# Patient Record
Sex: Female | Born: 1955 | Race: White | Hispanic: No | Smoking: Former smoker
Health system: Southern US, Community
[De-identification: ages and names within clinical notes are randomized; demographics above are authoritative.]

## PROBLEM LIST (undated history)

## (undated) DIAGNOSIS — F32A Depression, unspecified: Secondary | ICD-10-CM

## (undated) DIAGNOSIS — I10 Essential (primary) hypertension: Secondary | ICD-10-CM

## (undated) DIAGNOSIS — F419 Anxiety disorder, unspecified: Secondary | ICD-10-CM

## (undated) DIAGNOSIS — K219 Gastro-esophageal reflux disease without esophagitis: Secondary | ICD-10-CM

## (undated) DIAGNOSIS — E785 Hyperlipidemia, unspecified: Secondary | ICD-10-CM

## (undated) DIAGNOSIS — C801 Malignant (primary) neoplasm, unspecified: Secondary | ICD-10-CM

## (undated) DIAGNOSIS — F329 Major depressive disorder, single episode, unspecified: Secondary | ICD-10-CM

## (undated) HISTORY — PX: THUMB ARTHROSCOPY: SHX2509

## (undated) HISTORY — PX: OTHER SURGICAL HISTORY: SHX169

## (undated) HISTORY — DX: Gastro-esophageal reflux disease without esophagitis: K21.9

## (undated) HISTORY — DX: Depression, unspecified: F32.A

## (undated) HISTORY — PX: DILATION AND CURETTAGE OF UTERUS: SHX78

## (undated) HISTORY — DX: Hyperlipidemia, unspecified: E78.5

## (undated) HISTORY — PX: BREAST ENHANCEMENT SURGERY: SHX7

## (undated) HISTORY — DX: Anxiety disorder, unspecified: F41.9

## (undated) HISTORY — PX: TONSILLECTOMY: SUR1361

## (undated) HISTORY — PX: COLONOSCOPY: SHX174

## (undated) HISTORY — DX: Malignant (primary) neoplasm, unspecified: C80.1

## (undated) HISTORY — PX: APPENDECTOMY: SHX54

## (undated) HISTORY — DX: Essential (primary) hypertension: I10

## (undated) HISTORY — DX: Major depressive disorder, single episode, unspecified: F32.9

---

## 2000-12-04 HISTORY — PX: UPPER GASTROINTESTINAL ENDOSCOPY: SHX188

## 2003-10-02 ENCOUNTER — Other Ambulatory Visit: Admission: RE | Admit: 2003-10-02 | Discharge: 2003-10-02 | Payer: Self-pay | Admitting: Obstetrics and Gynecology

## 2004-10-13 ENCOUNTER — Other Ambulatory Visit: Admission: RE | Admit: 2004-10-13 | Discharge: 2004-10-13 | Payer: Self-pay | Admitting: Obstetrics and Gynecology

## 2004-12-16 ENCOUNTER — Encounter (INDEPENDENT_AMBULATORY_CARE_PROVIDER_SITE_OTHER): Payer: Self-pay | Admitting: *Deleted

## 2004-12-16 ENCOUNTER — Ambulatory Visit (HOSPITAL_COMMUNITY): Admission: RE | Admit: 2004-12-16 | Discharge: 2004-12-16 | Payer: Self-pay | Admitting: Obstetrics and Gynecology

## 2005-01-27 ENCOUNTER — Ambulatory Visit: Payer: Self-pay | Admitting: Cardiology

## 2005-02-05 ENCOUNTER — Encounter: Admission: RE | Admit: 2005-02-05 | Discharge: 2005-02-05 | Payer: Self-pay | Admitting: Orthopedic Surgery

## 2005-03-10 ENCOUNTER — Ambulatory Visit: Payer: Self-pay | Admitting: Cardiology

## 2005-08-24 ENCOUNTER — Encounter (INDEPENDENT_AMBULATORY_CARE_PROVIDER_SITE_OTHER): Payer: Self-pay | Admitting: Specialist

## 2005-08-24 ENCOUNTER — Ambulatory Visit (HOSPITAL_COMMUNITY): Admission: RE | Admit: 2005-08-24 | Discharge: 2005-08-24 | Payer: Self-pay | Admitting: Gastroenterology

## 2005-10-17 ENCOUNTER — Inpatient Hospital Stay (HOSPITAL_COMMUNITY): Admission: EM | Admit: 2005-10-17 | Discharge: 2005-10-18 | Payer: Self-pay | Admitting: Emergency Medicine

## 2005-10-25 ENCOUNTER — Other Ambulatory Visit: Admission: RE | Admit: 2005-10-25 | Discharge: 2005-10-25 | Payer: Self-pay | Admitting: Obstetrics and Gynecology

## 2006-03-06 ENCOUNTER — Ambulatory Visit: Payer: Self-pay | Admitting: Cardiology

## 2006-03-07 ENCOUNTER — Ambulatory Visit: Payer: Self-pay | Admitting: Cardiology

## 2007-03-19 ENCOUNTER — Ambulatory Visit: Payer: Self-pay | Admitting: Cardiology

## 2007-03-21 ENCOUNTER — Ambulatory Visit: Payer: Self-pay | Admitting: Cardiology

## 2007-03-21 LAB — CONVERTED CEMR LAB
ALT: 26 units/L (ref 0–40)
AST: 31 units/L (ref 0–37)
Albumin: 4.1 g/dL (ref 3.5–5.2)
Alkaline Phosphatase: 50 units/L (ref 39–117)
Bilirubin, Direct: 0.2 mg/dL (ref 0.0–0.3)
Cholesterol: 146 mg/dL (ref 0–200)
HDL: 52.4 mg/dL (ref >39.0)
LDL Cholesterol: 80 mg/dL (ref 0–99)
Total Bilirubin: 1.4 mg/dL — ABNORMAL HIGH (ref 0.3–1.2)
Total CHOL/HDL Ratio: 2.8
Total Protein: 7.1 g/dL (ref 6.0–8.3)
Triglycerides: 66 mg/dL (ref 0–149)
VLDL: 13 mg/dL (ref 0–40)

## 2008-07-02 ENCOUNTER — Ambulatory Visit: Payer: Self-pay | Admitting: Cardiology

## 2008-07-02 LAB — CONVERTED CEMR LAB
ALT: 16 units/L (ref 0–35)
AST: 25 units/L (ref 0–37)
Albumin: 4.3 g/dL (ref 3.5–5.2)
Alkaline Phosphatase: 47 units/L (ref 39–117)
Bilirubin, Direct: 0.2 mg/dL (ref 0.0–0.3)
Cholesterol: 128 mg/dL (ref 0–200)
HDL: 48.5 mg/dL (ref >39.0)
LDL Cholesterol: 68 mg/dL (ref 0–99)
Total Bilirubin: 1.7 mg/dL — ABNORMAL HIGH (ref 0.3–1.2)
Total CHOL/HDL Ratio: 2.6
Total Protein: 7 g/dL (ref 6.0–8.3)
Triglycerides: 59 mg/dL (ref 0–149)
VLDL: 12 mg/dL (ref 0–40)

## 2009-04-19 ENCOUNTER — Encounter (INDEPENDENT_AMBULATORY_CARE_PROVIDER_SITE_OTHER): Payer: Self-pay | Admitting: *Deleted

## 2009-05-04 ENCOUNTER — Telehealth: Payer: Self-pay | Admitting: Cardiology

## 2009-06-25 DIAGNOSIS — E785 Hyperlipidemia, unspecified: Secondary | ICD-10-CM | POA: Insufficient documentation

## 2009-07-02 ENCOUNTER — Ambulatory Visit: Payer: Self-pay | Admitting: Cardiology

## 2009-09-03 ENCOUNTER — Telehealth (INDEPENDENT_AMBULATORY_CARE_PROVIDER_SITE_OTHER): Payer: Self-pay | Admitting: *Deleted

## 2009-09-03 ENCOUNTER — Encounter (INDEPENDENT_AMBULATORY_CARE_PROVIDER_SITE_OTHER): Payer: Self-pay | Admitting: *Deleted

## 2010-06-01 ENCOUNTER — Telehealth: Payer: Self-pay | Admitting: Cardiology

## 2010-06-29 ENCOUNTER — Ambulatory Visit: Payer: Self-pay | Admitting: Cardiology

## 2010-07-04 ENCOUNTER — Ambulatory Visit: Payer: Self-pay | Admitting: Cardiology

## 2010-07-06 LAB — CONVERTED CEMR LAB
ALT: 18 units/L (ref 0–35)
AST: 23 units/L (ref 0–37)
Albumin: 4.4 g/dL (ref 3.5–5.2)
Alkaline Phosphatase: 56 units/L (ref 39–117)
Bilirubin, Direct: 0.3 mg/dL (ref 0.0–0.3)
Cholesterol: 121 mg/dL (ref 0–200)
HDL: 47 mg/dL (ref >39.00)
LDL Cholesterol: 62 mg/dL (ref 0–99)
Total Bilirubin: 1.9 mg/dL — ABNORMAL HIGH (ref 0.3–1.2)
Total CHOL/HDL Ratio: 3
Total Protein: 7 g/dL (ref 6.0–8.3)
Triglycerides: 60 mg/dL (ref 0.0–149.0)
VLDL: 12 mg/dL (ref 0.0–40.0)

## 2010-12-24 ENCOUNTER — Encounter: Payer: Self-pay | Admitting: Internal Medicine

## 2011-01-01 LAB — CONVERTED CEMR LAB
ALT: 15 units/L (ref 0–35)
AST: 20 units/L (ref 0–37)
Albumin: 4.7 g/dL (ref 3.5–5.2)
Alkaline Phosphatase: 54 units/L (ref 39–117)
Bilirubin, Direct: 0.5 mg/dL — ABNORMAL HIGH (ref 0.0–0.3)
Cholesterol: 119 mg/dL (ref 0–200)
HDL: 57 mg/dL (ref >39)
Indirect Bilirubin: 1.3 mg/dL — ABNORMAL HIGH (ref 0.0–0.9)
LDL Cholesterol: 51 mg/dL (ref 0–99)
Total Bilirubin: 1.8 mg/dL — ABNORMAL HIGH (ref 0.3–1.2)
Total CHOL/HDL Ratio: 2.1
Total Protein: 7.6 g/dL (ref 6.0–8.3)
Triglycerides: 55 mg/dL (ref <150)
VLDL: 11 mg/dL (ref 0–40)

## 2011-01-05 NOTE — Assessment & Plan Note (Signed)
Summary: F 12 MONTHS   Visit Type:  1 yr f/u Primary Provider:  Clydell Hakim  CC:  no cardiac complaints today.  History of Present Illness: Mrs Hummel returns today for evaluation and management of her hyperlipidemia and family history of coronary disease.  She continues on Vytorin. Her numbers of her HDL.  She's having no angina or ischemic symptoms. Her biggest concern is that she continues to slowly gain weight. She's gained 4 pounds since last year prior records.  She exercises regularly except in the heat. She is very careful with her diet was reviewed at length today. She is very compliant with her medications.  Current Medications (verified): 1)  Vytorin 10-20 Mg Tabs (Ezetimibe-Simvastatin) .Marland Kitchen.. 1 Tab At Bedtime 2)  Fish Oil 1000 Mg Caps (Omega-3 Fatty Acids) .... 2 Caps Once Daily 3)  Glucosamine Sulfate 500 Mg Tabs (Glucosamine Sulfate) .... 2 Tab Once Daily 4)  Vivelle-Dot 0.05 Mg/24hr Pttw (Estradiol) .... Change Patch 2 X Weekly 5)  Prometrium 100 Mg Caps (Progesterone Micronized) .Marland Kitchen.. 1 Cap Once Daily 6)  Citracal/vitamin D 250-200 Mg-Unit Tabs (Calcium Citrate-Vitamin D) .... 2 Tab Once Daily 7)  Iron 28 Mg Tabs (Ferrous Sulfate) .Marland Kitchen.. 1 Tab Once Daily 8)  Biotin 5000 5 Mg Caps (Biotin) .Marland Kitchen.. 1 Cap Once Daily 9)  Prilosec Otc 20 Mg Tbec (Omeprazole Magnesium) .Marland Kitchen.. 1 Tab Once Daily  Allergies: 1)  Codeine Phosphate (Codeine Phosphate) 2)  Darvocet A500 3)  Percocet (Oxycodone-Acetaminophen) 4)  Sulfamethoxazole (Sulfamethoxazole)  Review of Systems       negative history of present illness  Vital Signs:  Patient profile:   55 year old female Height:      65 inches Weight:      121 pounds BMI:     20.21 Pulse rate:   57 / minute Pulse rhythm:   irregular BP sitting:   116 / 62  (left arm) Cuff size:   large  Vitals Entered By: Danielle Rankin, CMA (June 29, 2010 3:24 PM)  Physical Exam  General:  Well developed, well nourished, in no acute  distress. Head:  normocephalic and atraumatic Eyes:  PERRLA/EOM intact; conjunctiva and lids normal. Neck:  Neck supple, no JVD. No masses, thyromegaly or abnormal cervical nodes. Chest Karie Skowron:  no deformities or breast masses noted Heart:  Non-displaced PMI, chest non-tender; regular rate and rhythm, S1, S2 without murmurs, rubs or gallops. Carotid upstroke normal, no bruit. Normal abdominal aortic size, no bruits. Femorals normal pulses, no bruits. Pedals normal pulses. No edema, no varicosities. Abdomen:  Bowel sounds positive; abdomen soft and non-tender without masses, organomegaly, or hernias noted. No hepatosplenomegaly. Msk:  Back normal, normal gait. Muscle strength and tone normal. Pulses:  pulses normal in all 4 extremities Extremities:  No clubbing or cyanosis. Neurologic:  Alert and oriented x 3. Skin:  Intact without lesions or rashes. Psych:  Normal affect.   EKG  Procedure date:  06/29/2010  Findings:      sinus bradycardia, poor R. progression and recording, no change since last year.  Impression & Recommendations:  Problem # 1:  HYPERLIPIDEMIA-MIXED (ICD-272.4) She is due annual blood work. We'll arrange for fasting lipids and LFTs. Her updated medication list for this problem includes:    Vytorin 10-20 Mg Tabs (Ezetimibe-simvastatin) .Marland Kitchen... 1 tab at bedtime  Problem # 2:  CORONARY ARTERY DISEASE, FAMILY HX (ICD-V17.3)  Patient Instructions: 1)  Your physician recommends that you schedule a follow-up appointment in: 12 months with Dr Daleen Squibb 2)  Your physician recommends that you return for lab work fasting lipid, and liver panel 272.4 3)  Your physician recommends that you continue on your current medications as directed. Please refer to the Current Medication list given to you today.

## 2011-01-05 NOTE — Progress Notes (Signed)
Summary: Questions about labs  Phone Note Call from Patient Call back at Work Phone [phone]   Caller: Patient Summary of Call: Pt have question about getting labs drawn before appt in July Initial call taken by: Judie Grieve,  June 01, 2010 9:58 AM  Follow-up for Phone Call        Spoke with pt. Patient  would like to know if she needs to come to the office for labs prior appointment with Dr. Daleen Squibb. I let pt. know Dr. Mozetta Murfin did not order any labs to be done prior appointment. He usually evaluate the pt. first, then he will decide if labs are needed. Pt. verbalized understanding. Follow-up by: Ollen Gross, RN, BSN,  June 01, 2010 10:59 AM

## 2011-04-18 NOTE — Assessment & Plan Note (Signed)
[facility]                            CARDIOLOGY OFFICE NOTE   [name], [name]                      MRN:          [id number]  DATE:07/02/2008                            DOB:          08-10-1956    HISTORY OF PRESENT ILLNESS:  [name] returns today for further  management of her mixed hyperlipidemia.  She also has a very strong  family history of coronary artery disease.   She is extremely compliant and is taking fish oil 2000 mg a day, Vytorin  10/20 at bedtime.   She cannot exercise as much as she is used, because of her knees.   She has no complaints today, specifically denied any chest pain,  shortness of breath, dyspnea on exertion, orthopnea, PND, tachy  palpitations.   PHYSICAL EXAMINATION:  VITAL SIGNS:  Blood pressure is 95/66, her pulse  is 58 and regular.  Her weight is 115 down 2.  HEENT:  Unremarkable, unchanged.  NECK:  Carotids upstrokes are equal bilaterally without bruits, no JVD.  Thyroid is not enlarged.  Trachea is midline.  LUNGS:  Clear.  HEART:  Reveals regular rate and rhythm.  ABDOMEN:  Soft, good bowel sounds.  No midline bruit.  No hepatomegaly.  EXTREMITIES:  No cyanosis, clubbing, or edema.  Pulses are intact.  NEURO:  Exam is intact.   EKG is remarkable for sinus bradycardia with no acute changes.   ASSESSMENT AND PLAN:  [name] is doing remarkably well.  She is due  for fasting lipids and LFTs.  Her last numbers reviewed, which were  excellent.  I have made no changes in medical program. At this time, she  is doing well.  I will see her back in a year.     Thomas C. Daleen Squibb, MD, [facility]  Electronically Signed    TCW/MedQ  DD: 07/02/2008  DT: 07/03/2008  Job #: [id number]   cc:   Guy Sandifer. Henderson Cloud, M.D.

## 2011-04-21 NOTE — H&P (Signed)
NAMEJINA, [name]               ACCOUNT NO.:  [id number]   MEDICAL RECORD NO.:  [id number]          PATIENT TYPE:  AMB   LOCATION:  SDC                           FACILITY:  [facility]   PHYSICIAN:  Guy Sandifer. Tomblin II, M.D.DATE OF BIRTH:  November 24, 1956   DATE OF ADMISSION:  12/16/2004  DATE OF DISCHARGE:                                HISTORY & PHYSICAL   CHIEF COMPLAINT:  Postmenopausal bleeding.   HISTORY OF PRESENT ILLNESS:  This patient is a 55 year old married white  female, G0, P0, who was amenorrheic from March until October with daily  vasomotor symptoms.  She did have extremely heavy bleeding in October.  Ultrasound on November 07, 2004, revealed the uterus measuring 5.9 x 2.5 x  3.4 cm.  There is a 9.5 mm submucosal fibroid and a 6 mm intramural fibroid  noted.  Both ovaries appear normal.  Sonohysterogram reveals thickening on  the posterior endometrial wall with no definite polyps.  Hysteroscopy D&C  has been discussed with the patient.  Potential risks and complications are  reviewed preoperatively.   PAST MEDICAL HISTORY:  Irritable bowel syndrome.   PAST SURGICAL HISTORY:  1.  Tonsillectomy.  2.  Sinus surgery.  3.  Cyst surgery.  4.  Mandibular osteotomy.  5.  Bilateral breast augmentation.  6.  Appendectomy.   OBSTETRIC HISTORY:  Negative.   MEDICATIONS:  1.  Glucosamine daily.  2.  Zelnorm p.r.n.  3.  Voltaren p.r.n.  4.  Citracal p.r.n.   ALLERGIES:  CODEINE, SULFA, DARVOCET, leading to nausea and vomiting.   FAMILY HISTORY:  Breast cancer maternal aunt.  Heart disease in father.  Emphysema in mother.  Cerebrovascular accident in father.   REVIEW OF SYMPTOMS:  NEUROLOGIC:  Denies headache.  CARDIAC:  Denies chest  pain.  PULMONARY:  Denies shortness of breath.  GI:  Irritable bowel  syndrome as above.   PHYSICAL EXAMINATION:  VITAL SIGNS:  Height 5 feet 5 inches, weight 117.5  pounds, blood pressure 110/72.  HEENT:  Without thyromegaly.  LUNGS:  Clear to  auscultation.  HEART:  Regular rate and rhythm.  BACK:  Without CVA tenderness.  BREASTS:  Status post augmentation without mass, retractions, or discharge  bilaterally.  ABDOMEN:  Soft, nontender, without masses.  PELVIC:  Vulva, vagina, cervix without lesion.  Cervix is slightly stenotic.  Uterus is normal size, mobile, and nontender.  Adnexa nontender without  masses.  EXTREMITIES/NEUROLOGIC:  Grossly within normal limits.   ASSESSMENT:  Postmenopausal bleeding.   PLAN:  Hysteroscopy, possible resectoscope, dilatation curettage.      JET/MEDQ  D:  12/14/2004  T:  12/14/2004  Job:  [id number]

## 2011-04-21 NOTE — H&P (Signed)
[name], [name]               ACCOUNT NO.:  [id number]   MEDICAL RECORD NO.:  [id number]          PATIENT TYPE:  EMS   LOCATION:  MAJO                         FACILITY:  [facility]   PHYSICIAN:  Jonna L. Robb Matar, M.D.DATE OF BIRTH:  [birth year]   DATE OF ADMISSION:  10/16/2005  DATE OF DISCHARGE:                                HISTORY & PHYSICAL   PRIMARY CARE PHYSICIAN:  Unassigned.   GASTROENTEROLOGIST:  Shirley Friar, M.D.   CHIEF COMPLAINT:  Dizziness.   HISTORY:  This 55 year old Caucasian female was working in her garden when  at 1:15 in the afternoon all of a sudden she got extremely dizzy, so much so  that she almost passed out and had to sit down; she could not stand up.  It  was accompanied by slight tinnitus, mild blurred vision, nausea, and a  bitemporal headache, numbness and tingling in her hands, mild confusion.  Most of the symptoms have cleared, but the nausea is still persisting, and  her vision is still slightly off.   ALLERGIES:  1.  CODEINE gives her vomiting and shortness of breath.  2.  SULFA makes her nauseous.   MEDICATIONS:  1.  Vivelle 0.05 mg daily.  2.  Prometrium 100 mg daily.  3.  Vytorin 10/20 daily.  4.  AcipHex 20 daily.  5.  NuLev 0.125 mg.   PAST MEDICAL HISTORY:  1.  Hypercholesterolemia.  2.  GERD.  This has been bad enough that she has had to take the NuLev for      spasms in her throat from the reflux.  She has tried other preparations,      but both H2 blockers and most of the other PPIs give her nausea and      vomiting.  AcipHex is the only one that she can tolerate.  3.  Endometriosis.   OPERATIONS:  1.  Appendectomy.  2.  Tonsillectomy.  3.  Breast augmentation.  4.  Maxillofacial surgery for TMJ.   FAMILY HISTORY:  CVA and hypercholesterolemia.   SOCIAL HISTORY:  The patient is married; no children.  Works in an [employer].   REVIEW OF SYSTEMS:  No fever, chills, or runny nose.  No previous  vision  problems.  No coughing, wheezing, or lung problems.  No chest pain.  No  vomiting, diarrhea, hematemesis, melena.  No urinary frequency.  No diabetes  or thyroid problems.  No unusual anemia, cancer, bleeding, or skin problems.  No arthritis.  No previous history of seizures, CVA, TIA, migraine.  No  psychiatric problems.   PHYSICAL EXAMINATION:  VITAL SIGNS:  Temperature 98.4, pulse 76,  respirations 17, blood pressure 123/79.  GENERAL:  This is a well-developed Caucasian white female lying in bed.  HEENT:  Conjunctivae and lids are normal.  Pupils reactive.  Extraocular  movements show some very mild nystagmus.  Normal hearing, mucosa, and  pharynx.  NECK:  No masses, thyromegaly, or carotid bruits.  RESPIRATORY:  The respiratory effort is normal.  The lungs are clear  auscultation and percussion without wheezing, rales, rhonchi, or  dullness.  HEART:  Regular rate and rhythm.  Normal S1 and S2 without murmurs, rubs, or  gallops.  Pulses are without bruits.  No clubbing, cyanosis, or edema.  BREASTS:  Bilateral augmentation.  No tenderness or discharge.  ABDOMEN:  Nontender.  Normal bowel sounds.  No hepatosplenomegaly or  hernias.  PELVIC:  External genitalia is normal.  NEUROLOGIC:  No adenopathy.  Full range of motion and full strength in all 4  extremities.  Normal finger-to-nose.  No skin rashes.  Normal sensation.  The patient is alert and oriented x3.  The memory and judgment seem to be  intact, a little anxious.   LABORATORY DATA:  Hyperventilation with a pH of 7.516 and a PCO2 of 29.  EKG  showed sinus bradycardia.  CBC was normal.  Cardiac enzymes are negative.  CT scan of the head is negative.   IMPRESSION:  1.  Vertigo with accompanying symptoms.  It is possibly an acute      labyrinthitis, but need to rule out a cerebrovascular accident or a      transient ischemic attack, so will get an MRI and MRA in the morning.  2.  Hypercholesterolemia.  3.   Endometriosis.  4.  Gastroesophageal reflux disease.      Jonna L. Robb Matar, M.D.  Electronically Signed     JLB/MEDQ  D:  10/17/2005  T:  10/17/2005  Job:  [id number]

## 2011-04-21 NOTE — Op Note (Signed)
[name], [name]               ACCOUNT NO.:  [id number]   MEDICAL RECORD NO.:  [id number]          PATIENT TYPE:  AMB   LOCATION:  SDC                           FACILITY:  [facility]   PHYSICIAN:  Guy Sandifer. Tomblin II, M.D.DATE OF BIRTH:  [birth year]   DATE OF PROCEDURE:  12/16/2004  DATE OF DISCHARGE:                                 OPERATIVE REPORT   PREOPERATIVE DIAGNOSIS:  Postmenopausal bleeding.   POSTOPERATIVE DIAGNOSIS:  Postmenopausal bleeding.   PROCEDURES:  1.  Hysteroscopy, dilatation and curettage.  2.  1% Xylocaine paracervical block.   SURGEON:  Guy Sandifer. Henderson Cloud, M.D.   ANESTHESIA:  MAC.   SPECIMENS:  Endometrial curettings.   I&O of sorbitol distending media 40 mL deficit.   INDICATIONS AND CONSENT:  This patient is a 55 year old married white  female, G0, P0, amenorrheic from March to October with daily vasomotor  symptoms.  She then had an episode of heavy bleeding in October.  After  discussing options with the patient, hysteroscopy, dilatation and curettage  is discussed. The potential risks and complications are reviewed  preoperatively, including but not limited to infection, uterine perforation,  bowel, bladder or ureteral damage, bleeding requiring transfusion of blood  products and possible transfusion reaction, HIV and hepatitis acquisition,  DVT, PE, pneumonia, laparoscopy, laparotomy, hysterectomy, recurrent  abnormal bleeding.  All questions are answered, and consent is signed on the  chart.   FINDINGS:  The cervix is extremely stenotic, and the uterine cavity is  sharply anteverted.  There was approximately an 8-9 mm submucosal fibroid on  the posterior endometrial canal to the left of midline.  No other structures  were noted.   PROCEDURE:  The patient was taken to the operating room, where she is  identified and placed in the dorsal supine position and intravenous  anesthesia is administered.  She is then placed in the dorsal lithotomy  position, where she is prepped, the bladder is straight-catheterized, and  she is draped in the sterile fashion.  Examination reveals the uterus to be  anteverted.  A bivalve speculum was placed in the vagina and the anterior  cervical lip was injected with 1% Xylocaine.  A paracervical block is then  placed in the 2, 4, 5, 7, 8, and 10 o'clock positions with approximately 20  mL total 1% plain Xylocaine.  The external os is quite stenotic and requires  lacrimal duct dilators to adequately dilate up to a 23 Pratt dilator.  The  diagnostic hysteroscope is then placed in the endocervical canal and  advanced under direct visualization using sorbitol distending medium.  The  internal cervical os cannot be traversed at this point because of the sharp  forward anteflexion of the uterine cavity.  The hysteroscope is then  withdrawn and the cervix is dilated to a 29 Pratt dilator.  The diagnostic  hysteroscope is then reintroduced, and this allows adequate passage under  direct visualization.  The above findings are noted.  Hysteroscope is  withdrawn and sharp curettage is carried out.  The internal cervical os is  still sharply angulated and narrow, and it  is difficult to remove the narrow  curette from the endometrial canal.  Reinspection with the hysteroscope  reveals that the small submucosal fibroid remains and no other abnormal  structure is noted.  Due to the small, narrow cervix, it is felt that the  passage of a resectoscope would be highly likely to cause injury, and  therefore this is not attempted.  Instruments are removed.  Good hemostasis  is noted.  All counts are correct, and the patient is awakened and taken to  the recovery room in stable condition.      JET/MEDQ  D:  12/16/2004  T:  12/16/2004  Job:  [id number]

## 2011-04-21 NOTE — Discharge Summary (Signed)
[name], [name] NO.:  [id number]   MEDICAL RECORD NO.:  [id number]          PATIENT TYPE:  INP   LOCATION:  2020                         FACILITY:  [facility]   PHYSICIAN:  Isidor Holts, M.D.  DATE OF BIRTH:  1956/09/11   DATE OF ADMISSION:  10/16/2005  DATE OF DISCHARGE:  10/18/2005                                 DISCHARGE SUMMARY   DISCHARGE DIAGNOSES:  1.  Acute labyrinthitis.  2.  Dyslipidemia.  3.  Gastroesophageal reflux disease.  4.  History of endometriosis.  5.  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   DISCHARGE MEDICATIONS:  1.  Vivelle 0.05 mg p.o. daily.  2.  Prometrium 100 mg p.o. daily.  3.  Vytorin (10/20) 1 p.o. daily.  4.  Aciphex 20 mg p.o. daily.  5.  NuLev 0.125 mg p.o. daily.  6.  Aspirin, enteric coated, 81 mg p.o. daily.  7.  Antivert 25 mg p.o. p.r.n. q.8 h. for vertigo for 5 days only.   PROCEDURES:  1.  Head CT scan dated October 16, 2005: This was a negative noncontrast      head CT.  2.  Brain MRI/MRA dated October 17, 2005:  This showed no acute ischemia.      There was a conglomerate of enhancing blood vessel in the right frontal      supraorbital region suspicious of avascular anomaly such as a venous      angioma, possibly an AVM or dural A-V fistula.  Angiogram recommended.      Signal drop off __________ of the right vertebrobasilar junction which      may represent a significant stenosis.  No evidence of intracranial      aneurysm is noted.  3.  Carotid and vertebral artery duplex dated October 18, 2005: This showed      mild heterogenous plaque.  Also velocities indicate 40 to 60% ICA      stenosis; however, plaque morphology does not support increasing      velocities.  4.  Cerebral arteriogram October 18, 2005:  This showed small right frontal      venous angioma, no arteriovenous malformation, A-V shunting, or aneurysm      seen.   CONSULTATIONS:  Sanjeev K.  Corliss Skains, M.D., interventional radiology.   ADMISSION HISTORY:  As in H&P note of October 16, 2005.  However, in brief,  this is a 55 year old female, with known history of dyslipidemia, GERD,  endometriosis, who presents with sudden onset vertigo accompanied by slight  tinnitus, mildly blurred vision, nausea, and bitemporal headache.  She was  admitted for evaluation, investigation, and management.   CLINICAL COURSE:  #1.  ACUTE VERTIGO ASSOCIATED WITH TINNITUS AND NAUSEA: Initial physical  examination was quite unremarkable apart from mild nystagmus, which resolved  shortly thereafter.  The patient had no focal neurologic deficits.  Brain CT  scan done on October 17, 2005, showed no acute intracranial problems.  Followup brain MRI, showed no acute intracranial abnormalities, although  there was suspicion of avascular abnormality in the right frontal  region.  Dr. Corliss Skains, interventional radiology, was consulted who performed  cerebral arteriogram on October 18, 2005.  This showed a small right  frontal venous angioma, with no AVM or aneurysm.  This is considered a  benign lesion and not concerning.  Bilateral carotid and vertebral artery  duplex scan showed mild heterogenous plaque throughout and no significant  stenosis.  The patient was managed with intravenous fluid hydration,  Antivert p.r.n., and also given simple analgesics for headache, with  complete amelioration of symptoms.  It is felt that patient's presentation  was consistent with an attack of acute labyrinthitis, which is expected to  be self limiting in nature, although there is no history of recent upper  respiratory tract infection.  The patient has been reassured accordingly.   #2.  GASTROESOPHAGEAL REFLUX DISEASE:  The patient does have a history of  GERD.  She was managed with proton pump inhibitor treatment during the  course of her hospital stay and experienced no symptoms referable to this.   #3.  HISTORY  OF DYSLIPIDEMIA:  The patient was on Vytorin at the time of  presentation. She was continued on this during the course of her hospital  stay.  Lipid profile dated October 17, 2005, showed total cholesterol 129,  triglycerides 70, HDL 39, LDL 76; i.e., excellent lipid profile.  The  patient has been, therefore, reassured accordingly.   DISPOSITION:  The patient was discharged in satisfactory condition on  October 18, 2005.   DIET:  Health heart.   ACTIVITY:  As tolerated.  She is recommended to take things easy for the  next 3 days.   WOUND CARE:  No driving for 2 days, may shower by 10/19/2005. Apply bandaid  to right groin puncture wound.   PAIN MANAGEMENT:  Not applicable.   FOLLOW UP:  The patient is to follow up routinely with her  gastroenterologist, Dr. Shirley Friar.  In addition, she has been  recommended to establish a primary care physician for routine preventive  care.  All this was explained to her.  She has verbalized understanding.  Patient may return to regular duties on 10/23/2005.      Isidor Holts, M.D.  Electronically Signed     CO/MEDQ  D:  10/18/2005  T:  10/18/2005  Job:  [id number]   cc:   Shirley Friar, MD  Fax: [phone]

## 2011-04-21 NOTE — Assessment & Plan Note (Signed)
[facility]                            CARDIOLOGY OFFICE NOTE   [name], [name]                      MRN:          [id number]  DATE:03/19/2007                            DOB:          May 28, 1956    SUBJECTIVE:  [name] comes in today for further management of mixed  hyperlipidemia.  She continues on the Vytorin 10/20 and an enteric-  coated aspirin 81 mg q.d.  She also takes fish oil 2 grams a day.  She  is due blood work.  She has no complaints of angina or ischemia.  She is  walking about seven to eight miles a week with her dog.   PHYSICAL EXAMINATION:  VITAL SIGNS:  Blood pressure today 100/78, pulse  60 and regular.  Weight is 117 pounds.  Her electrocardiogram is normal.  HEENT:  Normocephalic and atraumatic.  Pupils equal, round, reactive to  light and accommodation.  Extraocular movements intact.  Sclerae clear.  Facial symmetry is normal.  Dentition satisfactory.  NECK:  Carotid upstrokes are equal bilaterally without bruits.  There is  no jugular venous distention.  Thyroid is not enlarged.  Trachea is  midline.  LUNGS:  Clear.  HEART:  A regular rate and rhythm with nondisplaced PMI.  ABDOMEN:  Soft, good bowel sounds.  EXTREMITIES:  No edema.  Pulses intact.  NEUROLOGIC:  Intact.   Electrocardiogram is normal.   IMPRESSION/PLAN:  [name] is doing remarkably well.  Will have her  come back for a fasting lipids and a liver profile.  I will follow up  with her again in one year.  We renewed her Vytorin today for one year.     Thomas C. Daleen Squibb, MD, [facility]  Electronically Signed    TCW/MedQ  DD: 03/19/2007  DT: 03/19/2007  Job #: [id number]   cc:   Guy Sandifer. Henderson Cloud, M.D.

## 2011-04-21 NOTE — Op Note (Signed)
[name], [name]               ACCOUNT NO.:  [id number]   MEDICAL RECORD NO.:  [id number]          PATIENT TYPE:  AMB   LOCATION:  ENDO                         FACILITY:  [facility]   PHYSICIAN:  Shirley Friar, MDDATE OF BIRTH:  Apr 15, 1956   DATE OF PROCEDURE:  08/24/2005  DATE OF DISCHARGE:                                 OPERATIVE REPORT   INDICATIONS:  Abdominal pain and reflux in the setting of recent  nonsteroidal use.   PREOPERATIVE DIAGNOSIS:  Reflux abdominal pain, nonsteroidal use.   POSTOPERATIVE DIAGNOSIS:  Reflux esophagitis and gastric polyp.   PROCEDURE:  Informed consent was obtained from the patient prior to  initiation of the procedure.  Sedation was given with fentanyl 60 mcg IV and  6 mg of Versed IV.   FINDINGS:  The endoscope was inserted into the oropharynx, which was normal  in appearance.  The esophagus was intubated, and the proximal mid portion of  the esophagus was normal in appearance.  In the distal esophagus was one  linear ulcer that did not join other mucosal folds, consistent with grade A  esophagitis.  The endoscope was inserted further into the stomach and passed  down into the duodenum and the second portion of the duodenum.  Careful  withdrawal of the endoscope revealed second portion of the duodenum and  duodenal bulb.  The endoscope was withdrawn back into the stomach and  revealed normal body of the stomach and retroflexion was done, which  revealed normal incisura angularis and cardia.  In the fundus, a 3 mm  gastric polyp was noted.  This was sessile and was biopsied.  Otherwise, the  fundus was normal.  A small hiatal hernia was also noted over retroflexion.  Antral biopsy for CLO testing was done.  The endoscope was withdrawn and  confirmed the above findings.   DIAGNOSES:  1.  Grade A erosive esophagitis.  2.  Gastric polyp, status post biopsy.  3.  Status post biopsy of the antrum for CLO testing.   PLAN:  1.  Continue  proton pump inhibitor daily with Prevacid SoluTab.  2.  Follow up on CLO test for H. pylori and treat if positive.  3.  Follow up on polyp biopsy.  4.  Avoid NSAIDs.  5.  Follow up in GI clinic in 2-3 months.      Shirley Friar, MD  Electronically Signed     VCS/MEDQ  D:  08/24/2005  T:  08/24/2005  Job:  [id number]

## 2011-07-07 ENCOUNTER — Telehealth: Payer: Self-pay | Admitting: *Deleted

## 2011-07-07 DIAGNOSIS — E785 Hyperlipidemia, unspecified: Secondary | ICD-10-CM

## 2011-07-07 NOTE — Telephone Encounter (Signed)
Pt calls today b/c her insurance requires prior authorization for her vytorin.  She has been on this medication since 2008. She states she changed insurance in January to [employer]. I have called their prior authorization number   [phone].  They are closed. I will call back again next week.  Pt will be out of medication on Tuesday.  I have also made follow-up appt with Dr. Daleen Squibb and fasting lipid appt at the [facility] lab on 08/11/11. Mylo Red RN

## 2011-07-10 NOTE — Telephone Encounter (Signed)
I have filled out prior authorization on Vytorin and will fax. Pt says medication is 165.00/month and her insurance has denied this med each month.   She will pick up Vytorin samples today as she is out of her medication after tonight.   Mylo Red RN

## 2011-07-27 ENCOUNTER — Encounter: Payer: Self-pay | Admitting: Cardiology

## 2011-08-11 ENCOUNTER — Other Ambulatory Visit (INDEPENDENT_AMBULATORY_CARE_PROVIDER_SITE_OTHER): Payer: Self-pay

## 2011-08-11 DIAGNOSIS — E785 Hyperlipidemia, unspecified: Secondary | ICD-10-CM

## 2011-08-11 LAB — LIPID PANEL
Cholesterol: 116 mg/dL (ref 0–200)
LDL Cholesterol: 56 mg/dL (ref 0–99)
Total CHOL/HDL Ratio: 2
Triglycerides: 65 mg/dL (ref 0.0–149.0)
VLDL: 13 mg/dL (ref 0.0–40.0)

## 2011-08-11 LAB — HEPATIC FUNCTION PANEL
ALT: 24 U/L (ref 0–35)
AST: 25 U/L (ref 0–37)
Albumin: 4.1 g/dL (ref 3.5–5.2)
Alkaline Phosphatase: 59 U/L (ref 39–117)
Bilirubin, Direct: 0.2 mg/dL (ref 0.0–0.3)
Total Bilirubin: 0.9 mg/dL (ref 0.3–1.2)
Total Protein: 7.1 g/dL (ref 6.0–8.3)

## 2011-08-15 ENCOUNTER — Encounter: Payer: Self-pay | Admitting: Cardiology

## 2011-08-15 ENCOUNTER — Ambulatory Visit (INDEPENDENT_AMBULATORY_CARE_PROVIDER_SITE_OTHER): Payer: BC Managed Care – PPO | Admitting: Cardiology

## 2011-08-15 VITALS — BP 142/70 | HR 60 | Ht 65.0 in | Wt 120.0 lb

## 2011-08-15 DIAGNOSIS — E785 Hyperlipidemia, unspecified: Secondary | ICD-10-CM

## 2011-08-15 MED ORDER — EZETIMIBE-SIMVASTATIN 10-20 MG PO TABS: 1.0000 | ORAL_TABLET | Freq: Every day | ORAL | Status: DC

## 2011-08-15 NOTE — Progress Notes (Signed)
HPI [name] comes in today for evaluation and management of her hyperlipidemia. Her numbers were quite impressive on September 7. Total cholesterol was 116, triglycerides 65, HDL 47.5, VLDL 13 LDL 56 on Vytorin. Her total cholesterol to HDL ratio was only 2. LFTs were normal.  Continues to walk about 40-45 minutes today. She enjoys yard work. She is just retired.  She has no symptoms of ischemic heart disease or vascular disease. Noncompliant with her meds and diet. Her primary care physician is getting ready to retire. She would like to find one.  EKG shows normal sinus rhythm with no changes. Past Medical History  Diagnosis Date  . Hyperlipidemia   . CAD (coronary artery disease)     Past Surgical History  Procedure Date  . Dilation and curettage of uterus   . Appendectomy   . Tonsillectomy   . Breast enhancement surgery   . Maxillofacial surgery     for TMJ    Family History  Problem Relation Age of Onset  . Stroke Father   . Hyperlipidemia    . Breast cancer Maternal Aunt   . Heart disease Father   . Emphysema Mother     History   Social History  . Marital Status: Married    Spouse Name: N/A    Number of Children: 0  . Years of Education: N/A   Occupational History  . retired    Social History Main Topics  . Smoking status: Former Smoker -- 0.3 packs/day for 4 years    Quit date: 12/04/1992  . Smokeless tobacco: Not on file  . Alcohol Use: 0.0 oz/week     socially  . Drug Use: No  . Sexually Active: Not on file   Other Topics Concern  . Not on file   Social History Narrative  . No narrative on file    Allergies  Allergen Reactions  . Codeine Phosphate     REACTION: unspecified  . Oxycodone-Acetaminophen     REACTION: unspecified  . Propoxyphene N-Acetaminophen     REACTION: unspecified  . Sulfamethoxazole     REACTION: unspecified    Current Outpatient Prescriptions  Medication Sig Dispense Refill  . Biotin (BIOTIN 5000) 5 MG CAPS Take 1  capsule by mouth daily.        . Calcium Citrate-Vitamin D (CITRACAL/VITAMIN D) 250-200 MG-UNIT TABS Take 2 tablets by mouth daily.        Marland Kitchen estradiol (VIVELLE-DOT) 0.05 MG/24HR Change patch 2 times weekly       . ezetimibe-simvastatin (VYTORIN) 10-20 MG per tablet Take 1 tablet by mouth at bedtime.  30 tablet  11  . ferrous gluconate (FERGON) 246 (28 FE) MG tablet Take 246 mg by mouth daily with breakfast.        . fish oil-omega-3 fatty acids 1000 MG capsule Take 1 g by mouth daily.       . Glucosamine Sulfate 500 MG CAPS Take 1 capsule by mouth daily.       . multivitamin-lutein (OCUVITE-LUTEIN) CAPS Take 1 capsule by mouth 2 (two) times daily.        Marland Kitchen omeprazole (PRILOSEC OTC) 20 MG tablet Take 20 mg by mouth daily as needed.       . progesterone (PROMETRIUM) 100 MG capsule Take 100 mg by mouth daily.          ROS Negative other than HPI.   PE General Appearance: well developed, well nourished in no acute distress HEENT: symmetrical face, PERRLA, good dentition  Neck: no JVD, thyromegaly, or adenopathy, trachea midline Chest: symmetric without deformity Cardiac: PMI non-displaced, RRR, normal S1, S2, no gallop or murmur Lung: clear to ausculation and percussion Vascular: all pulses full without bruits  Abdominal: nondistended, nontender, good bowel sounds, no HSM, no bruits Extremities: no cyanosis, clubbing or edema, no sign of DVT, no varicosities  Skin: normal color, no rashes Neuro: alert and oriented x 3, non-focal Pysch: normal affect Filed Vitals:   08/15/11 1225  BP: 142/70  Pulse: 60  Height: 5\' 5"  (1.651 m)  Weight: 120 lb (54.432 kg)    EKG  Labs and Studies Reviewed.   No results found for this basename: WBC, HGB, HCT, MCV, PLT      Chemistry   No results found for this basename: NA, K, CL, CO2, BUN, CREATININE, GLU      Component Value Date/Time   ALKPHOS 59 08/11/2011 0804   AST 25 08/11/2011 0804   ALT 24 08/11/2011 0804   BILITOT 0.9 08/11/2011 0804         Lab Results  Component Value Date   CHOL 116 08/11/2011   CHOL 121 07/04/2010   CHOL 119 07/02/2009   Lab Results  Component Value Date   HDL 47.50 08/11/2011   HDL 47.00 07/04/2010   HDL 57 07/02/2009   Lab Results  Component Value Date   LDLCALC 56 08/11/2011   LDLCALC 62 07/04/2010   LDLCALC 51 07/02/2009   Lab Results  Component Value Date   TRIG 65.0 08/11/2011   TRIG 60.0 07/04/2010   TRIG 55 07/02/2009   Lab Results  Component Value Date   CHOLHDL 2 08/11/2011   CHOLHDL 3 07/04/2010   CHOLHDL 2.1 Ratio 07/02/2009   No results found for this basename: HGBA1C   Lab Results  Component Value Date   ALT 24 08/11/2011   AST 25 08/11/2011   ALKPHOS 59 08/11/2011   BILITOT 0.9 08/11/2011   No results found for this basename: TSH

## 2011-08-15 NOTE — Assessment & Plan Note (Signed)
Excellent numbers. Continue current therapy. I will obtain her a primary care physician.

## 2011-08-15 NOTE — Patient Instructions (Signed)
You have been referred to Dr. Rene Paci as a primary care doctor.  Your physician recommends that you schedule a follow-up appointment as needed with Dr. Daleen Squibb

## 2012-09-01 ENCOUNTER — Other Ambulatory Visit: Payer: Self-pay | Admitting: Cardiology

## 2012-10-01 ENCOUNTER — Encounter: Payer: Self-pay | Admitting: Cardiology

## 2012-10-04 ENCOUNTER — Other Ambulatory Visit: Payer: Self-pay | Admitting: Cardiology

## 2012-10-17 ENCOUNTER — Ambulatory Visit: Payer: BC Managed Care – PPO | Admitting: Cardiology

## 2012-10-18 ENCOUNTER — Ambulatory Visit (INDEPENDENT_AMBULATORY_CARE_PROVIDER_SITE_OTHER): Payer: BC Managed Care – PPO | Admitting: Cardiology

## 2012-10-18 ENCOUNTER — Encounter: Payer: Self-pay | Admitting: Cardiology

## 2012-10-18 VITALS — BP 124/86 | HR 57 | Ht 64.0 in | Wt 119.0 lb

## 2012-10-18 DIAGNOSIS — E782 Mixed hyperlipidemia: Secondary | ICD-10-CM

## 2012-10-18 NOTE — Patient Instructions (Addendum)
Your physician recommends that you continue on your current medications as directed. Please refer to the Current Medication list given to you today.  Your physician recommends that you return to the [facility] lab for fasting Cholesterol and CMP.  Dr. Daleen Squibb has recommended Dr. Rene Paci as primary care.  Your physician wants you to follow-up in: 1 year with Dr. Daleen Squibb. You will receive a reminder letter in the mail two months in advance. If you don't receive a letter, please call our office to schedule the follow-up appointment.

## 2012-10-18 NOTE — Progress Notes (Signed)
HPI Mrs Wyble comes in today for the evaluation and management of mixed hyperlipidemia. She does not have a history of coronary artery disease as outlined her past medical history.  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 She is starting to get out and walk again on a daily basis. She denies any angina or chest discomfort. She's very compliant with medications. Her weight is actually down 1 pound since last year. She has never been overweight.  Past Medical History  Diagnosis Date  . Hyperlipidemia   . CAD (coronary artery disease)     Current Outpatient Prescriptions  Medication Sig Dispense Refill  . Biotin (BIOTIN 5000) 5 MG CAPS Take 1 capsule by mouth daily.        . Calcium Citrate-Vitamin D (CITRACAL/VITAMIN D) 250-200 MG-UNIT TABS Take 2 tablets by mouth daily.        Marland Kitchen estradiol (VIVELLE-DOT) 0.0375 MG/24HR Place 1 patch onto the skin 2 (two) times a week.      . ferrous gluconate (FERGON) 246 (28 FE) MG tablet Take 246 mg by mouth daily with breakfast.        . fish oil-omega-3 fatty acids 1000 MG capsule Take 1 g by mouth daily.       . Glucosamine Sulfate 500 MG CAPS Take 1 capsule by mouth daily.       Marland Kitchen omeprazole (PRILOSEC OTC) 20 MG tablet Take 20 mg by mouth daily as needed.       . progesterone (PROMETRIUM) 100 MG capsule Take 100 mg by mouth daily.        Marland Kitchen VYTORIN 10-20 MG per tablet TAKE ONE TABLET AT BEDTIME.  30 tablet  12    Allergies  Allergen Reactions  . Codeine Phosphate     REACTION: unspecified  . Oxycodone-Acetaminophen     REACTION: unspecified  . Propoxyphene-Acetaminophen     REACTION: unspecified  . Sulfamethoxazole     REACTION: unspecified    Family History  Problem Relation Age of Onset  . Stroke Father   . Hyperlipidemia    . Breast cancer Maternal Aunt   . Heart disease Father   . Emphysema Mother     History   Social History  . Marital Status: Married      Spouse Name: N/A    Number of Children: 0  . Years of Education: N/A   Occupational History  . retired    Social History Main Topics  . Smoking status: Former Smoker -- 0.3 packs/day for 4 years    Quit date: 12/04/1992  . Smokeless tobacco: Not on file  . Alcohol Use: 0.0 oz/week     Comment: socially  . Drug Use: No  . Sexually Active: Not on file   Other Topics Concern  . Not on file   Social History Narrative  . No narrative on file    ROS ALL NEGATIVE EXCEPT THOSE NOTED IN HPI  PE  General Appearance: well developed, well nourished in no acute distress HEENT: symmetrical face, PERRLA, good dentition  Neck: no JVD, thyromegaly, or adenopathy, trachea midline Chest: symmetric without deformity Cardiac: PMI non-displaced, RRR, normal S1, S2, no gallop or murmur Lung: clear to ausculation and percussion Vascular: all pulses full without bruits  Abdominal: nondistended, nontender, good bowel sounds, no HSM, no bruits Extremities: no cyanosis, clubbing or edema, no sign of DVT, no varicosities  Skin: normal color, no rashes Neuro: alert  and oriented x 3, non-focal Pysch: normal affect  EKG Sinus bradycardia, otherwise normal EKG  BMET No results found for this basename: na, k, cl, co2, glucose, bun, creatinine, calcium, gfrnonaa, gfraa    Lipid Panel     Component Value Date/Time   CHOL 116 08/11/2011 0804   TRIG 65.0 08/11/2011 0804   HDL 47.50 08/11/2011 0804   CHOLHDL 2 08/11/2011 0804   VLDL 13.0 08/11/2011 0804   LDLCALC 56 08/11/2011 0804    CBC No results found for this basename: wbc, rbc, hgb, hct, plt, mcv, mch, mchc, rdw, neutrabs, lymphsabs, monoabs, eosabs, basosabs

## 2012-10-18 NOTE — Assessment & Plan Note (Signed)
We'll arrange for a fasting lipids and a comprehensive metabolic profile drawn. Return the office in a year or when necessary. I have asked her again to establish with Dr.Leschber for primary care.

## 2012-10-22 ENCOUNTER — Other Ambulatory Visit (INDEPENDENT_AMBULATORY_CARE_PROVIDER_SITE_OTHER): Payer: BC Managed Care – PPO

## 2012-10-22 ENCOUNTER — Other Ambulatory Visit: Payer: BC Managed Care – PPO

## 2012-10-22 DIAGNOSIS — E782 Mixed hyperlipidemia: Secondary | ICD-10-CM

## 2012-10-22 LAB — BASIC METABOLIC PANEL
GFR: 66.96 mL/min (ref >60.00)
Potassium: 4.2 mEq/L (ref 3.5–5.1)
Sodium: 139 mEq/L (ref 135–145)

## 2012-10-22 LAB — HEPATIC FUNCTION PANEL
ALT: 25 U/L (ref 0–35)
Albumin: 4.3 g/dL (ref 3.5–5.2)
Total Protein: 7.6 g/dL (ref 6.0–8.3)

## 2012-10-22 LAB — LIPID PANEL
Cholesterol: 130 mg/dL (ref 0–200)
HDL: 57.2 mg/dL (ref >39.00)
VLDL: 12.8 mg/dL (ref 0.0–40.0)

## 2013-08-15 ENCOUNTER — Telehealth: Payer: Self-pay | Admitting: Internal Medicine

## 2013-08-15 NOTE — Telephone Encounter (Signed)
Recd records from [facility], Forwarding 14pg to [employer]

## 2013-09-01 ENCOUNTER — Ambulatory Visit (INDEPENDENT_AMBULATORY_CARE_PROVIDER_SITE_OTHER): Payer: BC Managed Care – PPO | Admitting: Internal Medicine

## 2013-09-01 ENCOUNTER — Encounter: Payer: Self-pay | Admitting: Internal Medicine

## 2013-09-01 VITALS — BP 110/68 | HR 62 | Temp 98.4°F | Ht 63.5 in | Wt 102.1 lb

## 2013-09-01 DIAGNOSIS — F410 Panic disorder [episodic paroxysmal anxiety] without agoraphobia: Secondary | ICD-10-CM

## 2013-09-01 DIAGNOSIS — F411 Generalized anxiety disorder: Secondary | ICD-10-CM

## 2013-09-01 DIAGNOSIS — F419 Anxiety disorder, unspecified: Secondary | ICD-10-CM

## 2013-09-01 MED ORDER — DULOXETINE HCL 30 MG PO CPEP: 30.0000 mg | ORAL_CAPSULE | Freq: Every day | ORAL | Status: DC

## 2013-09-01 MED ORDER — ALPRAZOLAM 0.25 MG PO TABS: 0.2500 mg | ORAL_TABLET | Freq: Three times a day (TID) | ORAL | Status: DC | PRN

## 2013-09-01 NOTE — Assessment & Plan Note (Signed)
Severe situational symptoms, specifically by social stressors with spouse. Manifest with severe panic attacks, anorexia and subsequent weight loss. Patient reports she is currently safe, has contacted polic and has 16X restraining order in place Patient acknowledges her social support network and denies SI/HI at this time -contact lines and support provided today After review of potential medication options for treatment, patient agrees to additional medication therapy for support Begin low dose Cymbalta with low dose Xanax when necessary for panic attack symptoms Patient declines ability to enroll in formal counseling at this time but agrees to call here if symptoms worse in the next 2 weeks She also agrees to followup here in the next 2 weeks to continue review and medication support as needed Extensive emotional support provided today

## 2013-09-01 NOTE — Progress Notes (Signed)
Subjective:    Patient ID: [name], female    DOB: 05-03-1956, 57 y.o.   MRN: [id number]  HPI New patient to me, here to establish care  Reports extreme anxiety, associated with fearfulness and panic attacks severe symptoms ongoing for 3 weeks Reports prior milder symptoms of same 20 years ago, current symptoms exacerbated by social stressors with spouse -patient reports she has a restraining order in place and is currently safe Report symptoms manifest with insomnia, anorexia and poor intake, palpitations, sensation of dyspnea, tremor and poor focus Patient acknowledges she has a support system of her sister, niece and church family including daily calls from her minister Patient admits to prior suicidal ideation, but denies intent or plan at this time: "I've move past that" Patient denies prior medication treatment for such condition, denies prior psychiatric evaluation or care  Past Medical History  Diagnosis Date  . Hyperlipidemia   . Acid reflux    Family History  Problem Relation Age of Onset  . Stroke Father   . Hyperlipidemia    . Breast cancer Maternal Aunt   . Heart disease Father   . Emphysema Mother    History  Substance Use Topics  . Smoking status: Former Smoker -- 0.30 packs/day for 4 years    Quit date: 12/04/1992  . Smokeless tobacco: Not on file  . Alcohol Use: 0.0 oz/week     Comment: socially    Review of Systems Constitutional: Negative for fever.  Respiratory: Negative for cough and shortness of breath.   Cardiovascular: Negative for chest pain or palpitations.  Gastrointestinal: Negative for abdominal pain, no bowel changes.  Musculoskeletal: Negative for gait problem or joint swelling.  Skin: Negative for rash.  Neurological: Negative for headache.  No other specific complaints in a complete review of systems (except as listed in HPI above).     Objective:   Physical Exam BP 110/68  Pulse 62  Temp(Src) 98.4 F (36.9 C) (Oral)  Ht 5'  3.5" (1.613 m)  Wt 102 lb 1.9 oz (46.321 kg)  BMI 17.8 kg/m2  SpO2 97% Wt Readings from Last 3 Encounters:  09/01/13 102 lb 1.9 oz (46.321 kg)  10/18/12 119 lb (53.978 kg)  08/15/11 120 lb (54.432 kg)   Constitutional: She is thin and under considerable emotional distress. Tearful, tremulous and very anxious/depressed- HENT: Head: Normocephalic and atraumatic. Ears: B TMs ok, no erythema or effusion; Nose: Nose normal. Mouth/Throat: Oropharynx is clear and moist. No oropharyngeal exudate.  Eyes: Conjunctivae and EOM are normal. Pupils are equal, round, and reactive to light. No scleral icterus.  Neck: Normal range of motion. Neck supple. No JVD present. No thyromegaly present.  Cardiovascular: Normal rate, regular rhythm and normal heart sounds.  No murmur heard. No BLE edema. Pulmonary/Chest: Effort normal and breath sounds normal. No respiratory distress. She has no wheezes.  Abdominal: Soft. Bowel sounds are normal. She exhibits no distension. There is no tenderness. no masses Musculoskeletal: Normal range of motion, no joint effusions. No gross deformities Neurological: She is alert and oriented to person, place, and time. No cranial nerve deficit. Coordination, balance, strength, speech and gait are normal.  Skin: Skin is warm and dry. No rash noted. No erythema.  Psychiatric: She has a very anxious, jittery and nervous mood and affect. Jumpy. Her behavior is fearful. Judgment and thought content impaired.   Lab Results  Component Value Date   GLUCOSE 94 10/22/2012   CHOL 130 10/22/2012   TRIG 64.0 10/22/2012  HDL 57.20 10/22/2012   LDLCALC 60 10/22/2012   ALT 25 10/22/2012   AST 30 10/22/2012   NA 139 10/22/2012   K 4.2 10/22/2012   CL 103 10/22/2012   CREATININE 0.9 10/22/2012   BUN 19 10/22/2012   CO2 29 10/22/2012       Assessment & Plan:   See problem list. Medications and labs reviewed today.  Time spent with patient today 45 minutes, greater than 50% time spent  counseling patient on severe situational anxiety with subsequent panic attacks, subacute weight loss because of same and medication review. Also review of prior records as unavailable with plans for followup in the near future

## 2013-09-01 NOTE — Patient Instructions (Addendum)
It was good to see you today. We have reviewed your prior records including labs and tests today Medications reviewed and updated Start Cymbalta once daily now to help brain chemical balance for anxiety symptoms -  Also use low dose generic Xanax as needed for panic attacks and/or sleep Your prescription(s) have been submitted to your pharmacy. Please take as directed and contact our office if you believe you are having problem(s) with the medication(s). Please schedule followup in 2 weeks, call sooner if problems.   Anxiety and Panic Attacks Your caregiver has informed you that you are having an anxiety or panic attack. There may be many forms of this. Most of the time these attacks come suddenly and without warning. They come at any time of day, including periods of sleep, and at any time of life. They may be strong and unexplained. Although panic attacks are very scary, they are physically harmless. Sometimes the cause of your anxiety is not known. Anxiety is a protective mechanism of the body in its fight or flight mechanism. Most of these perceived danger situations are actually nonphysical situations (such as anxiety over losing a job). CAUSES  The causes of an anxiety or panic attack are many. Panic attacks may occur in otherwise healthy people given a certain set of circumstances. There may be a genetic cause for panic attacks. Some medications may also have anxiety as a side effect. SYMPTOMS  Some of the most common feelings are:  Intense terror.  Dizziness, feeling faint.  Hot and cold flashes.  Fear of going crazy.  Feelings that nothing is real.  Sweating.  Shaking.  Chest pain or a fast heartbeat (palpitations).  Smothering, choking sensations.  Feelings of impending doom and that death is near.  Tingling of extremities, this may be from over-breathing.  Altered reality (derealization).  Being detached from yourself (depersonalization). Several symptoms can be  present to make up anxiety or panic attacks. DIAGNOSIS  The evaluation by your caregiver will depend on the type of symptoms you are experiencing. The diagnosis of anxiety or panic attack is made when no physical illness can be determined to be a cause of the symptoms. TREATMENT  Treatment to prevent anxiety and panic attacks may include:  Avoidance of circumstances that cause anxiety.  Reassurance and relaxation.  Regular exercise.  Relaxation therapies, such as yoga.  Psychotherapy with a psychiatrist or therapist.  Avoidance of caffeine, alcohol and illegal drugs.  Prescribed medication. SEEK IMMEDIATE MEDICAL CARE IF:   You experience panic attack symptoms that are different than your usual symptoms.  You have any worsening or concerning symptoms. Document Released: 11/20/2005 Document Revised: 02/12/2012 Document Reviewed: 03/24/2010 [facility] Patient Information 2014 [place], [place]. Suicidal Feelings, How to Help Yourself Everyone feels sad or unhappy at times, but depressing thoughts and feelings of hopelessness can lead to thoughts of suicide. It can seem as if life is too tough to handle. If you feel as though you have reached the point where suicide is the only answer, it is time to let someone know immediately.  HOW TO COPE AND PREVENT SUICIDE  Let family, friends, teachers, or counselors know. Get help. Try not to isolate yourself from those who care about you. Even though you may not feel sociable, talk with someone every day. It is best if it is face-to-face. Remember, they will want to help you.  Eat a regularly spaced and well-balanced diet.  Get plenty of rest.  Avoid alcohol and drugs because they will only  make you feel worse and may also lower your inhibitions. Remove them from the home. If you are thinking of taking an overdose of your prescribed medicines, give your medicines to someone who can give them to you one day at a time. If you are on  antidepressants, let your caregiver know of your feelings so he or she can provide a safer medicine, if that is a concern.  Remove weapons or poisons from your home.  Try to stick to routines. Follow a schedule and remind yourself that you have to keep that schedule every day.  Set some realistic goals and achieve them. Make a list and cross things off as you go. Accomplishments give a sense of worth. Wait until you are feeling better before doing things you find difficult or unpleasant to do.  If you are able, try to start exercising. Even half-hour periods of exercise each day will make you feel better. Getting out in the sun or into nature helps you recover from depression faster. If you have a favorite place to walk, take advantage of that.  Increase safe activities that have always given you pleasure. This may include playing your favorite music, reading a good book, painting a picture, or playing your favorite instrument. Do whatever takes your mind off your depression.  Keep your living space well-lighted. GET HELP Contact a suicide hotline, crisis center, or local suicide prevention center for help right away. Local centers may include a hospital, clinic, community service organization, social service provider, or health department.  Call your local emergency services (911 in the [place]).  Call a suicide hotline:  1-800-273-TALK ((220) 778-5234) in the [place].  1-800-SUICIDE (630)793-9486) in the [place].  726-270-5412 in the [place] for Spanish-speaking counselors.  2-951-884-1YSA 308 578 4714) in the [place] for TTY users.  Visit the following websites for information and help:  National Suicide Prevention Lifeline: www.suicidepreventionlifeline.org  Hopeline: www.hopeline.com  [employer] for Suicide Prevention: https://www.ayers.com/  For lesbian, gay, bisexual, transgender, or questioning youth, contact The [employer]:  3-557-3-U-KGURKY 867 413 9196) in the [place].  www.thetrevorproject.org  In [place], treatment resources are listed in each province with listings available under [employer] for [employer] or similar titles. Another source for Crisis Centres by [place] is located at http://www.suicideprevention.ca/in-crisis-now/find-a-crisis-centre-now/crisis-centres Document Released: 05/27/2003 Document Revised: 02/12/2012 Document Reviewed: 10/15/2007 [facility] Patient Information 2014 [place], [place].

## 2013-09-02 LAB — HM MAMMOGRAPHY

## 2013-09-03 ENCOUNTER — Telehealth: Payer: Self-pay | Admitting: Cardiology

## 2013-09-03 NOTE — Telephone Encounter (Signed)
Walk in pt Form "Sealed Envelope" Dropped Off gave to [facility] 09/03/13/KM

## 2013-09-04 ENCOUNTER — Encounter: Payer: Self-pay | Admitting: Internal Medicine

## 2013-09-05 ENCOUNTER — Encounter: Payer: Self-pay | Admitting: Internal Medicine

## 2013-09-06 ENCOUNTER — Telehealth: Payer: Self-pay | Admitting: *Deleted

## 2013-09-06 NOTE — Telephone Encounter (Signed)
Pt sent email on Friday stating the cymbalta that md rx makes her very sick in the morning. She has tried to eat with it, but it still makes her very queasy. Also she gets drowsy. The xanax is working much better only had 2 panic attacks and took 1/2 and within 5 minutes she was feeling better. Wanting to ask md any suggestion concerning the cymbalta. Inform pt md was out of office. Will send her a msg and give her a call on Monday with her response. If she needed something then she would have to be seen by another md to be evaluated. Pt states she could wait until md return...lmb

## 2013-09-08 MED ORDER — DULOXETINE HCL 20 MG PO CPEP: 20.0000 mg | ORAL_CAPSULE | Freq: Every day | ORAL | Status: DC

## 2013-09-08 NOTE — Telephone Encounter (Signed)
Notified pt with md response.../lmb 

## 2013-09-08 NOTE — Telephone Encounter (Signed)
Called pt no answer LMOM RTC.../lmb 

## 2013-09-08 NOTE — Telephone Encounter (Signed)
Will change to lower dose of Cymbalta. Take 20 mg daily to minimize potential side effect symptoms -erx done Continue Xanax as needed as ongoing and followup as planned, call sooner if symptoms worse or unimproved

## 2013-09-15 ENCOUNTER — Ambulatory Visit: Payer: BC Managed Care – PPO | Admitting: Internal Medicine

## 2013-09-16 ENCOUNTER — Encounter: Payer: Self-pay | Admitting: Internal Medicine

## 2013-09-18 ENCOUNTER — Encounter: Payer: Self-pay | Admitting: Internal Medicine

## 2013-10-16 ENCOUNTER — Other Ambulatory Visit: Payer: Self-pay | Admitting: Cardiology

## 2013-10-20 ENCOUNTER — Encounter: Payer: Self-pay | Admitting: Cardiology

## 2013-10-20 ENCOUNTER — Ambulatory Visit (INDEPENDENT_AMBULATORY_CARE_PROVIDER_SITE_OTHER): Payer: BC Managed Care – PPO | Admitting: Cardiology

## 2013-10-20 VITALS — BP 122/72 | HR 60 | Ht 63.5 in | Wt 106.0 lb

## 2013-10-20 DIAGNOSIS — R002 Palpitations: Secondary | ICD-10-CM

## 2013-10-20 DIAGNOSIS — E785 Hyperlipidemia, unspecified: Secondary | ICD-10-CM

## 2013-10-20 DIAGNOSIS — Z9189 Other specified personal risk factors, not elsewhere classified: Secondary | ICD-10-CM

## 2013-10-20 DIAGNOSIS — E782 Mixed hyperlipidemia: Secondary | ICD-10-CM

## 2013-10-20 DIAGNOSIS — Z789 Other specified health status: Secondary | ICD-10-CM

## 2013-10-20 MED ORDER — ASPIRIN EC 81 MG PO TBEC: 81.0000 mg | DELAYED_RELEASE_TABLET | Freq: Every day | ORAL | Status: DC

## 2013-10-20 NOTE — Patient Instructions (Signed)
Take aspirin 81mg  daily.   Your physician recommends that you return for a FASTING lipid profile.  Your physician wants you to follow-up in: 1 year with Dr Shirlee Latch. (November 2015).  You will receive a reminder letter in the mail two months in advance. If you don't receive a letter, please call our office to schedule the follow-up appointment.

## 2013-10-20 NOTE — Progress Notes (Signed)
Patient ID: [name], female   DOB: June 22, 1956, 57 y.o.   MRN: [id number] PCP: Dr. Felicity Coyer  57 yo with history of hyperlipidemia, palpitations, and strong family history of CAD presents for cardiology followup.  She has been seen by Dr. Daleen Squibb in the past and is seen by me for the first time today.  She has had periodic palpitations since her teenage years.  These have been thought to be due to premature beats. They are worse when she is under stress.  She does not drink much caffeine.  The palpitations were very bad in 8/14-9/14.  She underwent very severe stress in her personal life.  She is now on Xanax and Cymbalta and is dealing with the stress better.  The palpitations seem to have subsided.  She never had long runs of rapid heart rates or lightheadedness/presyncope.  No chest pain, no exertional dyspnea.    ECG: NSR, normal  Labs (11/13): LDL 60, HDL 57  PMH: 1. Hyperlipidemia 2. Palpitations: suspect premature beats  FH: Father with MI in his late 34s and CVA.   SH: Divorced, prior smoker, lives in [place], currently unemployed.   ROS: All systems reviewed and negative except as per HPI.   Current Outpatient Prescriptions  Medication Sig Dispense Refill  . ALPRAZolam (XANAX) 0.25 MG tablet Take 1 tablet (0.25 mg total) by mouth 3 (three) times daily as needed for sleep or anxiety.  60 tablet  0  . Biotin (BIOTIN 5000) 5 MG CAPS Take 1 capsule by mouth daily.        . Calcium Citrate-Vitamin D (CITRACAL/VITAMIN D) 250-200 MG-UNIT TABS Take 2 tablets by mouth daily.        . Cyanocobalamin (B-12) 2500 MCG TABS Take by mouth daily.      . DULoxetine (CYMBALTA) 20 MG capsule Take 1 capsule (20 mg total) by mouth daily.  30 capsule  3  . estradiol (VIVELLE-DOT) 0.05 MG/24HR patch Place 1 patch onto the skin once a week.      . ferrous gluconate (FERGON) 246 (28 FE) MG tablet Take 246 mg by mouth daily with breakfast.        . fish oil-omega-3 fatty acids 1000 MG capsule Take 2 g  by mouth daily.      . Omega-3 Fatty Acids (FISH OIL PO) Take 2,000 mg by mouth 2 (two) times daily.      . progesterone (PROMETRIUM) 100 MG capsule Take 100 mg by mouth daily.        Marland Kitchen VYTORIN 10-20 MG per tablet TAKE ONE TABLET AT BEDTIME.  30 tablet  0  . aspirin EC 81 MG tablet Take 1 tablet (81 mg total) by mouth daily.       No current facility-administered medications for this visit.    BP 122/72  Pulse 60  Ht 5' 3.5" (1.613 m)  Wt 106 lb (48.081 kg)  BMI 18.48 kg/m2 General: NAD Neck: No JVD, no thyromegaly or thyroid nodule.  Lungs: Clear to auscultation bilaterally with normal respiratory effort. CV: Nondisplaced PMI.  Heart regular S1/S2, no S3/S4, no murmur.  No peripheral edema.  No carotid bruit.  Normal pedal pulses.  Abdomen: Soft, nontender, no hepatosplenomegaly, no distention.  Skin: Intact without lesions or rashes.  Neurologic: Alert and oriented x 3.  Psych: Normal affect. Extremities: No clubbing or cyanosis.   Assessment/Plan: 1. Palpitations: Suspected premature beats.  These are worse with stress and were quite bad a couple of months ago.  They  have now mostly subsided.  I do not think that there is utility at this point to having her wear a holter monitor as her symptoms are better.  I will check a TSH.  2. Hyperlipidemia: Continue Vytorin, check lipids today.  3. CAD risk: Given her father's MI in his late 25s, I think it would be reasonable for her to use ASA 81 mg daily.   Marca Ancona 10/21/2013

## 2013-10-21 DIAGNOSIS — Z9189 Other specified personal risk factors, not elsewhere classified: Secondary | ICD-10-CM | POA: Insufficient documentation

## 2013-10-21 DIAGNOSIS — R002 Palpitations: Secondary | ICD-10-CM | POA: Insufficient documentation

## 2013-10-27 ENCOUNTER — Other Ambulatory Visit (INDEPENDENT_AMBULATORY_CARE_PROVIDER_SITE_OTHER): Payer: BC Managed Care – PPO

## 2013-10-27 ENCOUNTER — Telehealth: Payer: Self-pay | Admitting: Cardiology

## 2013-10-27 ENCOUNTER — Other Ambulatory Visit: Payer: BC Managed Care – PPO

## 2013-10-27 DIAGNOSIS — E782 Mixed hyperlipidemia: Secondary | ICD-10-CM

## 2013-10-27 LAB — LIPID PANEL
Cholesterol: 126 mg/dL (ref 0–200)
HDL: 60.7 mg/dL (ref >39.00)
LDL Cholesterol: 55 mg/dL (ref 0–99)
Triglycerides: 53 mg/dL (ref 0.0–149.0)
VLDL: 10.6 mg/dL (ref 0.0–40.0)

## 2013-10-27 NOTE — Telephone Encounter (Signed)
New Problem  Pt states she just completed a drug test and the office has questions about the drugs she is taking// please call   [facility] [phone]  Ask for Lelon Mast or Sherri

## 2013-10-28 ENCOUNTER — Other Ambulatory Visit: Payer: BC Managed Care – PPO

## 2013-10-28 NOTE — Telephone Encounter (Signed)
I spoke with Pam H in [facility]. She is going to follow up on this call.

## 2013-11-13 ENCOUNTER — Other Ambulatory Visit: Payer: Self-pay | Admitting: Cardiology

## 2014-06-02 ENCOUNTER — Other Ambulatory Visit: Payer: Self-pay | Admitting: Internal Medicine

## 2014-10-06 ENCOUNTER — Other Ambulatory Visit: Payer: Self-pay | Admitting: Internal Medicine

## 2014-10-07 ENCOUNTER — Encounter: Payer: Self-pay | Admitting: Cardiology

## 2014-10-07 ENCOUNTER — Ambulatory Visit (INDEPENDENT_AMBULATORY_CARE_PROVIDER_SITE_OTHER): Payer: BC Managed Care – PPO | Admitting: Cardiology

## 2014-10-07 VITALS — BP 137/79 | HR 57 | Ht 65.0 in | Wt 121.0 lb

## 2014-10-07 DIAGNOSIS — E785 Hyperlipidemia, unspecified: Secondary | ICD-10-CM

## 2014-10-07 DIAGNOSIS — R002 Palpitations: Secondary | ICD-10-CM

## 2014-10-07 MED ORDER — ASPIRIN EC 81 MG PO TBEC: DELAYED_RELEASE_TABLET | ORAL | Status: DC

## 2014-10-07 NOTE — Patient Instructions (Signed)
Take aspirin 81mg  every other day.  Your physician recommends that you return for a FASTING lipid profile. This is scheduled for Tuesday November 10,2015 at the [facility].   Your physician wants you to follow-up in: 1 year with Dr Aundra Dubin. (November 2016).  You will receive a reminder letter in the mail two months in advance. If you don't receive a letter, please call our office to schedule the follow-up appointment.

## 2014-10-08 NOTE — Progress Notes (Signed)
Patient ID: [name], female   DOB: [birth year], 58 y.o.   MRN: [id number] PCP: Dr. Asa Lente  58 yo with history of hyperlipidemia, palpitations, and strong family history of CAD presents for cardiology followup. She has had periodic palpitations since her teenage years.  These have been thought to be due to premature beats. They are worse when she is under stress.  At last visit, she was undergoing a separation from her husband and was having a lot of palpitations.  More recently, she has noted only rare palpitations.  No chest pain, no exertional dyspnea.    ECG: NSR, normal  Labs (11/13): LDL 60, HDL 57 Labs (11/14): LDL 55, HDL 61  PMH: 1. Hyperlipidemia 2. Palpitations: suspect PVCs  FH: Father with MI in his late 48s and CVA.   SH: Divorced, prior smoker, lives in [place], currently unemployed.   ROS: All systems reviewed and negative except as per HPI.   Current Outpatient Prescriptions  Medication Sig Dispense Refill  . ALPRAZolam (XANAX) 0.25 MG tablet Take 1 tablet (0.25 mg total) by mouth 3 (three) times daily as needed for sleep or anxiety. 60 tablet 0  . Biotin (BIOTIN 5000) 5 MG CAPS Take 1 capsule by mouth daily.      . Calcium Citrate-Vitamin D (CITRACAL/VITAMIN D) 250-200 MG-UNIT TABS Take 2 tablets by mouth daily.      . Cyanocobalamin (B-12) 2500 MCG TABS Take by mouth daily.    Marland Kitchen estradiol (VIVELLE-DOT) 0.05 MG/24HR patch Place 1 patch onto the skin once a week.    . ferrous gluconate (FERGON) 246 (28 FE) MG tablet Take 246 mg by mouth daily with breakfast.      . fish oil-omega-3 fatty acids 1000 MG capsule Take 2 g by mouth daily.    . fluticasone (FLONASE) 50 MCG/ACT nasal spray As needed for allergies  0  . medroxyPROGESTERone (PROVERA) 5 MG tablet Take 1 tablet daily (PROGESTONE)  98  . Omega-3 Fatty Acids (FISH OIL PO) Take 2,000 mg by mouth 2 (two) times daily.    . progesterone (PROMETRIUM) 100 MG capsule Take 100 mg by mouth daily.      Marland Kitchen VYTORIN  10-20 MG per tablet TAKE ONE TABLET AT BEDTIME. 30 tablet 11  . aspirin EC 81 MG tablet 1 tablet every other day    . DULoxetine (CYMBALTA) 20 MG capsule 1 tablet daily (CYMBALTA)  0   No current facility-administered medications for this visit.    BP 137/79 mmHg  Pulse 57  Ht 5\' 5"  (1.651 m)  Wt 121 lb (54.885 kg)  BMI 20.14 kg/m2 General: NAD Neck: No JVD, no thyromegaly or thyroid nodule.  Lungs: Clear to auscultation bilaterally with normal respiratory effort. CV: Nondisplaced PMI.  Heart regular S1/S2, no S3/S4, no murmur.  No peripheral edema.  No carotid bruit.  Normal pedal pulses.  Abdomen: Soft, nontender, no hepatosplenomegaly, no distention.  Skin: Intact without lesions or rashes.  Neurologic: Alert and oriented x 3.  Psych: Normal affect. Extremities: No clubbing or cyanosis.   Assessment/Plan: 1. Palpitations: Suspected premature beats.  These have subsided now that she is under less stress.   2. Hyperlipidemia: Continue Vytorin, check lipids.  3. CAD risk: Given her father's MI in his late 61s, I think it would be reasonable for her to take ASA 81.  When she was taking this daily she thinks she bled too easily so she stopped it.  I asked her to restart it every other day.  Loralie Champagne 10/08/2014

## 2014-10-12 ENCOUNTER — Ambulatory Visit (INDEPENDENT_AMBULATORY_CARE_PROVIDER_SITE_OTHER): Payer: BC Managed Care – PPO | Admitting: Internal Medicine

## 2014-10-12 ENCOUNTER — Encounter: Payer: Self-pay | Admitting: Internal Medicine

## 2014-10-12 VITALS — BP 116/72 | HR 72 | Temp 99.0°F | Resp 16 | Ht 64.0 in | Wt 122.2 lb

## 2014-10-12 DIAGNOSIS — M6530 Trigger finger, unspecified finger: Secondary | ICD-10-CM

## 2014-10-12 NOTE — Progress Notes (Signed)
Pre visit review using our clinic review tool, if applicable. No additional management support is needed unless otherwise documented below in the visit note. 

## 2014-10-12 NOTE — Patient Instructions (Signed)
We will get you into the hand center for your hand. In the meanwhile you can continue taking ibuprofen, please try to take it with food as this will help to decrease the impact on your stomach. If you decide that this is not working for pain please call our office back and we can try something else.  Trigger Finger Trigger finger (digital tendinitis and stenosing tenosynovitis) is a common disorder that causes an often painful catching of the fingers or thumb. It occurs as a clicking, snapping, or locking of a finger in the palm of the hand. This is caused by a problem with the tendons that flex or bend the fingers sliding smoothly through their sheaths. The condition may occur in any finger or a couple fingers at the same time.  The finger may lock with the finger curled or suddenly straighten out with a snap. This is more common in patients with rheumatoid arthritis and diabetes. Left untreated, the condition may get worse to the point where the finger becomes locked in flexion, like making a fist, or less commonly locked with the finger straightened out. CAUSES   Inflammation and scarring that lead to swelling around the tendon sheath.  Repeated or forceful movements.  Rheumatoid arthritis, an autoimmune disease that affects joints.  Gout.  Diabetes mellitus. SIGNS AND SYMPTOMS  Soreness and swelling of your finger.  A painful clicking or snapping as you bend and straighten your finger. DIAGNOSIS  Your health care provider will do a physical exam of your finger to diagnose trigger finger. TREATMENT   Splinting for 6-8 weeks may be helpful.  Nonsteroidal anti-inflammatory medicines (NSAIDs) can help to relieve the pain and inflammation.  Cortisone injections, along with splinting, may speed up recovery. Several injections may be required. Cortisone may give relief after one injection.  Surgery is another treatment that may be used if conservative treatments do not work. Surgery can  be minor, without incisions (a cut does not have to be made), and can be done with a needle through the skin.  Other surgical choices involve an open procedure in which the surgeon opens the hand through a small incision and cuts the pulley so the tendon can again slide smoothly. Your hand will still work fine. HOME CARE INSTRUCTIONS  Apply ice to the injured area, twice per day:  Put ice in a plastic bag.  Place a towel between your skin and the bag.  Leave the ice on for 20 minutes, 3-4 times a day.  Rest your hand often. MAKE SURE YOU:   Understand these instructions.  Will watch your condition.  Will get help right away if you are not doing well or get worse. Document Released: 09/09/2004 Document Revised: 07/23/2013 Document Reviewed: 04/22/2013 [facility] Patient Information 2015 [place], [place]. This information is not intended to replace advice given to you by your health care provider. Make sure you discuss any questions you have with your health care provider.

## 2014-10-12 NOTE — Progress Notes (Signed)
   Subjective:    Patient ID: [name], female    DOB: [birth year], 58 y.o.   MRN: [id number]  HPI The patient is a 58 year old female comes in today for an acute visit for hand problem. She has been having some locking of her right thumb as well she noticed 2 cysts on the palm of her right hand. They're close to the second and third fingers however she's not had any trouble with those fingers. She is having some pain in the right thumb. No recent injury to the thumb.  Review of Systems  Constitutional: Negative.   Respiratory: Negative for cough, chest tightness and shortness of breath.   Cardiovascular: Negative for chest pain, palpitations and leg swelling.  Musculoskeletal: Positive for arthralgias.  Skin:       2 cysts on right palm  Neurological: Negative.       Objective:   Physical Exam  Constitutional: She appears well-developed and well-nourished.  HENT:  Head: Normocephalic and atraumatic.  Eyes: EOM are normal.  Neck: Normal range of motion.  Cardiovascular: Normal rate and regular rhythm.   Pulmonary/Chest: Effort normal and breath sounds normal.  Abdominal: Soft. Bowel sounds are normal.  Musculoskeletal:  Right thumb with some catching no pain at MCP joint some pain in the thenar region.  Skin: Skin is warm and dry.  2 nodules on the palmar surface of the right hand by the second and third digits. No catching w/flexor motion   Filed Vitals:   10/12/14 1349  BP: 116/72  Pulse: 72  Temp: 99 F (37.2 C)  TempSrc: Oral  Resp: 16  Height: 5\' 4"  (1.626 m)  Weight: 122 lb 3.2 oz (55.43 kg)  SpO2: 97%      Assessment & Plan:

## 2014-10-13 ENCOUNTER — Other Ambulatory Visit: Payer: BC Managed Care – PPO

## 2014-10-13 DIAGNOSIS — M6530 Trigger finger, unspecified finger: Secondary | ICD-10-CM | POA: Insufficient documentation

## 2014-10-13 NOTE — Assessment & Plan Note (Signed)
Right thumb. She started taking ibuprofen successfully for pain. Advised her to ice several times a day until she sees a hand doctor. She wishes to go to the hand Center so made referral.

## 2014-10-16 ENCOUNTER — Encounter: Payer: Self-pay | Admitting: Internal Medicine

## 2014-10-16 ENCOUNTER — Encounter: Payer: Self-pay | Admitting: Cardiology

## 2014-10-22 ENCOUNTER — Other Ambulatory Visit (INDEPENDENT_AMBULATORY_CARE_PROVIDER_SITE_OTHER): Payer: BC Managed Care – PPO

## 2014-10-22 ENCOUNTER — Other Ambulatory Visit: Payer: BC Managed Care – PPO

## 2014-10-22 DIAGNOSIS — E785 Hyperlipidemia, unspecified: Secondary | ICD-10-CM

## 2014-10-23 ENCOUNTER — Telehealth: Payer: Self-pay | Admitting: Cardiology

## 2014-10-23 DIAGNOSIS — E785 Hyperlipidemia, unspecified: Secondary | ICD-10-CM

## 2014-10-23 LAB — LIPID PANEL
CHOL/HDL RATIO: 3
Cholesterol: 118 mg/dL (ref 0–200)
HDL: 47.1 mg/dL (ref >39.00)
LDL Cholesterol: 62 mg/dL (ref 0–99)
NonHDL: 70.9
TRIGLYCERIDES: 44 mg/dL (ref 0.0–149.0)
VLDL: 8.8 mg/dL (ref 0.0–40.0)

## 2014-10-23 NOTE — Telephone Encounter (Signed)
Future order in Epic for lipid profile, Blanch Media aware.

## 2014-10-23 NOTE — Telephone Encounter (Signed)
New Msg   Blanch Media from [facility] is calling about patients blood work, states there is no future on it and will not be able to hold blood work long. Please contact at [phone]

## 2014-11-08 ENCOUNTER — Other Ambulatory Visit: Payer: Self-pay | Admitting: Internal Medicine

## 2014-11-09 ENCOUNTER — Telehealth: Payer: Self-pay | Admitting: Internal Medicine

## 2014-11-09 NOTE — Telephone Encounter (Signed)
Pt send this massage through my chart, please advise.   Dr Asa Lente has prescribed Duloxetine HCL DR 20 MG C for me...going thru horrible divorce...since I can't get an appt. with her because she is booked thru Dec...will she refill until I can get there in January?? Thanks.Marland KitchenMarland KitchenMarland KitchenParker....       I get it filled at [facility]....[phone]

## 2014-11-09 NOTE — Telephone Encounter (Signed)
erx done

## 2014-11-23 ENCOUNTER — Encounter: Payer: Self-pay | Admitting: Cardiology

## 2014-11-24 MED ORDER — EZETIMIBE-SIMVASTATIN 10-20 MG PO TABS: ORAL_TABLET | ORAL | Status: DC

## 2014-11-30 ENCOUNTER — Other Ambulatory Visit: Payer: Self-pay | Admitting: Obstetrics and Gynecology

## 2014-11-30 ENCOUNTER — Telehealth: Payer: Self-pay | Admitting: *Deleted

## 2014-11-30 NOTE — Telephone Encounter (Signed)
PA for Vytorin sent via CoverMyMeds

## 2014-12-01 LAB — CYTOLOGY - PAP

## 2014-12-08 ENCOUNTER — Encounter: Payer: Self-pay | Admitting: Internal Medicine

## 2014-12-08 DIAGNOSIS — M65319 Trigger thumb, unspecified thumb: Secondary | ICD-10-CM

## 2014-12-08 NOTE — Telephone Encounter (Signed)
PA for Vytorin approved through 12/01/2015.

## 2015-03-30 ENCOUNTER — Telehealth: Payer: Self-pay

## 2015-03-30 NOTE — Telephone Encounter (Signed)
Prior auth approved for Vytorin 10/20 1 po daily from [place]. Pharmacy advised of this.

## 2015-06-08 ENCOUNTER — Encounter: Payer: Self-pay | Admitting: Cardiology

## 2015-06-08 NOTE — Telephone Encounter (Signed)
-----   Message from Larey Dresser, MD sent at 06/08/2015  1:07 PM EDT ----- Please have this patient stop Vytorin and start atorvastatin 40 mg daily with lipids/LFTs in 2 months.

## 2015-06-08 NOTE — Telephone Encounter (Signed)
Patient called no answer.Left message on personal voice mail to call and speak to a triage nurse.Dr.McLean wants you to stop vytorin and start atorvastatin 40 mg daily.Repeat fasting lipid and liver panels in 2 months.

## 2015-06-09 ENCOUNTER — Telehealth: Payer: Self-pay

## 2015-06-09 MED ORDER — ATORVASTATIN CALCIUM 40 MG PO TABS: 40.0000 mg | ORAL_TABLET | Freq: Every day | ORAL | Status: DC

## 2015-06-09 NOTE — Telephone Encounter (Signed)
Called patient.  Told her to stop vytorin and start atorvastain 40 mg daily. Changing orders and adding atorvastatin 40 mg daily Sent into [facility] Patient called at [phone] and let her know that atorvastatin has been e scribed to [facility]

## 2015-06-30 ENCOUNTER — Encounter: Payer: Self-pay | Admitting: Internal Medicine

## 2015-06-30 MED ORDER — DULOXETINE HCL 20 MG PO CPEP: 20.0000 mg | ORAL_CAPSULE | Freq: Every day | ORAL | Status: DC

## 2015-06-30 NOTE — Telephone Encounter (Signed)
rx sent but waiting on advisement

## 2015-07-12 ENCOUNTER — Encounter: Payer: Self-pay | Admitting: Cardiology

## 2015-07-20 ENCOUNTER — Telehealth: Payer: Self-pay | Admitting: *Deleted

## 2015-07-20 NOTE — Telephone Encounter (Signed)
-----   Message from Emmaline Life, RN sent at 07/12/2015  1:54 PM EDT ----- Evern Core,  I emailed with this patient about her Atorvastatin but couldn't forward the email to you.  She was c/o possible reaction and wanted to hold it for awhile.  I told her you guys would follow-up when you return to the office.  Thanks, Sharyn Lull

## 2015-07-20 NOTE — Telephone Encounter (Signed)
LMTCB for pt 

## 2015-07-22 ENCOUNTER — Encounter: Payer: Self-pay | Admitting: Cardiology

## 2015-07-22 MED ORDER — ROSUVASTATIN CALCIUM 10 MG PO TABS: 10.0000 mg | ORAL_TABLET | Freq: Every day | ORAL | Status: DC

## 2015-07-22 NOTE — Telephone Encounter (Addendum)
Called patient about her new prescription for rosuvastatin generic 10 mg by mouth daily. Patient verbalized understanding and sent to patient's pharmacy.   ----- Message from Larey Dresser, MD sent at 07/22/2015  1:42 PM EDT ----- Please have her start rosuvastatin generic 10 mg daily.  Cannot tolerate atorvastatin.

## 2015-07-23 NOTE — Telephone Encounter (Signed)
See MyChart message 07/22/15, pt stopping atorvastatin, starting rosuvastatin 10mg  daily.

## 2015-07-26 ENCOUNTER — Ambulatory Visit (INDEPENDENT_AMBULATORY_CARE_PROVIDER_SITE_OTHER): Payer: BLUE CROSS/BLUE SHIELD | Admitting: Internal Medicine

## 2015-07-26 ENCOUNTER — Other Ambulatory Visit (INDEPENDENT_AMBULATORY_CARE_PROVIDER_SITE_OTHER): Payer: BLUE CROSS/BLUE SHIELD

## 2015-07-26 ENCOUNTER — Encounter: Payer: Self-pay | Admitting: Internal Medicine

## 2015-07-26 VITALS — BP 112/78 | HR 72 | Temp 98.7°F | Ht 64.0 in | Wt 129.0 lb

## 2015-07-26 DIAGNOSIS — Z0000 Encounter for general adult medical examination without abnormal findings: Secondary | ICD-10-CM | POA: Diagnosis not present

## 2015-07-26 DIAGNOSIS — E785 Hyperlipidemia, unspecified: Secondary | ICD-10-CM

## 2015-07-26 DIAGNOSIS — Z1159 Encounter for screening for other viral diseases: Secondary | ICD-10-CM

## 2015-07-26 DIAGNOSIS — F419 Anxiety disorder, unspecified: Secondary | ICD-10-CM

## 2015-07-26 DIAGNOSIS — Z114 Encounter for screening for human immunodeficiency virus [HIV]: Secondary | ICD-10-CM

## 2015-07-26 DIAGNOSIS — Z1211 Encounter for screening for malignant neoplasm of colon: Secondary | ICD-10-CM

## 2015-07-26 LAB — URINALYSIS, ROUTINE W REFLEX MICROSCOPIC
BILIRUBIN URINE: NEGATIVE
HGB URINE DIPSTICK: NEGATIVE
Ketones, ur: NEGATIVE
Leukocytes, UA: NEGATIVE
Nitrite: NEGATIVE
RBC / HPF: NONE SEEN (ref 0–?)
Specific Gravity, Urine: >=1.03 — AB (ref 1.000–1.030)
Urine Glucose: NEGATIVE
Urobilinogen, UA: 0.2 (ref 0.0–1.0)
pH: 5.5 (ref 5.0–8.0)

## 2015-07-26 LAB — LIPID PANEL
CHOLESTEROL: 188 mg/dL (ref 0–200)
HDL: 47.2 mg/dL (ref >39.00)
LDL Cholesterol: 124 mg/dL — ABNORMAL HIGH (ref 0–99)
NONHDL: 140.72
Total CHOL/HDL Ratio: 4
Triglycerides: 86 mg/dL (ref 0.0–149.0)
VLDL: 17.2 mg/dL (ref 0.0–40.0)

## 2015-07-26 LAB — CBC WITH DIFFERENTIAL/PLATELET
BASOS ABS: 0.1 10*3/uL (ref 0.0–0.1)
Basophils Relative: 0.8 % (ref 0.0–3.0)
EOS PCT: 1.3 % (ref 0.0–5.0)
Eosinophils Absolute: 0.1 10*3/uL (ref 0.0–0.7)
HEMATOCRIT: 42.6 % (ref 36.0–46.0)
HEMOGLOBIN: 14.4 g/dL (ref 12.0–15.0)
LYMPHS ABS: 1.6 10*3/uL (ref 0.7–4.0)
LYMPHS PCT: 24.6 % (ref 12.0–46.0)
MCHC: 33.7 g/dL (ref 30.0–36.0)
MCV: 90.5 fl (ref 78.0–100.0)
MONOS PCT: 6.8 % (ref 3.0–12.0)
Monocytes Absolute: 0.4 10*3/uL (ref 0.1–1.0)
NEUTROS PCT: 66.5 % (ref 43.0–77.0)
Neutro Abs: 4.4 10*3/uL (ref 1.4–7.7)
Platelets: 279 10*3/uL (ref 150.0–400.0)
RBC: 4.71 Mil/uL (ref 3.87–5.11)
RDW: 12.9 % (ref 11.5–15.5)
WBC: 6.6 10*3/uL (ref 4.0–10.5)

## 2015-07-26 LAB — TSH: TSH: 2.46 u[IU]/mL (ref 0.35–4.50)

## 2015-07-26 LAB — BASIC METABOLIC PANEL
BUN: 15 mg/dL (ref 6–23)
CALCIUM: 9.7 mg/dL (ref 8.4–10.5)
CO2: 27 mEq/L (ref 19–32)
Chloride: 104 mEq/L (ref 96–112)
Creatinine, Ser: 0.88 mg/dL (ref 0.40–1.20)
GFR: 69.81 mL/min (ref >60.00)
GLUCOSE: 97 mg/dL (ref 70–99)
POTASSIUM: 3.9 meq/L (ref 3.5–5.1)
SODIUM: 139 meq/L (ref 135–145)

## 2015-07-26 LAB — HEPATIC FUNCTION PANEL
ALBUMIN: 4.7 g/dL (ref 3.5–5.2)
ALK PHOS: 85 U/L (ref 39–117)
ALT: 30 U/L (ref 0–35)
AST: 26 U/L (ref 0–37)
Bilirubin, Direct: 0.3 mg/dL (ref 0.0–0.3)
TOTAL PROTEIN: 7.5 g/dL (ref 6.0–8.3)
Total Bilirubin: 2.1 mg/dL — ABNORMAL HIGH (ref 0.2–1.2)

## 2015-07-26 LAB — HEPATITIS C ANTIBODY: HCV Ab: NEGATIVE

## 2015-07-26 MED ORDER — DULOXETINE HCL 20 MG PO CPEP: 20.0000 mg | ORAL_CAPSULE | ORAL | Status: DC

## 2015-07-26 NOTE — Assessment & Plan Note (Signed)
Severe situational symptoms, specifically by social stressors with spouse. Manifest with severe panic attacks, anorexia and subsequent weight loss. Patient reports she is currently improve and ready to wean off same - Advise 20 mg tablet every other day for one month, then every third day if still needed. Patient will monitor for emotional flares of symptoms and call if increasing irritability or anxiety to resume same prn Patient understands and agrees

## 2015-07-26 NOTE — Progress Notes (Signed)
Subjective:    Patient ID: [name], female    DOB: Nov 26, 1956, 59 y.o.   MRN: [id number]  HPI  Patient here for follow up and annual physical - last seen by me 10/2013 for anxiety - Now ready to wean off med begun for same at that time as situational stressors have improved  Other chronic medical conditions, interval events and current concerns are reviewed  Past Medical History  Diagnosis Date  . Hyperlipidemia   . Acid reflux    Family History  Problem Relation Age of Onset  . Stroke Father   . Hyperlipidemia    . Breast cancer Maternal Aunt   . Heart disease Father   . Emphysema Mother    Social History  Substance Use Topics  . Smoking status: Former Smoker -- 0.30 packs/day for 4 years    Quit date: 12/04/1992  . Smokeless tobacco: None  . Alcohol Use: 0.0 oz/week     Comment: socially    Review of Systems  Constitutional: Negative for fatigue and unexpected weight change.  Respiratory: Negative for cough, shortness of breath and wheezing.   Cardiovascular: Positive for leg swelling (dep edema x 2 mo B ankles). Negative for chest pain and palpitations.  Gastrointestinal: Negative for nausea, abdominal pain and diarrhea.  Neurological: Negative for dizziness, weakness, light-headedness and headaches.  Psychiatric/Behavioral: Negative for dysphoric mood. The patient is not nervous/anxious.   All other systems reviewed and are negative.      Objective:    Physical Exam  Constitutional: She is oriented to person, place, and time. She appears well-developed and well-nourished. No distress.  HENT:  Head: Normocephalic and atraumatic.  Right Ear: External ear normal.  Left Ear: External ear normal.  Nose: Nose normal.  Mouth/Throat: Oropharynx is clear and moist. No oropharyngeal exudate.  Eyes: EOM are normal. Pupils are equal, round, and reactive to light. Right eye exhibits no discharge. Left eye exhibits no discharge. No scleral icterus.  Neck: Normal  range of motion. Neck supple. No JVD present. No tracheal deviation present. No thyromegaly present.  Cardiovascular: Normal rate, regular rhythm, normal heart sounds and intact distal pulses.  Exam reveals no friction rub.   No murmur heard. Pulmonary/Chest: Effort normal and breath sounds normal. No respiratory distress. She has no wheezes. She has no rales. She exhibits no tenderness.  Abdominal: Soft. Bowel sounds are normal. She exhibits no distension and no mass. There is no tenderness. There is no rebound and no guarding.  Genitourinary:  Defer to gyn  Musculoskeletal: Normal range of motion. She exhibits no edema.  No gross deformities  Lymphadenopathy:    She has no cervical adenopathy.  Neurological: She is alert and oriented to person, place, and time. She has normal reflexes. No cranial nerve deficit.  Skin: Skin is warm and dry. No rash noted. She is not diaphoretic. No erythema.  Psychiatric: She has a normal mood and affect. Her behavior is normal. Judgment and thought content normal.  Nursing note and vitals reviewed.   BP 112/78 mmHg  Pulse 72  Temp(Src) 98.7 F (37.1 C) (Oral)  Ht 5\' 4"  (1.626 m)  Wt 129 lb (58.514 kg)  BMI 22.13 kg/m2  SpO2 97% Wt Readings from Last 3 Encounters:  07/26/15 129 lb (58.514 kg)  10/12/14 122 lb 3.2 oz (55.43 kg)  10/07/14 121 lb (54.885 kg)    Lab Results  Component Value Date   GLUCOSE 94 10/22/2012   CHOL 118 10/23/2014   TRIG  44.0 10/23/2014   HDL 47.10 10/23/2014   LDLCALC 62 10/23/2014   ALT 25 10/22/2012   AST 30 10/22/2012   NA 139 10/22/2012   K 4.2 10/22/2012   CL 103 10/22/2012   CREATININE 0.9 10/22/2012   BUN 19 10/22/2012   CO2 29 10/22/2012    No results found.     Assessment & Plan:   CPX/z00.00 - Patient has been counseled on age-appropriate routine health concerns for screening and prevention. These are reviewed and up-to-date. Immunizations are up-to-date or declined. Labs ordered and  reviewed.  Problem List Items Addressed This Visit    Anxiety    Severe situational symptoms, specifically by social stressors with spouse. Manifest with severe panic attacks, anorexia and subsequent weight loss. Patient reports she is currently improve and ready to wean off same - Advise 20 mg tablet every other day for one month, then every third day if still needed. Patient will monitor for emotional flares of symptoms and call if increasing irritability or anxiety to resume same prn Patient understands and agrees      Relevant Medications   DULoxetine (CYMBALTA) 20 MG capsule   Hyperlipidemia    Reports recent change from atorva to [facility] August 2016 Recheck labs now       Other Visit Diagnoses    Routine general medical examination at a health care facility    -  Primary    Relevant Orders    Basic metabolic panel    CBC with Differential/Platelet    Hepatic function panel    Lipid panel    TSH    Urinalysis, Routine w reflex microscopic (not at [facility])    Need for hepatitis C screening test        Relevant Orders    Hepatitis C antibody    Screening for HIV without presence of risk factors        Relevant Orders    HIV antibody    Screen for colon cancer        Relevant Orders    Ambulatory referral to Gastroenterology        Gwendolyn Grant, MD

## 2015-07-26 NOTE — Assessment & Plan Note (Signed)
Reports recent change from atorva to [facility] August 2016 [place] labs now

## 2015-07-26 NOTE — Progress Notes (Signed)
Pre visit review using our clinic review tool, if applicable. No additional management support is needed unless otherwise documented below in the visit note. 

## 2015-07-26 NOTE — Patient Instructions (Signed)
It was good to see you today.  We have reviewed your prior records including labs and tests today  Health Maintenance reviewed - after labs today, colon screening complete and flu shot in the fall, all your recommended immunizations and age-appropriate screenings are up-to-date.  Test(s) ordered today. Your results will be released to [facility] (or called to you) after review, usually within 72hours after test completion. If any changes need to be made, you will be notified at that same time.  Medications reviewed and updated Take Cymbalta every other day x 1 month to discontinue. If needed for symptoms, may take one every third day to continue taper, but I do not expect this will be needed No other changes recommended at this time.  we'll make referral to gastroenterology for colonoscopy screening. Our office will contact you regarding appointment(s) once made.  Please schedule followup in 12 months for annual exam and labs, call sooner if problems.

## 2015-07-27 LAB — HIV ANTIBODY (ROUTINE TESTING W REFLEX): HIV: NONREACTIVE

## 2015-08-30 ENCOUNTER — Encounter: Payer: Self-pay | Admitting: Internal Medicine

## 2015-08-31 MED ORDER — ALPRAZOLAM 0.25 MG PO TABS: 0.2500 mg | ORAL_TABLET | Freq: Three times a day (TID) | ORAL | Status: DC | PRN

## 2015-08-31 MED ORDER — DULOXETINE HCL 20 MG PO CPEP: 20.0000 mg | ORAL_CAPSULE | Freq: Every day | ORAL | Status: DC

## 2015-08-31 NOTE — Telephone Encounter (Signed)
Please be sure patient is aware of refills at [facility] for each of these medications. Thanks

## 2015-10-13 ENCOUNTER — Encounter: Payer: Self-pay | Admitting: Gastroenterology

## 2015-10-13 ENCOUNTER — Ambulatory Visit (AMBULATORY_SURGERY_CENTER): Payer: Self-pay

## 2015-10-13 VITALS — Ht 65.0 in | Wt 131.0 lb

## 2015-10-13 DIAGNOSIS — Z1211 Encounter for screening for malignant neoplasm of colon: Secondary | ICD-10-CM

## 2015-10-13 MED ORDER — NA SULFATE-K SULFATE-MG SULF 17.5-3.13-1.6 GM/177ML PO SOLN: 1.0000 | Freq: Once | ORAL | Status: DC

## 2015-10-13 NOTE — Progress Notes (Signed)
No egg or soy allergies Not on home 02 No previous anesthesia complications Emmi video emailed to [email or web address]. No diet or weight loss meds

## 2015-10-18 ENCOUNTER — Ambulatory Visit (AMBULATORY_SURGERY_CENTER): Payer: BLUE CROSS/BLUE SHIELD | Admitting: Gastroenterology

## 2015-10-18 ENCOUNTER — Encounter: Payer: Self-pay | Admitting: Gastroenterology

## 2015-10-18 VITALS — BP 104/66 | HR 64 | Temp 97.2°F | Resp 22 | Ht 65.0 in | Wt 131.0 lb

## 2015-10-18 DIAGNOSIS — Z1211 Encounter for screening for malignant neoplasm of colon: Secondary | ICD-10-CM

## 2015-10-18 MED ORDER — SODIUM CHLORIDE 0.9 % IV SOLN: 500.0000 mL | INTRAVENOUS | Status: DC

## 2015-10-18 NOTE — Patient Instructions (Signed)
YOU HAD AN ENDOSCOPIC PROCEDURE TODAY AT [facility]:   Refer to the procedure report that was given to you for any specific questions about what was found during the examination.  If the procedure report does not answer your questions, please call your gastroenterologist to clarify.  If you requested that your care partner not be given the details of your procedure findings, then the procedure report has been included in a sealed envelope for you to review at your convenience later.  YOU SHOULD EXPECT: Some feelings of bloating in the abdomen. Passage of more gas than usual.  Walking can help get rid of the air that was put into your GI tract during the procedure and reduce the bloating. If you had a lower endoscopy (such as a colonoscopy or flexible sigmoidoscopy) you may notice spotting of blood in your stool or on the toilet paper. If you underwent a bowel prep for your procedure, you may not have a normal bowel movement for a few days.  Please Note:  You might notice some irritation and congestion in your nose or some drainage.  This is from the oxygen used during your procedure.  There is no need for concern and it should clear up in a day or so.  SYMPTOMS TO REPORT IMMEDIATELY:   Following lower endoscopy (colonoscopy or flexible sigmoidoscopy):  Excessive amounts of blood in the stool  Significant tenderness or worsening of abdominal pains  Swelling of the abdomen that is new, acute  Fever of 100F or higher   For urgent or emergent issues, a gastroenterologist can be reached at any hour by calling [phone].   DIET: Your first meal following the procedure should be a small meal and then it is ok to progress to your normal diet. Heavy or fried foods are harder to digest and may make you feel nauseous or bloated.  Likewise, meals heavy in dairy and vegetables can increase bloating.  Drink plenty of fluids but you should avoid alcoholic beverages for 24  hours.  ACTIVITY:  You should plan to take it easy for the rest of today and you should NOT DRIVE or use heavy machinery until tomorrow (because of the sedation medicines used during the test).    FOLLOW UP: Our staff will call the number listed on your records the next business day following your procedure to check on you and address any questions or concerns that you may have regarding the information given to you following your procedure. If we do not reach you, we will leave a message.  However, if you are feeling well and you are not experiencing any problems, there is no need to return our call.  We will assume that you have returned to your regular daily activities without incident.  If any biopsies were taken you will be contacted by phone or by letter within the next 1-3 weeks.  Please call us at [phone] if you have not heard about the biopsies in 3 weeks.    SIGNATURES/CONFIDENTIALITY: You and/or your care partner have signed paperwork which will be entered into your electronic medical record.  These signatures attest to the fact that that the information above on your After Visit Summary has been reviewed and is understood.  Full responsibility of the confidentiality of this discharge information lies with you and/or your care-partner.  We will see you back in 10 years!

## 2015-10-18 NOTE — Progress Notes (Signed)
Report to PACU, RN, vss, BBS= Clear.  

## 2015-10-18 NOTE — Op Note (Signed)
[facility]  [address] [place], [id number]   COLONOSCOPY PROCEDURE REPORT PATIENT: [name], [name]  MR#: [id number] BIRTHDATE: 1956/06/11 , 42  yrs. old GENDER: female ENDOSCOPIST: Ladene Artist, MD, [facility] REFERRED [id number] Asa Lente, M.D. PROCEDURE DATE:  10/18/2015 PROCEDURE:   Colonoscopy, screening First Screening Colonoscopy - Avg.  risk and is 50 yrs.  old or older Yes.  Prior Negative Screening - Now for repeat screening. N/A  History of Adenoma - Now for follow-up colonoscopy & has been > or = to 3 yrs.  N/A  Polyps removed today? No Recommend repeat exam, <10 yrs? No ASA CLASS:   Class II INDICATIONS:Screening for colonic neoplasia and Colorectal Neoplasm Risk Assessment for this procedure is average risk. MEDICATIONS: Monitored anesthesia care and Propofol 200 mg IV DESCRIPTION OF PROCEDURE:   After the risks benefits and alternatives of the procedure were thoroughly explained, informed consent was obtained.  The digital rectal exam revealed no abnormalities of the rectum.   The LB [id number]-[id number]  endoscope was introduced through the anus and advanced to the cecum, which was identified by both the appendix and ileocecal valve. No adverse events experienced.   The quality of the prep was excellent. (Suprep was used)  The instrument was then slowly withdrawn as the colon was fully examined. Estimated blood loss is zero unless otherwise noted in this procedure report.    COLON FINDINGS: A normal appearing cecum, ileocecal valve, and appendiceal orifice were identified.  The ascending, transverse, descending, sigmoid colon, and rectum appeared unremarkable. Retroflexed views revealed no abnormalities. The time to cecum = 2.75 min Withdrawal time = 9.5 min   The scope was withdrawn and the procedure completed. COMPLICATIONS: There were no immediate complications.  ENDOSCOPIC IMPRESSION: Normal colonoscopy  RECOMMENDATIONS: Continue current  colorectal screening recommendations for "routine risk" patients with a repeat colonoscopy in 10 years.  eSigned:  Ladene Artist, MD, [facility] 10/18/2015 11:16 AM     PATIENT NAME:  [name], [name] MR#: [id number]

## 2015-10-19 ENCOUNTER — Telehealth: Payer: Self-pay | Admitting: Emergency Medicine

## 2015-10-19 NOTE — Telephone Encounter (Signed)
Left message per f/u, no identifier present

## 2015-10-25 DIAGNOSIS — M25442 Effusion, left hand: Secondary | ICD-10-CM | POA: Insufficient documentation

## 2015-12-09 ENCOUNTER — Ambulatory Visit (INDEPENDENT_AMBULATORY_CARE_PROVIDER_SITE_OTHER): Payer: BLUE CROSS/BLUE SHIELD | Admitting: Physician Assistant

## 2015-12-09 VITALS — BP 142/80 | HR 73 | Temp 98.4°F | Resp 20 | Ht 65.0 in | Wt 133.8 lb

## 2015-12-09 DIAGNOSIS — R05 Cough: Secondary | ICD-10-CM

## 2015-12-09 DIAGNOSIS — R062 Wheezing: Secondary | ICD-10-CM

## 2015-12-09 DIAGNOSIS — R0981 Nasal congestion: Secondary | ICD-10-CM

## 2015-12-09 DIAGNOSIS — R059 Cough, unspecified: Secondary | ICD-10-CM

## 2015-12-09 LAB — POCT CBC
GRANULOCYTE PERCENT: 67.9 % (ref 37–80)
HEMATOCRIT: 41.9 % (ref 37.7–47.9)
HEMOGLOBIN: 14.5 g/dL (ref 12.2–16.2)
Lymph, poc: 1.5 (ref 0.6–3.4)
MCH, POC: 30.6 pg (ref 27–31.2)
MCHC: 34.7 g/dL (ref 31.8–35.4)
MCV: 88.2 fL (ref 80–97)
MID (cbc): 0.2 (ref 0–0.9)
MPV: 8.6 fL (ref 0–99.8)
POC GRANULOCYTE: 3.6 (ref 2–6.9)
POC LYMPH PERCENT: 27.8 %L (ref 10–50)
POC MID %: 4.3 % (ref 0–12)
Platelet Count, POC: 268 10*3/uL (ref 142–424)
RBC: 4.75 M/uL (ref 4.04–5.48)
RDW, POC: 12.8 %
WBC: 5.3 10*3/uL (ref 4.6–10.2)

## 2015-12-09 MED ORDER — PREDNISONE 20 MG PO TABS: ORAL_TABLET | ORAL | Status: AC

## 2015-12-09 MED ORDER — ALBUTEROL SULFATE (2.5 MG/3ML) 0.083% IN NEBU
2.5000 mg | INHALATION_SOLUTION | Freq: Once | RESPIRATORY_TRACT | Status: AC
  Administered 2015-12-09: 2.5 mg via RESPIRATORY_TRACT

## 2015-12-09 MED ORDER — CETIRIZINE-PSEUDOEPHEDRINE ER 5-120 MG PO TB12: 1.0000 | ORAL_TABLET | Freq: Two times a day (BID) | ORAL | Status: DC

## 2015-12-09 MED ORDER — IPRATROPIUM BROMIDE 0.02 % IN SOLN
0.5000 mg | Freq: Once | RESPIRATORY_TRACT | Status: AC
  Administered 2015-12-09: 0.5 mg via RESPIRATORY_TRACT

## 2015-12-09 MED ORDER — HYDROCODONE-HOMATROPINE 5-1.5 MG/5ML PO SYRP: 2.5000 mL | ORAL_SOLUTION | Freq: Every evening | ORAL | Status: DC | PRN

## 2015-12-09 NOTE — Progress Notes (Signed)
12/11/2015 12:56 PM   DOB: 1956/09/01 / MRN: [id number]  SUBJECTIVE:  [name] is a 60 y.o. female presenting for sore throat and cough that statred on 5 days. Associates a low grade fever near the beginning of the illness.  She has tried Mucinex sinus, flonase and pushing PO fluids without relief. She denies a history of asthma, reports a distant 5 pack year history.  She has not taken any antipyretics today.    She is allergic to codeine phosphate; oxycodone-acetaminophen; propoxyphene n-acetaminophen; and sulfamethoxazole.   She  has a past medical history of Hyperlipidemia; Acid reflux; Cancer [facility]); and Depression.    She  reports that she quit smoking about 23 years ago. She has never used smokeless tobacco. She reports that she does not drink alcohol or use illicit drugs. She  has no sexual activity history on file. The patient  has past surgical history that includes Dilation and curettage of uterus; Appendectomy; Tonsillectomy; Breast enhancement surgery; maxillofacial surgery; and Upper gastrointestinal endoscopy (2002).  Her family history includes Breast cancer in her maternal aunt; Emphysema in her mother; Heart disease in her father; Stroke in her father. There is no history of Colon cancer.  Review of Systems  Constitutional: Positive for fever.  Eyes: Negative for blurred vision.  Respiratory: Positive for cough. Negative for hemoptysis and sputum production.   Cardiovascular: Negative for chest pain and leg swelling.  Gastrointestinal: Negative for nausea.  Musculoskeletal: Positive for myalgias.  Skin: Negative for rash.  Neurological: Positive for headaches (sinus, frontal). Negative for dizziness.    Problem list and medications reviewed and updated by myself where necessary, and exist elsewhere in the encounter.   OBJECTIVE:  BP 142/80 mmHg  Pulse 73  Temp(Src) 98.4 F (36.9 C) (Oral)  Resp 20  Ht 5\' 5"  (1.651 m)  Wt 133 lb 12.8 oz (60.691 kg)  BMI 22.27  kg/m2  SpO2 98%  Physical Exam  Constitutional: She is oriented to person, place, and time. She appears well-developed.  Eyes: EOM are normal. Pupils are equal, round, and reactive to light.  Cardiovascular: Normal rate.   Pulmonary/Chest: Effort normal.  Abdominal: She exhibits no distension.  Musculoskeletal: Normal range of motion.  Neurological: She is alert and oriented to person, place, and time. No cranial nerve deficit.  Skin: Skin is warm and dry. She is not diaphoretic.  Psychiatric: She has a normal mood and affect.  Vitals reviewed.   Results for orders placed or performed in visit on 12/09/15 (from the past 48 hour(s))  POCT CBC     Status: Normal   Collection Time: 12/09/15  7:31 PM  Result Value Ref Range   WBC 5.3 4.6 - 10.2 K/uL   Lymph, poc 1.5 0.6 - 3.4   POC LYMPH PERCENT 27.8 10 - 50 %L   MID (cbc) 0.2 0 - 0.9   POC MID % 4.3 0 - 12 %M   POC Granulocyte 3.6 2 - 6.9   Granulocyte percent 67.9 37 - 80 %G   RBC 4.75 4.04 - 5.48 M/uL   Hemoglobin 14.5 12.2 - 16.2 g/dL   HCT, POC 41.9 37.7 - 47.9 %   MCV 88.2 80 - 97 fL   MCH, POC 30.6 27 - 31.2 pg   MCHC 34.7 31.8 - 35.4 g/dL   RDW, POC 12.8 %   Platelet Count, POC 268 142 - 424 K/uL   MPV 8.6 0 - 99.8 fL    ASSESSMENT AND PLAN  [name] was seen today for influenza, sore throat and cough.  Diagnoses and all orders for this visit:  Cough: Vitals and CBC reassuring. Most likely viral bronchitis with some mild wheezing on exam.  She has no chronic disease. Will start her on prednisone to reduce airway obstruction/inflammation.  -     POCT CBC -     HYDROcodone-homatropine (HYCODAN) 5-1.5 MG/5ML syrup; Take 2.5-5 mLs by mouth at bedtime as needed.  Nasal congestion -     cetirizine-pseudoephedrine (ZYRTEC-D) 5-120 MG tablet; Take 1 tablet by mouth 2 (two) times daily.  Wheezing -     albuterol (PROVENTIL) (2.5 MG/3ML) 0.083% nebulizer solution 2.5 mg; Take 3 mLs (2.5 mg total) by nebulization once. -      ipratropium (ATROVENT) nebulizer solution 0.5 mg; Take 2.5 mLs (0.5 mg total) by nebulization once. -     predniSONE (DELTASONE) 20 MG tablet; Take 3 in the morning for 3 days, then 2 in the morning for 3 days, and then 1 in the morning for 3 days.    The patient was advised to call or return to clinic if she does not see an improvement in symptoms or to seek the care of the closest emergency department if she worsens with the above plan.   Philis Fendt, MHS, PA-C [facility] 12/11/2015 12:56 PM

## 2015-12-16 ENCOUNTER — Telehealth: Payer: Self-pay | Admitting: Family Medicine

## 2015-12-16 ENCOUNTER — Telehealth: Payer: Self-pay

## 2015-12-16 ENCOUNTER — Ambulatory Visit (INDEPENDENT_AMBULATORY_CARE_PROVIDER_SITE_OTHER): Payer: BLUE CROSS/BLUE SHIELD | Admitting: Family Medicine

## 2015-12-16 VITALS — BP 142/86 | HR 68 | Temp 98.1°F | Resp 17 | Wt 133.0 lb

## 2015-12-16 DIAGNOSIS — R42 Dizziness and giddiness: Secondary | ICD-10-CM | POA: Diagnosis not present

## 2015-12-16 DIAGNOSIS — R339 Retention of urine, unspecified: Secondary | ICD-10-CM

## 2015-12-16 DIAGNOSIS — R110 Nausea: Secondary | ICD-10-CM | POA: Diagnosis not present

## 2015-12-16 DIAGNOSIS — H539 Unspecified visual disturbance: Secondary | ICD-10-CM | POA: Diagnosis not present

## 2015-12-16 DIAGNOSIS — R9431 Abnormal electrocardiogram [ECG] [EKG]: Secondary | ICD-10-CM

## 2015-12-16 DIAGNOSIS — R002 Palpitations: Secondary | ICD-10-CM | POA: Diagnosis not present

## 2015-12-16 LAB — POCT CBC
Granulocyte percent: 64 %G (ref 37–80)
HEMATOCRIT: 41.2 % (ref 37.7–47.9)
HEMOGLOBIN: 14.4 g/dL (ref 12.2–16.2)
Lymph, poc: 3.7 — AB (ref 0.6–3.4)
MCH: 30.6 pg (ref 27–31.2)
MCHC: 34.9 g/dL (ref 31.8–35.4)
MCV: 87.6 fL (ref 80–97)
MID (cbc): 0.1 (ref 0–0.9)
MPV: 8.5 fL (ref 0–99.8)
POC GRANULOCYTE: 6.8 (ref 2–6.9)
POC LYMPH PERCENT: 34.9 %L (ref 10–50)
POC MID %: 1.1 % (ref 0–12)
Platelet Count, POC: 314 10*3/uL (ref 142–424)
RBC: 4.7 M/uL (ref 4.04–5.48)
RDW, POC: 12.6 %
WBC: 10.7 10*3/uL — AB (ref 4.6–10.2)

## 2015-12-16 LAB — COMPREHENSIVE METABOLIC PANEL
ALBUMIN: 4.3 g/dL (ref 3.6–5.1)
ALT: 20 U/L (ref 6–29)
AST: 15 U/L (ref 10–35)
Alkaline Phosphatase: 65 U/L (ref 33–130)
BILIRUBIN TOTAL: 0.9 mg/dL (ref 0.2–1.2)
BUN: 18 mg/dL (ref 7–25)
CHLORIDE: 105 mmol/L (ref 98–110)
CO2: 26 mmol/L (ref 20–31)
CREATININE: 0.74 mg/dL (ref 0.50–1.05)
Calcium: 9.3 mg/dL (ref 8.6–10.4)
GLUCOSE: 108 mg/dL — AB (ref 65–99)
Potassium: 3.4 mmol/L — ABNORMAL LOW (ref 3.5–5.3)
SODIUM: 140 mmol/L (ref 135–146)
Total Protein: 6.7 g/dL (ref 6.1–8.1)

## 2015-12-16 LAB — GLUCOSE, POCT (MANUAL RESULT ENTRY): POC GLUCOSE: 105 mg/dL — AB (ref 70–99)

## 2015-12-16 MED ORDER — MECLIZINE HCL 12.5 MG PO TABS: 12.5000 mg | ORAL_TABLET | Freq: Three times a day (TID) | ORAL | Status: DC | PRN

## 2015-12-16 MED ORDER — ONDANSETRON 4 MG PO TBDP
8.0000 mg | ORAL_TABLET | Freq: Once | ORAL | Status: AC
  Administered 2015-12-16: 8 mg via ORAL

## 2015-12-16 NOTE — Patient Instructions (Signed)

## 2015-12-16 NOTE — Telephone Encounter (Signed)
I spoke to pt. Advised her to make sure she doesn't take anymore medication. She said her vision is getting better, but now her heart is racing and she feels dizzy. She is unsure if she can drive anywhere. Pt is going to call around and see if she can find anyone to take her to be seen.

## 2015-12-16 NOTE — Telephone Encounter (Signed)
She is here in office at this time--seen by Dr. Reginia Forts.

## 2015-12-16 NOTE — Progress Notes (Signed)
Subjective:    Patient ID: [name], female    DOB: 09/25/1956, 60 y.o.   MRN: [id number]  12/16/2015  Palpitations; Nausea; Dizziness; Tingling; Blurred Vision; and Headache   HPI This 60 y.o. female presents for evaluation of severe headache, vision changes.  Onset yesterday.  Continued to worsen.  Around 9:00pm, started drinking coke because of nausea; vision continued to worsen. Head is still pounding. Cannot urinate or poop. Extremely dizzy; heart racing; no sleep.    No fever/chills/sweats.  Headache frontal region dead center.  Severity 4/10.  Today, vision is clearing around 7:00am this morning.  No ear pain; no muffled hearing; no ear congestion; normal hearing; no tinnitus.  No sore throat.  No throat swelling.  Rhinorrhea mild clear; not thick.  Using Flonase.  Nasal congestion is better. No PND.  Dry cough; mild sputum clear; cough is better.  No SOB. Taking shallow breaths due to nausea.  Nausea is moderate to severe; no vomit; does not looking down or bend over to avoid dizziness.  Dizziness started last night.  Looking side to side does trigger dizziness. History of vertigo requiring hospitalization.  Unable to urinate; started last night.  Before last night, suffering with stress incontinence.  Last urination 7:00pm; has had 3 cokes regular and small amount of water.  Last bowel movement two days ago; regular daily b.m.s at baseline; usually has two b.m.s per day.  Has been eating and drinking well; decreased appetite with acute illness.   Eating soup and crackers.  Denies chest pain, SOB, jaw pain, neck pain, arm pain.    Prednisone rx provided one week ago; today was to decrease to one pill daily Zyrtec D did not take at all. Hycodan took for three days only.   Vivelle patch chronic rx; prometrium chronic. Vitamins also.  Did not take Cymbalta.    Drank lots of water yesterday.  Able to urinate yesterday.  Lives alone.    Blurred vision is connected to  dizziness.  Works at [employer].  Lives 1/2 mile from [facility].   Review of Systems  Constitutional: Positive for diaphoresis. Negative for fever, chills and fatigue.  HENT: Positive for congestion, postnasal drip and rhinorrhea. Negative for ear discharge, ear pain, sore throat, trouble swallowing and voice change.   Eyes: Positive for visual disturbance.  Respiratory: Positive for cough. Negative for shortness of breath, wheezing and stridor.   Cardiovascular: Positive for palpitations. Negative for chest pain and leg swelling.  Gastrointestinal: Positive for nausea. Negative for vomiting, abdominal pain and diarrhea.  Skin: Negative for rash.  Neurological: Positive for dizziness and headaches. Negative for tremors, seizures, syncope, facial asymmetry, speech difficulty, weakness, light-headedness and numbness.    Past Medical History  Diagnosis Date  . Hyperlipidemia   . Acid reflux   . Cancer ([place])     precancerous cervix area  . Depression    Past Surgical History  Procedure Laterality Date  . Dilation and curettage of uterus    . Appendectomy    . Tonsillectomy    . Breast enhancement surgery    . Maxillofacial surgery      for TMJ  . Upper gastrointestinal endoscopy  2002    Dr Michail Sermon   Allergies  Allergen Reactions  . Codeine Phosphate     REACTION: unspecified  . Darvon [Propoxyphene]   . Oxycodone-Acetaminophen     REACTION: unspecified  . Propoxyphene N-Acetaminophen     REACTION: unspecified  . Sulfamethoxazole  REACTION: unspecified    Social History   Social History  . Marital Status: Married    Spouse Name: N/A  . Number of Children: 0  . Years of Education: N/A   Occupational History  . retired    Social History Main Topics  . Smoking status: Former Smoker -- 0.30 packs/day for 4 years    Quit date: 12/04/1992  . Smokeless tobacco: Never Used  . Alcohol Use: No     Comment: quit 2.5 yrs ago  . Drug Use: No  . Sexual  Activity: Not on file   Other Topics Concern  . Not on file   Social History Narrative   Family History  Problem Relation Age of Onset  . Stroke Father 37    CVA multiple  . Heart disease Father   . Hyperlipidemia    . Breast cancer Maternal Aunt   . Emphysema Mother   . Colon cancer Neg Hx   . Cancer Sister     BREAST CANCER  . Alzheimer's disease Brother   . Diabetes Brother   . Cancer Sister     BREAST CANCER       Objective:    BP 142/86 mmHg  Pulse 68  Temp(Src) 98.1 F (36.7 C) (Oral)  Resp 17  Wt 133 lb (60.328 kg)  SpO2 99% Physical Exam  Constitutional: [name] is oriented to person, place, and time. [name] appears well-developed and well-nourished. [name] appears distressed.  HENT:  Head: Normocephalic and atraumatic.  Right Ear: Tympanic membrane, external ear and ear canal normal.  Left Ear: Tympanic membrane, external ear and ear canal normal.  Nose: Mucosal edema and rhinorrhea present. No sinus tenderness. No epistaxis. Right sinus exhibits no maxillary sinus tenderness and no frontal sinus tenderness. Left sinus exhibits no maxillary sinus tenderness and no frontal sinus tenderness.  Mouth/Throat: Uvula is midline, oropharynx is clear and moist and mucous membranes are normal. No oropharyngeal exudate, posterior oropharyngeal edema, posterior oropharyngeal erythema or tonsillar abscesses.  Eyes: Conjunctivae and EOM are normal. Pupils are equal, round, and reactive to light.  Neck: Normal range of motion. Neck supple. Carotid bruit is not present. No thyromegaly present.  Cardiovascular: Normal rate, regular rhythm, normal heart sounds and intact distal pulses.  Exam reveals no gallop and no friction rub.   No murmur heard. Pulmonary/Chest: Effort normal and breath sounds normal. [name] has no wheezes. [name] has no rales.  Abdominal: Soft. Bowel sounds are normal. [name] exhibits no distension and no mass. There is no tenderness. There is no rebound and no guarding.   Lymphadenopathy:    [name] has no cervical adenopathy.  Neurological: [name] is alert and oriented to person, place, and time. [name] has normal strength. No cranial nerve deficit or sensory deficit. [name] exhibits normal muscle tone. [name] displays a negative Romberg sign. Coordination and gait normal.  Dix Hallpike negative. Mild dizziness reproduced to the L.  Skin: Skin is warm and dry. No rash noted. [name] is not diaphoretic. No erythema. No pallor.  Psychiatric: [name] has a normal mood and affect. Her behavior is normal. Judgment and thought content normal.   Results for orders placed or performed in visit on 12/16/15  POCT CBC  Result Value Ref Range   WBC 10.7 (A) 4.6 - 10.2 K/uL   Lymph, poc 3.7 (A) 0.6 - 3.4   POC LYMPH PERCENT 34.9 10 - 50 %L   MID (cbc) 0.1 0 - 0.9   POC MID % 1.1 0 - 12 %  M   POC Granulocyte 6.8 2 - 6.9   Granulocyte percent 64.0 37 - 80 %G   RBC 4.70 4.04 - 5.48 M/uL   Hemoglobin 14.4 12.2 - 16.2 g/dL   HCT, POC 41.2 37.7 - 47.9 %   MCV 87.6 80 - 97 fL   MCH, POC 30.6 27 - 31.2 pg   MCHC 34.9 31.8 - 35.4 g/dL   RDW, POC 12.6 %   Platelet Count, POC 314 142 - 424 K/uL   MPV 8.5 0 - 99.8 fL  POCT glucose (manual entry)  Result Value Ref Range   POC Glucose 105 (A) 70 - 99 mg/dl   EKG: NSR; ST changes non-specific ST changes in II, V4-V6.     Assessment & Plan:   1. Dizziness   2. Nausea without vomiting   3. Vision changes   4. Palpitations   5. Urinary retention   6. Nonspecific abnormal electrocardiogram (ECG) (EKG)    -New. -Ddx includes new onset vertigo versus side effect to Prednisone. -Stop Prednisone. -nausea and dizziness and blurred vision greatly improved during visit.  S/p Zofran ODT 8mg  in office. -Normal neurological exam. -Supportive care with rest, fluids, Meclizine for nausea, dizziness. -RTC inability to urinate in upcoming eight hours. -No arrhythmia with EKG yet more pronounced ST changes from previous EKG tracings; pt due for annual  follow-up with cardiology; will route note to cardiology for review.  Advised patient to schedule appointment with cardiology; no cardiac symptoms today other than palpitations.  Denies chest pain, SOB, diaphoresis, jaw pain, back pain, arm pain.    Orders Placed This Encounter  Procedures  . Comprehensive metabolic panel  . POCT CBC  . POCT glucose (manual entry)  . EKG 12-Lead   Meds ordered this encounter  Medications  . ondansetron (ZOFRAN-ODT) disintegrating tablet 8 mg    Sig:   . meclizine (ANTIVERT) 12.5 MG tablet    Sig: Take 1 tablet (12.5 mg total) by mouth 3 (three) times daily as needed for dizziness.    Dispense:  30 tablet    Refill:  0    No Follow-up on file.    Rea Reser Elayne Guerin, M.D. [facility] [address] [place], [place]  [phone] phone [phone] fax

## 2015-12-16 NOTE — Telephone Encounter (Signed)
Patient was prescribed prednisone and is experience blurry vision and dizziness and wants to know what to do. She states that her vision is so bad that she can't see to drive. Please advise!

## 2015-12-16 NOTE — Telephone Encounter (Signed)
Patient wants Dr. Tamala Julian to know that she urinated and had a bowel movement when she got home from her appointment. She was grateful.   [phone]

## 2015-12-19 NOTE — Telephone Encounter (Signed)
Noted. No further action warranted. 

## 2016-01-29 ENCOUNTER — Telehealth: Payer: Self-pay | Admitting: Internal Medicine

## 2016-01-29 MED ORDER — OSELTAMIVIR PHOSPHATE 75 MG PO CAPS: 75.0000 mg | ORAL_CAPSULE | Freq: Every day | ORAL | Status: DC

## 2016-01-29 NOTE — Telephone Encounter (Signed)
Spoke to [name], exposed to Flu at work 3 people have it and [name] requesting Tamiflu. Discussed with Dr. Charlett Blake and she will send Tamiflu to pharmacy. [name] verbalized understanding. Verified pharmacy and Rx sent to pharmacy. Verbal order given for Tamilfu 75 mg one capsule daily x 7 days. Rx sent.

## 2016-01-29 NOTE — Telephone Encounter (Signed)
Patient Name: [name] DOB: 07/10/1956 Initial Comment Caller states she has sore throat, cough, congestion, getting over bronchitis, exposed to flu at work Nurse Assessment Nurse: Vallery Sa, RN, [place] Date/Time ([place] Time): 01/29/2016 10:43:07 AM Confirm and document reason for call. If symptomatic, describe symptoms. You must click the next button to save text entered. ---Caller states she was exposed to Flu at work and she developed a low grade fever, muscle aches/pain, and sore throat with a cough last night. No severe breathing difficulty. Alert and responsive. Has the patient traveled out of the country within the last 30 days? ---No Does the patient have any new or worsening symptoms? ---Yes Will a triage be completed? ---Yes Related visit to physician within the last 2 weeks? ---No Does the PT have any chronic conditions? (i.e. diabetes, asthma, etc.) ---Yes List chronic conditions. ---Bronchitis Jan 2017, Reflux Is this a behavioral health or substance abuse call? ---No Guidelines Guideline Title Affirmed Question Affirmed Notes Influenza - Seasonal [1] Patient is NOT HIGH RISK AND [2] strongly requests antiviral medicine AND [3] flu symptoms present < 48 hours Final Disposition User Call PCP within 24 Hours [place], RN, Uriah states she doesn't want to be seen today [facility] and asks if MD will call in an anti-viral for her. Called [facility] and connected her with Butch Penny. Referrals REFERRED TO PCP OFFICE Disagree/Comply: Comply

## 2016-04-28 DIAGNOSIS — H2513 Age-related nuclear cataract, bilateral: Secondary | ICD-10-CM | POA: Diagnosis not present

## 2016-06-21 DIAGNOSIS — D485 Neoplasm of uncertain behavior of skin: Secondary | ICD-10-CM | POA: Diagnosis not present

## 2016-06-21 DIAGNOSIS — L570 Actinic keratosis: Secondary | ICD-10-CM | POA: Diagnosis not present

## 2016-06-21 DIAGNOSIS — L738 Other specified follicular disorders: Secondary | ICD-10-CM | POA: Diagnosis not present

## 2016-06-21 DIAGNOSIS — L821 Other seborrheic keratosis: Secondary | ICD-10-CM | POA: Diagnosis not present

## 2016-07-13 ENCOUNTER — Encounter: Payer: Self-pay | Admitting: Internal Medicine

## 2016-07-25 ENCOUNTER — Encounter: Payer: BLUE CROSS/BLUE SHIELD | Admitting: Internal Medicine

## 2016-07-27 ENCOUNTER — Ambulatory Visit (INDEPENDENT_AMBULATORY_CARE_PROVIDER_SITE_OTHER): Payer: BLUE CROSS/BLUE SHIELD | Admitting: Internal Medicine

## 2016-07-27 DIAGNOSIS — F419 Anxiety disorder, unspecified: Secondary | ICD-10-CM

## 2016-07-27 MED ORDER — DULOXETINE HCL 20 MG PO CPEP: 20.0000 mg | ORAL_CAPSULE | Freq: Every day | ORAL | 3 refills | Status: DC

## 2016-07-27 MED ORDER — ALPRAZOLAM 0.25 MG PO TABS: 0.2500 mg | ORAL_TABLET | Freq: Three times a day (TID) | ORAL | 0 refills | Status: DC | PRN

## 2016-07-27 NOTE — Patient Instructions (Signed)
We have sent in the cymbalta and the xanax to start taking again.   If you can work on adding some stress relieving activities such as meditation into your routine.   Stress and Stress Management Stress is a normal reaction to life events. It is what you feel when life demands more than you are used to or more than you can handle. Some stress can be useful. For example, the stress reaction can help you catch the last bus of the day, study for a test, or meet a deadline at work. But stress that occurs too often or for too long can cause problems. It can affect your emotional health and interfere with relationships and normal daily activities. Too much stress can weaken your immune system and increase your risk for physical illness. If you already have a medical problem, stress can make it worse. CAUSES  All sorts of life events may cause stress. An event that causes stress for one person may not be stressful for another person. Major life events commonly cause stress. These may be positive or negative. Examples include losing your job, moving into a new home, getting married, having a baby, or losing a loved one. Less obvious life events may also cause stress, especially if they occur day after day or in combination. Examples include working long hours, driving in traffic, caring for children, being in debt, or being in a difficult relationship. SIGNS AND SYMPTOMS Stress may cause emotional symptoms including, the following:  Anxiety. This is feeling worried, afraid, on edge, overwhelmed, or out of control.  Anger. This is feeling irritated or impatient.  Depression. This is feeling sad, down, helpless, or guilty.  Difficulty focusing, remembering, or making decisions. Stress may cause physical symptoms, including the following:   Aches and pains. These may affect your head, neck, back, stomach, or other areas of your body.  Tight muscles or clenched jaw.  Low energy or trouble  sleeping. Stress may cause unhealthy behaviors, including the following:   Eating to feel better (overeating) or skipping meals.  Sleeping too little, too much, or both.  Working too much or putting off tasks (procrastination).  Smoking, drinking alcohol, or using drugs to feel better. DIAGNOSIS  Stress is diagnosed through an assessment by your health care provider. Your health care provider will ask questions about your symptoms and any stressful life events.Your health care provider will also ask about your medical history and may order blood tests or other tests. Certain medical conditions and medicine can cause physical symptoms similar to stress. Mental illness can cause emotional symptoms and unhealthy behaviors similar to stress. Your health care provider may refer you to a mental health professional for further evaluation.  TREATMENT  Stress management is the recommended treatment for stress.The goals of stress management are reducing stressful life events and coping with stress in healthy ways.  Techniques for reducing stressful life events include the following:  Stress identification. Self-monitor for stress and identify what causes stress for you. These skills may help you to avoid some stressful events.  Time management. Set your priorities, keep a calendar of events, and learn to say "no." These tools can help you avoid making too many commitments. Techniques for coping with stress include the following:  Rethinking the problem. Try to think realistically about stressful events rather than ignoring them or overreacting. Try to find the positives in a stressful situation rather than focusing on the negatives.  Exercise. Physical exercise can release both physical and emotional  tension. The key is to find a form of exercise you enjoy and do it regularly.  Relaxation techniques. These relax the body and mind. Examples include yoga, meditation, tai chi, biofeedback, deep  breathing, progressive muscle relaxation, listening to music, being out in nature, journaling, and other hobbies. Again, the key is to find one or more that you enjoy and can do regularly.  Healthy lifestyle. Eat a balanced diet, get plenty of sleep, and do not smoke. Avoid using alcohol or drugs to relax.  Strong support network. Spend time with family, friends, or other people you enjoy being around.Express your feelings and talk things over with someone you trust. Counseling or talktherapy with a mental health professional may be helpful if you are having difficulty managing stress on your own. Medicine is typically not recommended for the treatment of stress.Talk to your health care provider if you think you need medicine for symptoms of stress. HOME CARE INSTRUCTIONS  Keep all follow-up visits as directed by your health care provider.  Take all medicines as directed by your health care provider. SEEK MEDICAL CARE IF:  Your symptoms get worse or you start having new symptoms.  You feel overwhelmed by your problems and can no longer manage them on your own. SEEK IMMEDIATE MEDICAL CARE IF:  You feel like hurting yourself or someone else.   This information is not intended to replace advice given to you by your health care provider. Make sure you discuss any questions you have with your health care provider.   Document Released: 05/16/2001 Document Revised: 12/11/2014 Document Reviewed: 07/15/2013 Elsevier Interactive Patient Education [employer].

## 2016-07-27 NOTE — Progress Notes (Signed)
Pre visit review using our clinic review tool, if applicable. No additional management support is needed unless otherwise documented below in the visit note. 
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
## 2016-07-28 ENCOUNTER — Encounter: Payer: Self-pay | Admitting: Internal Medicine

## 2016-07-28 NOTE — Assessment & Plan Note (Signed)
Rx for cymbalta and xanax for her increased stress right now. She will follow up in about 4-6 weeks for efficacy.

## 2016-08-18 DIAGNOSIS — Z23 Encounter for immunization: Secondary | ICD-10-CM | POA: Diagnosis not present

## 2016-08-31 ENCOUNTER — Other Ambulatory Visit (INDEPENDENT_AMBULATORY_CARE_PROVIDER_SITE_OTHER): Payer: BLUE CROSS/BLUE SHIELD

## 2016-08-31 ENCOUNTER — Ambulatory Visit (INDEPENDENT_AMBULATORY_CARE_PROVIDER_SITE_OTHER): Payer: BLUE CROSS/BLUE SHIELD | Admitting: Nurse Practitioner

## 2016-08-31 ENCOUNTER — Encounter: Payer: Self-pay | Admitting: Nurse Practitioner

## 2016-08-31 VITALS — BP 127/78 | HR 82 | Temp 97.4°F | Ht 65.0 in | Wt 127.0 lb

## 2016-08-31 DIAGNOSIS — F419 Anxiety disorder, unspecified: Secondary | ICD-10-CM | POA: Diagnosis not present

## 2016-08-31 DIAGNOSIS — E785 Hyperlipidemia, unspecified: Secondary | ICD-10-CM

## 2016-08-31 DIAGNOSIS — R6889 Other general symptoms and signs: Secondary | ICD-10-CM

## 2016-08-31 DIAGNOSIS — Z0001 Encounter for general adult medical examination with abnormal findings: Secondary | ICD-10-CM

## 2016-08-31 LAB — LIPID PANEL
CHOLESTEROL: 229 mg/dL — AB (ref 0–200)
HDL: 44.3 mg/dL (ref >39.00)
LDL CALC: 162 mg/dL — AB (ref 0–99)
NonHDL: 184.63
Total CHOL/HDL Ratio: 5
Triglycerides: 111 mg/dL (ref 0.0–149.0)
VLDL: 22.2 mg/dL (ref 0.0–40.0)

## 2016-08-31 MED ORDER — ASPIRIN EC 81 MG PO TBEC: 81.0000 mg | DELAYED_RELEASE_TABLET | Freq: Every day | ORAL | 1 refills | Status: DC

## 2016-08-31 MED ORDER — EZETIMIBE 10 MG PO TABS: 10.0000 mg | ORAL_TABLET | Freq: Every day | ORAL | 2 refills | Status: DC

## 2016-08-31 MED ORDER — SIMVASTATIN 20 MG PO TABS
20.0000 mg | ORAL_TABLET | Freq: Every day | ORAL | 2 refills | Status: DC
Start: 2016-08-31 — End: 2017-01-02

## 2016-08-31 NOTE — Patient Instructions (Addendum)
Will call with lab results   Fat and Cholesterol Restricted Diet Getting too much fat and cholesterol in your diet may cause health problems. Following this diet helps keep your fat and cholesterol at normal levels. This can keep you from getting sick. WHAT TYPES OF FAT SHOULD I CHOOSE?  Choose monosaturated and polyunsaturated fats. These are found in foods such as olive oil, canola oil, flaxseeds, walnuts, almonds, and seeds.  Eat more omega-3 fats. Good choices include salmon, mackerel, sardines, tuna, flaxseed oil, and ground flaxseeds.  Limit saturated fats. These are in animal products such as meats, butter, and cream. They can also be in plant products such as palm oil, palm kernel oil, and coconut oil.   Avoid foods with partially hydrogenated oils in them. These contain trans fats. Examples of foods that have trans fats are stick margarine, some tub margarines, cookies, crackers, and other baked goods. WHAT GENERAL GUIDELINES DO I NEED TO FOLLOW?   Check food labels. Look for the words "trans fat" and "saturated fat."  When preparing a meal:  Fill half of your plate with vegetables and green salads.  Fill one fourth of your plate with whole grains. Look for the word "whole" as the first word in the ingredient list.  Fill one fourth of your plate with lean protein foods.  Limit fruit to two servings a day. Choose fruit instead of juice.  Eat more foods with soluble fiber. Examples of foods with this type of fiber are apples, broccoli, carrots, beans, peas, and barley. Try to get 20-30 g (grams) of fiber per day.  Eat more home-cooked foods. Eat less at restaurants and buffets.  Limit or avoid alcohol.  Limit foods high in starch and sugar.  Limit fried foods.  Cook foods without frying them. Baking, boiling, grilling, and broiling are all great options.  Lose weight if you are overweight. Losing even a small amount of weight can help your overall health. It can also  help prevent diseases such as diabetes and heart disease. WHAT FOODS CAN I EAT? Grains Whole grains, such as whole wheat or whole grain breads, crackers, cereals, and pasta. Unsweetened oatmeal, bulgur, barley, quinoa, or brown rice. Corn or whole wheat flour tortillas. Vegetables Fresh or frozen vegetables (raw, steamed, roasted, or grilled). Green salads. Fruits All fresh, canned (in natural juice), or frozen fruits. Meat and Other Protein Products Ground beef (85% or leaner), grass-fed beef, or beef trimmed of fat. Skinless chicken or [place]. Ground chicken or [place]. Pork trimmed of fat. All fish and seafood. Eggs. Dried beans, peas, or lentils. Unsalted nuts or seeds. Unsalted canned or dry beans. Dairy Low-fat dairy products, such as skim or 1% milk, 2% or reduced-fat cheeses, low-fat ricotta or cottage cheese, or plain low-fat yogurt. Fats and Oils Tub margarines without trans fats. Light or reduced-fat mayonnaise and salad dressings. Avocado. Olive, canola, sesame, or safflower oils. Natural peanut or almond butter (choose ones without added sugar and oil). The items listed above may not be a complete list of recommended foods or beverages. Contact your dietitian for more options. WHAT FOODS ARE NOT RECOMMENDED? Grains White bread. White pasta. White rice. Cornbread. Bagels, pastries, and croissants. Crackers that contain trans fat. Vegetables White potatoes. Corn. Creamed or fried vegetables. Vegetables in a cheese sauce. Fruits Dried fruits. Canned fruit in light or heavy syrup. Fruit juice. Meat and Other Protein Products Fatty cuts of meat. Ribs, chicken wings, bacon, sausage, bologna, salami, chitterlings, fatback, hot dogs, bratwurst, and packaged  luncheon meats. Liver and organ meats. Dairy Whole or 2% milk, cream, half-and-half, and cream cheese. Whole milk cheeses. Whole-fat or sweetened yogurt. Full-fat cheeses. Nondairy creamers and whipped toppings. Processed cheese,  cheese spreads, or cheese curds. Sweets and Desserts Corn syrup, sugars, honey, and molasses. Candy. Jam and jelly. Syrup. Sweetened cereals. Cookies, pies, cakes, donuts, muffins, and ice cream. Fats and Oils Butter, stick margarine, lard, shortening, ghee, or bacon fat. Coconut, palm kernel, or palm oils. Beverages Alcohol. Sweetened drinks (such as sodas, lemonade, and fruit drinks or punches). The items listed above may not be a complete list of foods and beverages to avoid. Contact your dietitian for more information.   This information is not intended to replace advice given to you by your health care provider. Make sure you discuss any questions you have with your health care provider.   Document Released: 05/21/2012 Document Revised: 12/11/2014 Document Reviewed: 02/19/2014 Elsevier Interactive Patient Education [employer].

## 2016-08-31 NOTE — Progress Notes (Signed)
Subjective:  Patient ID: [name], female    DOB: 1956/06/02  Age: 60 y.o. MRN: [id number]  CC: Panic Attack (Pt stated she doing much better) and Hyperlipidemia (stopped taking vytorin due to cost)   Hyperlipidemia  This is a chronic problem. The current episode started more than 1 year ago. The problem is uncontrolled. Recent lipid tests were reviewed and are high. She has no history of chronic renal disease, diabetes, hypothyroidism, liver disease, obesity or nephrotic syndrome. There are no known factors aggravating her hyperlipidemia. Pertinent negatives include no chest pain, leg pain, myalgias or shortness of breath. She is currently on no antihyperlipidemic treatment (stopped vytorin last year due to cost. developed myalgia with pravastatin). Compliance problems include adherence to diet, medication side effects and medication cost.  Risk factors for coronary artery disease include dyslipidemia, family history and stress.  Anxiety  Presents for follow-up visit. Patient reports no chest pain, compulsions, depressed mood, excessive worry, muscle tension, nervous/anxious behavior, obsessions, palpitations, panic, shortness of breath or suicidal ideas. Symptoms occur rarely. The severity of symptoms is mild. The quality of sleep is fair.   Compliance with medications is 76-100%. Treatment side effects: none.  states mood has improved with cymbalta.  Outpatient Medications Prior to Visit  Medication Sig Dispense Refill  . ALPRAZolam (XANAX) 0.25 MG tablet Take 1 tablet (0.25 mg total) by mouth 3 (three) times daily as needed for sleep or anxiety. 60 tablet 0  . Biotin (BIOTIN 5000) 5 MG CAPS Take 1 capsule by mouth daily.      . Calcium Citrate-Vitamin D (CITRACAL/VITAMIN D) 250-200 MG-UNIT TABS Take 2 tablets by mouth daily.      . cetirizine-pseudoephedrine (ZYRTEC-D) 5-120 MG tablet Take 1 tablet by mouth 2 (two) times daily. 14 tablet 0  . DULoxetine (CYMBALTA) 20 MG capsule Take 1  capsule (20 mg total) by mouth daily. 90 capsule 3  . estradiol (VIVELLE-DOT) 0.05 MG/24HR patch Place 1 patch onto the skin once a week.    . fluticasone (FLONASE) 50 MCG/ACT nasal spray As needed for allergies  0  . Glucosamine Sulfate 1000 MG CAPS Take by mouth.    . meclizine (ANTIVERT) 12.5 MG tablet Take 1 tablet (12.5 mg total) by mouth 3 (three) times daily as needed for dizziness. 30 tablet 0  . Omega-3 Fatty Acids (FISH OIL PO) Take 2,000 mg by mouth 2 (two) times daily.    . progesterone (PROMETRIUM) 100 MG capsule Take 100 mg by mouth daily.      . rosuvastatin (CRESTOR) 10 MG tablet Take 1 tablet (10 mg total) by mouth daily. 90 tablet 3   No facility-administered medications prior to visit.     ROS See HPI  Objective:  BP 127/78 (BP Location: Left Arm, Patient Position: Sitting, Cuff Size: Normal)   Pulse 82   Temp 97.4 F (36.3 C)   Ht 5\' 5"  (1.651 m)   Wt 127 lb (57.6 kg)   SpO2 98%   BMI 21.13 kg/m   BP Readings from Last 3 Encounters:  08/31/16 127/78  07/27/16 134/86  12/16/15 (!) 142/86    Wt Readings from Last 3 Encounters:  08/31/16 127 lb (57.6 kg)  07/27/16 133 lb (60.3 kg)  12/16/15 133 lb (60.3 kg)    Physical Exam  Constitutional: She is oriented to person, place, and time. No distress.  HENT:  Right Ear: External ear normal.  Left Ear: External ear normal.  Nose: Nose normal.  Mouth/Throat: Oropharynx is  clear and moist. No oropharyngeal exudate.  Eyes: Conjunctivae and EOM are normal. Pupils are equal, round, and reactive to light. No scleral icterus.  Neck: Normal range of motion. Neck supple.  Cardiovascular: Normal rate, regular rhythm and normal heart sounds.   Pulmonary/Chest: Effort normal and breath sounds normal. No respiratory distress.  Abdominal: Soft. Bowel sounds are normal. She exhibits no distension.  Musculoskeletal: Normal range of motion. She exhibits no edema.  Lymphadenopathy:    She has no cervical adenopathy.    Neurological: She is alert and oriented to person, place, and time. She has normal reflexes.  Skin: Skin is warm and dry.  Psychiatric: She has a normal mood and affect. Her behavior is normal.  Vitals reviewed.   Lab Results  Component Value Date   WBC 10.7 (A) 12/16/2015   HGB 14.4 12/16/2015   HCT 41.2 12/16/2015   PLT 279.0 07/26/2015   GLUCOSE 108 (H) 12/16/2015   CHOL 229 (H) 08/31/2016   TRIG 111.0 08/31/2016   HDL 44.30 08/31/2016   LDLCALC 162 (H) 08/31/2016   ALT 20 12/16/2015   AST 15 12/16/2015   NA 140 12/16/2015   K 3.4 (L) 12/16/2015   CL 105 12/16/2015   CREATININE 0.74 12/16/2015   BUN 18 12/16/2015   CO2 26 12/16/2015   TSH 2.46 07/26/2015    No results found.  Assessment & Plan:   [name] was seen today for panic attack and hyperlipidemia.  Diagnoses and all orders for this visit:  Encounter for preventative adult health care exam with abnormal findings  Anxiety  Hyperlipidemia -     Cancel: Lipid Profile; Future -     Lipid panel -     ezetimibe (ZETIA) 10 MG tablet; Take 1 tablet (10 mg total) by mouth daily. -     simvastatin (ZOCOR) 20 MG tablet; Take 1 tablet (20 mg total) by mouth daily at 6 PM.  Other orders -     aspirin EC 81 MG tablet; Take 1 tablet (81 mg total) by mouth daily.   I have discontinued Ms. Mccain's rosuvastatin. I am also having her start on aspirin EC, ezetimibe, and simvastatin. Additionally, I am having her maintain her Biotin, Calcium Citrate-Vitamin D, progesterone, estradiol, Omega-3 Fatty Acids (FISH OIL PO), fluticasone, Glucosamine Sulfate, cetirizine-pseudoephedrine, meclizine, ALPRAZolam, and DULoxetine.  Meds ordered this encounter  Medications  . aspirin EC 81 MG tablet    Sig: Take 1 tablet (81 mg total) by mouth daily.    Dispense:  30 tablet    Refill:  1    Order Specific Question:   Supervising Provider    Answer:   Cassandria Anger [[id number]]  . ezetimibe (ZETIA) 10 MG tablet    Sig: Take 1  tablet (10 mg total) by mouth daily.    Dispense:  30 tablet    Refill:  2    Order Specific Question:   Supervising Provider    Answer:   Cassandria Anger [[id number]]  . simvastatin (ZOCOR) 20 MG tablet    Sig: Take 1 tablet (20 mg total) by mouth daily at 6 PM.    Dispense:  30 tablet    Refill:  2    Order Specific Question:   Supervising Provider    Answer:   Cassandria Anger [[id number]]    Follow-up: Return in about 3 months (around 11/30/2016) for hyperlipidermia.  Wilfred Lacy, NP

## 2016-08-31 NOTE — Assessment & Plan Note (Signed)
Continue current dose of cymbalta.

## 2016-10-12 DIAGNOSIS — L821 Other seborrheic keratosis: Secondary | ICD-10-CM | POA: Diagnosis not present

## 2016-10-16 ENCOUNTER — Ambulatory Visit (INDEPENDENT_AMBULATORY_CARE_PROVIDER_SITE_OTHER): Payer: BLUE CROSS/BLUE SHIELD | Admitting: Urgent Care

## 2016-10-16 ENCOUNTER — Emergency Department (HOSPITAL_COMMUNITY)
Admission: EM | Admit: 2016-10-16 | Discharge: 2016-10-17 | Disposition: A | Discharge status: Home / Self Care | Payer: BLUE CROSS/BLUE SHIELD | Source: Home / Self Care | Attending: Emergency Medicine | Admitting: Emergency Medicine

## 2016-10-16 ENCOUNTER — Encounter (HOSPITAL_COMMUNITY): Payer: Self-pay | Admitting: Emergency Medicine

## 2016-10-16 VITALS — BP 128/76 | HR 85 | Temp 98.0°F | Resp 16 | Ht 65.0 in | Wt 125.0 lb

## 2016-10-16 DIAGNOSIS — K219 Gastro-esophageal reflux disease without esophagitis: Secondary | ICD-10-CM | POA: Diagnosis not present

## 2016-10-16 DIAGNOSIS — Z7982 Long term (current) use of aspirin: Secondary | ICD-10-CM | POA: Insufficient documentation

## 2016-10-16 DIAGNOSIS — N132 Hydronephrosis with renal and ureteral calculous obstruction: Secondary | ICD-10-CM

## 2016-10-16 DIAGNOSIS — R1031 Right lower quadrant pain: Secondary | ICD-10-CM | POA: Diagnosis not present

## 2016-10-16 DIAGNOSIS — R109 Unspecified abdominal pain: Secondary | ICD-10-CM | POA: Diagnosis not present

## 2016-10-16 DIAGNOSIS — N200 Calculus of kidney: Secondary | ICD-10-CM | POA: Diagnosis not present

## 2016-10-16 DIAGNOSIS — F329 Major depressive disorder, single episode, unspecified: Secondary | ICD-10-CM | POA: Diagnosis not present

## 2016-10-16 DIAGNOSIS — R112 Nausea with vomiting, unspecified: Secondary | ICD-10-CM

## 2016-10-16 DIAGNOSIS — R8290 Unspecified abnormal findings in urine: Secondary | ICD-10-CM | POA: Diagnosis not present

## 2016-10-16 DIAGNOSIS — Z8541 Personal history of malignant neoplasm of cervix uteri: Secondary | ICD-10-CM

## 2016-10-16 DIAGNOSIS — N10 Acute tubulo-interstitial nephritis: Secondary | ICD-10-CM

## 2016-10-16 DIAGNOSIS — F419 Anxiety disorder, unspecified: Secondary | ICD-10-CM | POA: Diagnosis not present

## 2016-10-16 DIAGNOSIS — Z87891 Personal history of nicotine dependence: Secondary | ICD-10-CM

## 2016-10-16 DIAGNOSIS — Z79899 Other long term (current) drug therapy: Secondary | ICD-10-CM | POA: Diagnosis not present

## 2016-10-16 DIAGNOSIS — E785 Hyperlipidemia, unspecified: Secondary | ICD-10-CM | POA: Diagnosis not present

## 2016-10-16 LAB — POCT CBC
Granulocyte percent: 82.8 %G — AB (ref 37–80)
HCT, POC: 40.9 % (ref 37.7–47.9)
HEMOGLOBIN: 14.7 g/dL (ref 12.2–16.2)
LYMPH, POC: 2.1 (ref 0.6–3.4)
MCH, POC: 31.4 pg — AB (ref 27–31.2)
MCHC: 35.8 g/dL — AB (ref 31.8–35.4)
MCV: 87.5 fL (ref 80–97)
MID (cbc): 0.6 (ref 0–0.9)
MPV: 9 fL (ref 0–99.8)
POC Granulocyte: 13 — AB (ref 2–6.9)
POC LYMPH PERCENT: 13.1 %L (ref 10–50)
POC MID %: 4.1 %M (ref 0–12)
Platelet Count, POC: 255 10*3/uL (ref 142–424)
RBC: 4.68 M/uL (ref 4.04–5.48)
RDW, POC: 12.9 %
WBC: 15.7 10*3/uL — AB (ref 4.6–10.2)

## 2016-10-16 LAB — CBC WITH DIFFERENTIAL/PLATELET
BASOS PCT: 0 %
Basophils Absolute: 0 10*3/uL (ref 0.0–0.1)
EOS ABS: 0 10*3/uL (ref 0.0–0.7)
EOS PCT: 0 %
HCT: 42 % (ref 36.0–46.0)
Hemoglobin: 14.5 g/dL (ref 12.0–15.0)
LYMPHS ABS: 1.2 10*3/uL (ref 0.7–4.0)
Lymphocytes Relative: 6 %
MCH: 30 pg (ref 26.0–34.0)
MCHC: 34.5 g/dL (ref 30.0–36.0)
MCV: 86.8 fL (ref 78.0–100.0)
MONOS PCT: 4 %
Monocytes Absolute: 0.7 10*3/uL (ref 0.1–1.0)
Neutro Abs: 16.9 10*3/uL — ABNORMAL HIGH (ref 1.7–7.7)
Neutrophils Relative %: 90 %
PLATELETS: 275 10*3/uL (ref 150–400)
RBC: 4.84 MIL/uL (ref 3.87–5.11)
RDW: 13 % (ref 11.5–15.5)
WBC: 18.8 10*3/uL — AB (ref 4.0–10.5)

## 2016-10-16 LAB — COMPREHENSIVE METABOLIC PANEL
ALBUMIN: 4.6 g/dL (ref 3.5–5.0)
ALT: 14 U/L (ref 14–54)
ANION GAP: 10 (ref 5–15)
AST: 27 U/L (ref 15–41)
Alkaline Phosphatase: 60 U/L (ref 38–126)
BUN: 15 mg/dL (ref 6–20)
CHLORIDE: 106 mmol/L (ref 101–111)
CO2: 24 mmol/L (ref 22–32)
Calcium: 9.8 mg/dL (ref 8.9–10.3)
Creatinine, Ser: 1.17 mg/dL — ABNORMAL HIGH (ref 0.44–1.00)
GFR calc non Af Amer: 50 mL/min — ABNORMAL LOW (ref >60)
GFR, EST AFRICAN AMERICAN: 57 mL/min — AB (ref >60)
Glucose, Bld: 133 mg/dL — ABNORMAL HIGH (ref 65–99)
POTASSIUM: 3.6 mmol/L (ref 3.5–5.1)
SODIUM: 140 mmol/L (ref 135–145)
Total Bilirubin: 2.1 mg/dL — ABNORMAL HIGH (ref 0.3–1.2)
Total Protein: 7.4 g/dL (ref 6.5–8.1)

## 2016-10-16 LAB — POCT URINALYSIS DIP (MANUAL ENTRY)
BILIRUBIN UA: NEGATIVE
Glucose, UA: NEGATIVE
LEUKOCYTES UA: NEGATIVE
NITRITE UA: NEGATIVE
PH UA: 7
Spec Grav, UA: 1.02
Urobilinogen, UA: 0.2

## 2016-10-16 LAB — POC MICROSCOPIC URINALYSIS (UMFC): Mucus: ABSENT

## 2016-10-16 LAB — I-STAT CG4 LACTIC ACID, ED: LACTIC ACID, VENOUS: 1.02 mmol/L (ref 0.5–1.9)

## 2016-10-16 MED ORDER — CIPROFLOXACIN HCL 500 MG PO TABS: 500.0000 mg | ORAL_TABLET | Freq: Two times a day (BID) | ORAL | 0 refills | Status: DC

## 2016-10-16 MED ORDER — CIPROFLOXACIN HCL 250 MG PO TABS: 500.0000 mg | ORAL_TABLET | Freq: Once | ORAL | Status: DC

## 2016-10-16 MED ORDER — MORPHINE SULFATE (PF) 4 MG/ML IV SOLN
4.0000 mg | Freq: Once | INTRAVENOUS | Status: AC
  Administered 2016-10-16: 4 mg via INTRAVENOUS
  Filled 2016-10-16: qty 1

## 2016-10-16 MED ORDER — SODIUM CHLORIDE 0.9 % IV BOLUS (SEPSIS)
1000.0000 mL | Freq: Once | INTRAVENOUS | Status: AC
  Administered 2016-10-16: 2000 mL via INTRAVENOUS

## 2016-10-16 MED ORDER — ONDANSETRON 8 MG PO TBDP: 8.0000 mg | ORAL_TABLET | Freq: Three times a day (TID) | ORAL | 0 refills | Status: DC | PRN

## 2016-10-16 MED ORDER — KETOROLAC TROMETHAMINE 30 MG/ML IJ SOLN
30.0000 mg | Freq: Once | INTRAMUSCULAR | Status: AC
  Administered 2016-10-16: 30 mg via INTRAVENOUS
  Filled 2016-10-16: qty 1

## 2016-10-16 MED ORDER — CEFTRIAXONE SODIUM 1 G IJ SOLR
1.0000 g | Freq: Once | INTRAMUSCULAR | Status: AC
  Administered 2016-10-16: 1 g via INTRAMUSCULAR

## 2016-10-16 MED ORDER — ONDANSETRON 4 MG PO TBDP
8.0000 mg | ORAL_TABLET | Freq: Once | ORAL | Status: AC
  Administered 2016-10-16: 8 mg via ORAL

## 2016-10-16 MED ORDER — ONDANSETRON HCL 4 MG/2ML IJ SOLN
4.0000 mg | Freq: Once | INTRAMUSCULAR | Status: AC
  Administered 2016-10-16: 4 mg via INTRAVENOUS
  Filled 2016-10-16: qty 2

## 2016-10-16 MED ORDER — SODIUM CHLORIDE 0.9 % IV BOLUS (SEPSIS): 1000.0000 mL | Freq: Once | INTRAVENOUS | Status: DC

## 2016-10-16 MED ORDER — FLUCONAZOLE 150 MG PO TABS: 150.0000 mg | ORAL_TABLET | ORAL | 0 refills | Status: DC

## 2016-10-16 MED ORDER — SODIUM CHLORIDE 0.9 % IV BOLUS (SEPSIS)
1000.0000 mL | Freq: Once | INTRAVENOUS | Status: AC
  Administered 2016-10-17: 1000 mL via INTRAVENOUS

## 2016-10-16 NOTE — ED Triage Notes (Signed)
Pt. reports right lower back pain with emesis and diarrhea onset today , seen at [facility] today diagnosed with UTI /prescribed with Cipro .

## 2016-10-16 NOTE — Patient Instructions (Signed)

## 2016-10-16 NOTE — ED Provider Notes (Signed)
By signing my name below, I, Dora Sims, attest that this documentation has been prepared under the direction and in the presence of physician practitioner, Delice Bison Aliciana Ricciardi, DO. Electronically Signed: Dora Sims, Scribe. 10/16/2016. 11:25 PM.  TIME SEEN: 11:25 PM  CHIEF COMPLAINT: Nausea and Vomiting  HPI: HPI Comments: [name] is a 60 y.o. female with PMHx significant for acid reflux, HLD who presents to the Emergency Department complaining of constant nausea and vomiting beginning this afternoon. Pt states she started experiencing intermittent right lower back pain at 3 AM this morning that has worsened throughout the day. Pt reports she gradually developed dark urine and N/V throughout the day as well. She also notes one episode of diarrhea today. Pt went to Urgent Care this afternoon and was diagnosed with a kidney infection. She was administered IM ceftriaxone and was prescribed Cipro and Zofran; she notes the Zofran relieved her nausea but only for a brief time. Unable to keep down her oral Cipro. She denies h/o kidney stones or kidney infection. She denies back pain exacerbation with palpation to her lower back. No known injury. She further denies fever, chills, dysuria, hematuria, numbness/tingling, weakness, incontinence or urinary retention or any other associated symptoms. Denies abdominal pain. Symptoms of nausea, vomiting and pain progressed, she decided to come to the emergency department for further evaluation.  PCP - Velora Heckler   ROS: See HPI Constitutional: no fever  Eyes: no drainage  ENT: no runny nose   Cardiovascular:  no chest pain  Resp: no SOB  GI: nausea, vomiting, no abdominal pain GU: no dysuria Integumentary: no rash  Allergy: no hives  Musculoskeletal: no leg swelling  Neurological: no slurred speech ROS otherwise negative  PAST MEDICAL HISTORY/PAST SURGICAL HISTORY:  Past Medical History:  Diagnosis Date  . Acid reflux   . 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 Depression   . Hyperlipidemia     MEDICATIONS:  Prior to Admission medications   Medication Sig Start Date End Date Taking? Authorizing Provider  ALPRAZolam (XANAX) 0.25 MG tablet Take 1 tablet (0.25 mg total) by mouth 3 (three) times daily as needed for sleep or anxiety. 07/27/16   Hoyt Koch, MD  aspirin EC 81 MG tablet Take 1 tablet (81 mg total) by mouth daily. 08/31/16   Flossie Buffy, NP  Biotin (BIOTIN 5000) 5 MG CAPS Take 1 capsule by mouth daily.      Historical Provider, MD  Calcium Citrate-Vitamin D (CITRACAL/VITAMIN D) 250-200 MG-UNIT TABS Take 2 tablets by mouth daily.      Historical Provider, MD  ciprofloxacin (CIPRO) 500 MG tablet Take 1 tablet (500 mg total) by mouth 2 (two) times daily. 10/16/16   Jaynee Eagles, PA-C  DULoxetine (CYMBALTA) 20 MG capsule Take 1 capsule (20 mg total) by mouth daily. 07/27/16   Hoyt Koch, MD  estradiol (VIVELLE-DOT) 0.05 MG/24HR patch Place 1 patch onto the skin once a week.    Historical Provider, MD  ezetimibe (ZETIA) 10 MG tablet Take 1 tablet (10 mg total) by mouth daily. 08/31/16   Flossie Buffy, NP  fluconazole (DIFLUCAN) 150 MG tablet Take 1 tablet (150 mg total) by mouth once a week. Repeat if needed 10/16/16   Jaynee Eagles, PA-C  Glucosamine Sulfate 1000 MG CAPS Take by mouth.    Historical Provider, MD  meclizine (ANTIVERT) 12.5 MG tablet Take 1 tablet (12.5 mg total) by mouth 3 (three) times daily as needed for dizziness. 12/16/15  Wardell Honour, MD  Omega-3 Fatty Acids (FISH OIL PO) Take 2,000 mg by mouth 2 (two) times daily.    Historical Provider, MD  ondansetron (ZOFRAN-ODT) 8 MG disintegrating tablet Take 1 tablet (8 mg total) by mouth every 8 (eight) hours as needed for nausea. 10/16/16   Jaynee Eagles, PA-C  progesterone (PROMETRIUM) 100 MG capsule Take 100 mg by mouth daily.      Historical Provider, MD  simvastatin (ZOCOR) 20 MG tablet Take 1 tablet (20 mg total) by mouth daily  at 6 PM. 08/31/16   Flossie Buffy, NP    ALLERGIES:  Allergies  Allergen Reactions  . Codeine Phosphate     REACTION: unspecified  . Darvon [Propoxyphene]   . Oxycodone-Acetaminophen     REACTION: unspecified  . Propoxyphene N-Acetaminophen     REACTION: unspecified  . Sulfamethoxazole     REACTION: unspecified    SOCIAL HISTORY:  Social History  Substance Use Topics  . Smoking status: Former Smoker    Packs/day: 0.30    Years: 4.00    Quit date: 12/04/1992  . Smokeless tobacco: Never Used  . Alcohol use No     Comment: quit 2.5 yrs ago    FAMILY HISTORY: Family History  Problem Relation Age of Onset  . Stroke Father 79    CVA multiple  . Heart disease Father   . Emphysema Mother   . Cancer Sister     BREAST CANCER  . Alzheimer's disease Brother   . Diabetes Brother   . Stroke Brother   . Cancer Sister     BREAST CANCER  . Breast cancer Maternal Aunt   . Colon cancer Neg Hx     EXAM: BP 95/65 (BP Location: Left Arm)   Pulse 88   Resp 20   SpO2 100%  CONSTITUTIONAL: Alert and oriented and responds appropriately to questions. Well-appearing; well-nourished. Appears uncomfortable HEAD: Normocephalic EYES: Conjunctivae clear, PERRL, EOMI ENT: normal nose; no rhinorrhea; moist mucous membranes NECK: Supple, no meningismus, no nuchal rigidity, no LAD  CARD: RRR; S1 and S2 appreciated; no murmurs, no clicks, no rubs, no gallops RESP: Normal chest excursion without splinting or tachypnea; breath sounds clear and equal bilaterally; no wheezes, no rhonchi, no rales, no hypoxia or respiratory distress, speaking full sentences ABD/GI: Normal bowel sounds; non-distended; soft, non-tender, no rebound, no guarding, no peritoneal signs, no hepatosplenomegaly BACK:  The back appears normal and is non-tender to palpation, there is no CVA tenderness, no midline spinal tenderness, step off, or deformity EXT: Normal ROM in all joints; non-tender to palpation; no edema;  normal capillary refill; no cyanosis, no calf tenderness or swelling    SKIN: Normal color for age and race; warm; no rash NEURO: Moves all extremities equally, sensation to light touch intact diffusely, cranial nerves II through XII intact, normal speech PSYCH: The patient's mood and manner are appropriate. Grooming and personal hygiene are appropriate.  MEDICAL DECISION MAKING: Patient here with complaints of right lower back pain. Pain not reproducible with palpation. No neurologic deficits. Diagnosed with urinary tract infection, palate nephritis at urgent care today. Given IM ceftriaxone and then discharged on Cipro which she has been unable to tolerate at home. Continues to vomit. Abdominal exam benign. Concern for kidney stone versus pyelonephritis. Will obtain CT imaging for further evaluation. Labs show leukocytosis of 19,000 with left shift. Normal lactate. Creatinine mildly elevated at 1.17. Will give IV fluids, morphine, Toradol, Zofran and reassess.  ED PROGRESS: Patient's pain has  been well controlled with Toradol and morphine. No longer having nausea or vomiting. Labs show leukocytosis of 18,000 with left shift but she has no fever or chills and denies any fevers or chills today. Urine shows large hemoglobin but no other sign of infection. Rare bacteria. We have sent a urine culture. I suspect her leukocytosis may be reactive. I do not think that her urine is infected. Her lactate is normal. Her CT scan shows mild right sided hydronephrosis with obstructing 8 x 4 mm stone in the proximal ureter. She has been given a urine strainer and prescription for Zofran. I do not feel this time she needs to continue her Cipro. We will discharge with Percocet, ibuprofen and Flomax. I have discussed with her that she will need to follow-up with urology as an outpatient as she may need a procedure to help remove the stone given its size. I do not feel that urology needs to be counseled emergently. Patient is  completely pain-free at this time, afebrile, nontoxic, tolerating oral liquids. Discussed with her at length return precautions and she is comfortable with this plan.   At this time, I do not feel there is any life-threatening condition present. I have reviewed and discussed all results (EKG, imaging, lab, urine as appropriate) and exam findings with patient/family. I have reviewed nursing notes and appropriate previous records.  I feel the patient is safe to be discharged home without further emergent workup and can continue workup as an outpatient as needed. Discussed usual and customary return precautions. Patient/family verbalize understanding and are comfortable with this plan.  Outpatient follow-up has been provided. All questions have been answered.     I personally performed the services described in this documentation, which was scribed in my presence. The recorded information has been reviewed and is accurate.    [place], DO 10/17/16 [phone]

## 2016-10-16 NOTE — Progress Notes (Signed)
MRN: [id number] DOB: 09-04-1956  Subjective:   [name] is a 60 y.o. female presenting for chief complaint of Emesis (X today); Nausea; Back Pain (lower right); and urine dark  Reports sudden onset today of right flank pain that radiates to her lower right abdomen. Pain is sharp, occurring in waves. Has had nausea with vomiting, cloudy and dark urine, chills. She has not tolerated food well today, has tried water and crackers. Denies fever, dysuria, hematuria, urinary frequency, diarrhea, bloody stools. Denies history of renal stones.  [name] has a current medication list which includes the following prescription(s): alprazolam, aspirin ec, biotin, calcium citrate-vitamin d, duloxetine, estradiol, ezetimibe, glucosamine sulfate, meclizine, omega-3 fatty acids, progesterone, and simvastatin. Also is allergic to codeine phosphate; darvon [propoxyphene]; oxycodone-acetaminophen; propoxyphene n-acetaminophen; and sulfamethoxazole.  [name]  has a past medical history of Acid reflux; Cancer ([place]); Depression; and Hyperlipidemia. Also  has a past surgical history that includes Dilation and curettage of uterus; Appendectomy; Tonsillectomy; Breast enhancement surgery; maxillofacial surgery; and Upper gastrointestinal endoscopy (2002).   Objective:   Vitals: BP 128/76 (BP Location: Right Arm, Patient Position: Sitting, Cuff Size: Normal)   Pulse 85   Temp 98 F (36.7 C) (Oral)   Resp 16   SpO2 100%   Physical Exam  Constitutional: She is oriented to person, place, and time. She appears well-developed and well-nourished.  HENT:  Dry mucous membranes.  Eyes: No scleral icterus.  Cardiovascular: Normal rate, regular rhythm and intact distal pulses.  Exam reveals no gallop and no friction rub.   No murmur heard. Pulmonary/Chest: No respiratory distress. She has no wheezes. She has no rales.  Abdominal: Soft. Bowel sounds are normal. She exhibits no distension and no mass. There is no  tenderness. There is no guarding.  Right-sided CVA tenderness.  Neurological: She is alert and oriented to person, place, and time.  Skin: Skin is warm and dry.   Results for orders placed or performed in visit on 10/16/16 (from the past 24 hour(s))  POCT urinalysis dipstick     Status: Abnormal   Collection Time: 10/16/16  6:31 PM  Result Value Ref Range   Color, UA yellow yellow   Clarity, UA cloudy (A) clear   Glucose, UA negative negative   Bilirubin, UA negative negative   Ketones, POC UA trace (5) (A) negative   Spec Grav, UA 1.020    Blood, UA large (A) negative   pH, UA 7.0    Protein Ur, POC =100 (A) negative   Urobilinogen, UA 0.2    Nitrite, UA Negative Negative   Leukocytes, UA Negative Negative  POCT Microscopic Urinalysis ([facility])     Status: Abnormal   Collection Time: 10/16/16  6:32 PM  Result Value Ref Range   WBC,UR,HPF,POC None None WBC/hpf   RBC,UR,HPF,POC Too numerous to count  (A) None RBC/hpf   Bacteria Moderate (A) None, Too numerous to count   Mucus Absent Absent   Epithelial Cells, UR Per Microscopy Moderate (A) None, Too numerous to count cells/hpf  POCT CBC     Status: Abnormal   Collection Time: 10/16/16  6:32 PM  Result Value Ref Range   WBC 15.7 (A) 4.6 - 10.2 K/uL   Lymph, poc 2.1 0.6 - 3.4   POC LYMPH PERCENT 13.1 10 - 50 %L   MID (cbc) 0.6 0 - 0.9   POC MID % 4.1 0 - 12 %M   POC Granulocyte 13.0 (A) 2 - 6.9   Granulocyte percent 82.8 (  A) 37 - 80 %G   RBC 4.68 4.04 - 5.48 M/uL   Hemoglobin 14.7 12.2 - 16.2 g/dL   HCT, POC 40.9 37.7 - 47.9 %   MCV 87.5 80 - 97 fL   MCH, POC 31.4 (A) 27 - 31.2 pg   MCHC 35.8 (A) 31.8 - 35.4 g/dL   RDW, POC 12.9 %   Platelet Count, POC 255 142 - 424 K/uL   MPV 9.0 0 - 99.8 fL   Assessment and Plan :   1. Acute pyelonephritis 2. Nausea and vomiting, intractability of vomiting not specified, unspecified vomiting type 3. Right flank pain 4. RLQ abdominal pain 5. Cloudy urine - IM ceftriaxone and PO  cipro in clinic for treatment of acute pyelonephritis. Urine culture is pending, start cipro 10 days. Counseled on worsening symptoms, patient is to rtc if this develops.  Jaynee Eagles, PA-C [facility] [phone] 10/16/2016 6:02 PM

## 2016-10-17 ENCOUNTER — Ambulatory Visit (HOSPITAL_COMMUNITY): Payer: BLUE CROSS/BLUE SHIELD | Admitting: Certified Registered Nurse Anesthetist

## 2016-10-17 ENCOUNTER — Emergency Department (HOSPITAL_COMMUNITY): Payer: BLUE CROSS/BLUE SHIELD

## 2016-10-17 ENCOUNTER — Encounter (HOSPITAL_COMMUNITY)
Admission: RE | Disposition: A | Discharge status: Home / Self Care | Payer: Self-pay | Source: Ambulatory Visit | Attending: Urology

## 2016-10-17 ENCOUNTER — Other Ambulatory Visit: Payer: Self-pay | Admitting: Urology

## 2016-10-17 ENCOUNTER — Ambulatory Visit (HOSPITAL_COMMUNITY)
Admission: RE | Admit: 2016-10-17 | Discharge: 2016-10-17 | Disposition: A | Discharge status: Home / Self Care | Payer: BLUE CROSS/BLUE SHIELD | Source: Ambulatory Visit | Attending: Urology | Admitting: Urology

## 2016-10-17 ENCOUNTER — Encounter (HOSPITAL_COMMUNITY): Payer: Self-pay | Admitting: *Deleted

## 2016-10-17 DIAGNOSIS — N132 Hydronephrosis with renal and ureteral calculous obstruction: Secondary | ICD-10-CM | POA: Insufficient documentation

## 2016-10-17 DIAGNOSIS — F419 Anxiety disorder, unspecified: Secondary | ICD-10-CM | POA: Insufficient documentation

## 2016-10-17 DIAGNOSIS — Z79899 Other long term (current) drug therapy: Secondary | ICD-10-CM | POA: Insufficient documentation

## 2016-10-17 DIAGNOSIS — K219 Gastro-esophageal reflux disease without esophagitis: Secondary | ICD-10-CM | POA: Insufficient documentation

## 2016-10-17 DIAGNOSIS — F329 Major depressive disorder, single episode, unspecified: Secondary | ICD-10-CM | POA: Insufficient documentation

## 2016-10-17 DIAGNOSIS — N200 Calculus of kidney: Secondary | ICD-10-CM

## 2016-10-17 DIAGNOSIS — Z87891 Personal history of nicotine dependence: Secondary | ICD-10-CM | POA: Insufficient documentation

## 2016-10-17 DIAGNOSIS — E785 Hyperlipidemia, unspecified: Secondary | ICD-10-CM | POA: Diagnosis not present

## 2016-10-17 DIAGNOSIS — N201 Calculus of ureter: Secondary | ICD-10-CM | POA: Diagnosis not present

## 2016-10-17 DIAGNOSIS — R002 Palpitations: Secondary | ICD-10-CM | POA: Diagnosis not present

## 2016-10-17 HISTORY — PX: CYSTOSCOPY WITH RETROGRADE PYELOGRAM, URETEROSCOPY AND STENT PLACEMENT: SHX5789

## 2016-10-17 LAB — COMPLETE METABOLIC PANEL WITH GFR
ALT: 11 U/L (ref 6–29)
AST: 19 U/L (ref 10–35)
Albumin: 4.7 g/dL (ref 3.6–5.1)
Alkaline Phosphatase: 58 U/L (ref 33–130)
BUN: 16 mg/dL (ref 7–25)
CO2: 24 mmol/L (ref 20–31)
Calcium: 10 mg/dL (ref 8.6–10.4)
Chloride: 105 mmol/L (ref 98–110)
Creat: 1.04 mg/dL — ABNORMAL HIGH (ref 0.50–0.99)
GFR, Est African American: 67 mL/min (ref >=60)
GFR, Est Non African American: 59 mL/min — ABNORMAL LOW (ref >=60)
Glucose, Bld: 127 mg/dL — ABNORMAL HIGH (ref 65–99)
Potassium: 4.2 mmol/L (ref 3.5–5.3)
Sodium: 141 mmol/L (ref 135–146)
Total Bilirubin: 1.7 mg/dL — ABNORMAL HIGH (ref 0.2–1.2)
Total Protein: 7.1 g/dL (ref 6.1–8.1)

## 2016-10-17 LAB — URINALYSIS, ROUTINE W REFLEX MICROSCOPIC
BILIRUBIN URINE: NEGATIVE
GLUCOSE, UA: NEGATIVE mg/dL
KETONES UR: 15 mg/dL — AB
Leukocytes, UA: NEGATIVE
Nitrite: NEGATIVE
PH: 8 (ref 5.0–8.0)
PROTEIN: NEGATIVE mg/dL
Specific Gravity, Urine: 1.014 (ref 1.005–1.030)

## 2016-10-17 LAB — URINE MICROSCOPIC-ADD ON: WBC, UA: NONE SEEN WBC/hpf (ref 0–5)

## 2016-10-17 SURGERY — CYSTOURETEROSCOPY, WITH RETROGRADE PYELOGRAM AND STENT INSERTION
Anesthesia: General | Site: Ureter | Laterality: Right

## 2016-10-17 MED ORDER — SODIUM CHLORIDE 0.9 % IV SOLN
INTRAVENOUS | Status: DC | PRN
  Administered 2016-10-17: 17:00:00
  Administered 2016-10-17: 10 mL

## 2016-10-17 MED ORDER — FENTANYL CITRATE (PF) 100 MCG/2ML IJ SOLN: 25.0000 ug | INTRAMUSCULAR | Status: DC | PRN

## 2016-10-17 MED ORDER — IBUPROFEN 800 MG PO TABS: 800.0000 mg | ORAL_TABLET | Freq: Three times a day (TID) | ORAL | 0 refills | Status: DC | PRN

## 2016-10-17 MED ORDER — PROPOFOL 10 MG/ML IV BOLUS
INTRAVENOUS | Status: DC | PRN
  Administered 2016-10-17: 150 mg via INTRAVENOUS

## 2016-10-17 MED ORDER — LIDOCAINE HCL 2 % EX GEL
CUTANEOUS | Status: AC
  Filled 2016-10-17: qty 5

## 2016-10-17 MED ORDER — PHENAZOPYRIDINE HCL 200 MG PO TABS: 200.0000 mg | ORAL_TABLET | Freq: Three times a day (TID) | ORAL | 0 refills | Status: DC | PRN

## 2016-10-17 MED ORDER — DEXAMETHASONE SODIUM PHOSPHATE 10 MG/ML IJ SOLN
INTRAMUSCULAR | Status: DC | PRN
  Administered 2016-10-17: 10 mg via INTRAVENOUS

## 2016-10-17 MED ORDER — SCOPOLAMINE 1 MG/3DAYS TD PT72
MEDICATED_PATCH | TRANSDERMAL | Status: AC
  Filled 2016-10-17: qty 1

## 2016-10-17 MED ORDER — CIPROFLOXACIN IN D5W 400 MG/200ML IV SOLN
400.0000 mg | INTRAVENOUS | Status: AC
  Administered 2016-10-17: 400 mg via INTRAVENOUS

## 2016-10-17 MED ORDER — TAMSULOSIN HCL 0.4 MG PO CAPS: 0.4000 mg | ORAL_CAPSULE | Freq: Every day | ORAL | 0 refills | Status: DC

## 2016-10-17 MED ORDER — BELLADONNA ALKALOIDS-OPIUM 16.2-60 MG RE SUPP
RECTAL | Status: DC | PRN
  Administered 2016-10-17: 1 via RECTAL

## 2016-10-17 MED ORDER — ONDANSETRON HCL 4 MG/2ML IJ SOLN
INTRAMUSCULAR | Status: AC
  Filled 2016-10-17: qty 2

## 2016-10-17 MED ORDER — MIDAZOLAM HCL 5 MG/5ML IJ SOLN
INTRAMUSCULAR | Status: DC | PRN
  Administered 2016-10-17 (×2): 1 mg via INTRAVENOUS

## 2016-10-17 MED ORDER — FENTANYL CITRATE (PF) 100 MCG/2ML IJ SOLN
INTRAMUSCULAR | Status: AC
  Filled 2016-10-17: qty 2

## 2016-10-17 MED ORDER — SUCCINYLCHOLINE CHLORIDE 20 MG/ML IJ SOLN
INTRAMUSCULAR | Status: DC | PRN
  Administered 2016-10-17: 100 mg via INTRAVENOUS

## 2016-10-17 MED ORDER — SCOPOLAMINE 1 MG/3DAYS TD PT72
MEDICATED_PATCH | TRANSDERMAL | Status: DC | PRN
  Administered 2016-10-17: 1 via TRANSDERMAL

## 2016-10-17 MED ORDER — CIPROFLOXACIN IN D5W 400 MG/200ML IV SOLN
INTRAVENOUS | Status: AC
  Filled 2016-10-17: qty 200

## 2016-10-17 MED ORDER — LIDOCAINE HCL 2 % EX GEL
CUTANEOUS | Status: DC | PRN
  Administered 2016-10-17: 1 via URETHRAL

## 2016-10-17 MED ORDER — MIDAZOLAM HCL 2 MG/2ML IJ SOLN
INTRAMUSCULAR | Status: AC
  Filled 2016-10-17: qty 2

## 2016-10-17 MED ORDER — SUCCINYLCHOLINE CHLORIDE 20 MG/ML IJ SOLN
INTRAMUSCULAR | Status: AC
  Filled 2016-10-17: qty 1

## 2016-10-17 MED ORDER — OXYCODONE-ACETAMINOPHEN 5-325 MG PO TABS: 1.0000 | ORAL_TABLET | Freq: Four times a day (QID) | ORAL | 0 refills | Status: DC | PRN

## 2016-10-17 MED ORDER — ONDANSETRON HCL 4 MG/2ML IJ SOLN
INTRAMUSCULAR | Status: DC | PRN
  Administered 2016-10-17: 4 mg via INTRAVENOUS

## 2016-10-17 MED ORDER — FENTANYL CITRATE (PF) 100 MCG/2ML IJ SOLN
INTRAMUSCULAR | Status: DC | PRN
  Administered 2016-10-17 (×2): 50 ug via INTRAVENOUS

## 2016-10-17 MED ORDER — BELLADONNA ALKALOIDS-OPIUM 16.2-60 MG RE SUPP
RECTAL | Status: AC
  Filled 2016-10-17: qty 1

## 2016-10-17 MED ORDER — DEXAMETHASONE SODIUM PHOSPHATE 10 MG/ML IJ SOLN
INTRAMUSCULAR | Status: AC
  Filled 2016-10-17: qty 1

## 2016-10-17 MED ORDER — SODIUM CHLORIDE 0.9 % IR SOLN
Status: DC | PRN
  Administered 2016-10-17: 3000 mL

## 2016-10-17 MED ORDER — EPHEDRINE SULFATE 50 MG/ML IJ SOLN
INTRAMUSCULAR | Status: DC | PRN
  Administered 2016-10-17 (×2): 5 mg via INTRAVENOUS

## 2016-10-17 MED ORDER — TRAMADOL HCL 50 MG PO TABS: 50.0000 mg | ORAL_TABLET | Freq: Four times a day (QID) | ORAL | 0 refills | Status: DC | PRN

## 2016-10-17 MED ORDER — LIDOCAINE 2% (20 MG/ML) 5 ML SYRINGE
INTRAMUSCULAR | Status: AC
  Filled 2016-10-17: qty 5

## 2016-10-17 MED ORDER — SODIUM CHLORIDE 0.9 % IR SOLN
Status: DC | PRN
  Administered 2016-10-17: 1000 mL

## 2016-10-17 MED ORDER — PROPOFOL 10 MG/ML IV BOLUS
INTRAVENOUS | Status: AC
  Filled 2016-10-17: qty 20

## 2016-10-17 MED ORDER — LACTATED RINGERS IV SOLN
INTRAVENOUS | Status: DC
  Administered 2016-10-17 (×2): via INTRAVENOUS

## 2016-10-17 MED ORDER — LIDOCAINE HCL (CARDIAC) 20 MG/ML IV SOLN
INTRAVENOUS | Status: DC | PRN
  Administered 2016-10-17: 100 mg via INTRAVENOUS

## 2016-10-17 MED ORDER — PROMETHAZINE HCL 25 MG/ML IJ SOLN: 6.2500 mg | INTRAMUSCULAR | Status: DC | PRN

## 2016-10-17 SURGICAL SUPPLY — 22 items
BAG URO CATCHER STRL LF (MISCELLANEOUS) ×3 IMPLANT
BASKET DAKOTA 1.9FR 11X120 (BASKET) IMPLANT
BASKET ZERO TIP NITINOL 2.4FR (BASKET) IMPLANT
CATH DILATION NEPHR BALL 15X10 (CATHETERS) ×3 IMPLANT
CATH URET 5FR 28IN OPEN ENDED (CATHETERS) ×3 IMPLANT
CATH URET DUAL LUMEN 6-10FR 50 (CATHETERS) ×3 IMPLANT
CLOTH BEACON ORANGE TIMEOUT ST (SAFETY) ×3 IMPLANT
EXTRACTOR STONE NITINOL NGAGE (UROLOGICAL SUPPLIES) ×3 IMPLANT
FIBER LASER FLEXIVA 365 (UROLOGICAL SUPPLIES) ×3 IMPLANT
GLOVE BIOGEL M STRL SZ7.5 (GLOVE) ×3 IMPLANT
GOWN STRL REUS W/TWL XL LVL3 (GOWN DISPOSABLE) ×3 IMPLANT
GUIDEWIRE ANG ZIPWIRE 038X150 (WIRE) IMPLANT
GUIDEWIRE STR DUAL SENSOR (WIRE) ×3 IMPLANT
MANIFOLD NEPTUNE II (INSTRUMENTS) ×3 IMPLANT
PACK CYSTO (CUSTOM PROCEDURE TRAY) ×3 IMPLANT
SHEATH ACCESS URETERAL 24CM (SHEATH) IMPLANT
SHEATH ACCESS URETERAL 38CM (SHEATH) ×3 IMPLANT
SHEATH ACCESS URETERAL 54CM (SHEATH) IMPLANT
STENT URET 6FRX24 CONTOUR (STENTS) ×3 IMPLANT
SYRINGE 20CC LL (MISCELLANEOUS) ×3 IMPLANT
TUBING CONNECTING 10 (TUBING) ×2 IMPLANT
TUBING CONNECTING 10' (TUBING) ×1

## 2016-10-17 NOTE — Op Note (Signed)
Preoperative diagnosis:  1. Obstructing right ureteral stone   Postoperative diagnosis:  1. Same   Procedure: 1. Cystoscopy, right retrograde pyelogram with interpretation 2. Right ureteral dilation 3. Right ureteroscopy, laser lithotripsy, stone extraction 4. Right ureteral stent placement  Surgeon: Ardis Hughs, MD  Anesthesia: General  Complications: None  Intraoperative findings:  #1: On the right retrograde pyelogram, the patient was noted to have a filling defect in the proximal right ureter consistent with the patient's stone and associated proximal hydronephrosis. #2: The patient's ureter was very narrow and difficult to navigate with the semirigid ureteroscope (4/6). The stone was noted to be pushed up into the kidney. In order to get up to the kidney I had to balloon dilate the ureter in several places using a 10 cm times 15 French ureteral balloon dilator. #3: The stone was placed in the upper pole and dusted into numerous smaller fragments. A large fragment was then removed and will be sent for stone analysis. #4: A 24 cm times 6 French double-J ureteral stent was placed. The stent tether was removed.  EBL: Minimal  Specimens: A piece of the stone was removed and taken to [facility] for stone composition analysis  Indication: [name] is a 60 y.o. patient with an obstructing right ureteral stone with associated pain that was poorly controlled.  After reviewing the management options for treatment, he elected to proceed with the above surgical procedure(s). We have discussed the potential benefits and risks of the procedure, side effects of the proposed treatment, the likelihood of the patient achieving the goals of the procedure, and any potential problems that might occur during the procedure or recuperation. Informed consent has been obtained.  Description of procedure:  The patient was taken to the operating room and general anesthesia was induced.   The patient was placed in the dorsal lithotomy position, prepped and draped in the usual sterile fashion, and preoperative antibiotics were administered. A preoperative time-out was performed.   I was unable to pass the 21 French cystoscope initially and thus needed to dilate the patient's urethra gently. Once the urethra was accommodating to the scope I gently passed and then under visual guidance. Using the 30 lens cystoscopy was performed with no significant findings. The ureters were orthotopic. I then used a 6 [place] open ended ureteral catheter and performed a retrograde pyelogram using 10 mL of Omnipaque contrast with the above findings.  I then advanced a 0.038 sensor wire up into the patient's right ureter and into the renal pelvis. The cystoscope was then removed. I then passed a 4/6 French semirigid ureteroscope through the patient's urethra and cannulated the right ureter. I was unable to advance the scope initially and used a second wire to help facilitate the navigation up to the right renal pelvis. A PET the UPJ, there was a mucosal tear because of the dilation from the scope. I passed a second wire up into the right renal pelvis and removed the ureteroscope over the wire. I then attempted to advance the single-lumen flexible ureteroscope over the second wire unsuccessfully.  I then removed the flexible scope over the wire leaving the wire in place and passed a 10 cm times 15 [place] UroMax balloon dilator over the second wire and into the distal ureter. I inflated the balloon to 14 mL H2O and then after 90 seconds. The balloon down it and passed it further into the proximal ureter and inflated it again. Once the entire ureter had been completely  dilated eye was able to remove the dilator and exchanged for the flexible ureteroscope. Once into the renal pelvis on the right eye navigated the calyces and encountered the stone in the upper pole lower infundibulum. I used the basket and pass it into  the upper pole upper infundibulum and using a 200  fiber fragmented the stone into numerous pieces. Most of the stone was dusted, there was one small large fragment that I was able to remove using the in gauge stone basket.  Backing out the flexible ureteroscope under visual guidance the proximal ureter was noted to be split with periureteral fat notable. The remaining aspect of the ureter was intact. As such, I opted to remove the stent tether and placed a 24 cm times 6 French double-J over the safety wire and into the upper pole calyx of the right kidney under fluoroscopic guidance. Once the stent was noted to be well into the kidney advanced it to the urethral meatus and removed the wire leaving the stent behind and curled in the bladder. The bladder was then drained.  Urethral lidocaine jelly was then placed in the patient's urethra and a B&O suppository placed in the patient's rectum. She was subsequently extubated and returned to the PACU in stable condition.  Disposition: The patient will be scheduled for follow-up in 7-10 days for removal of her ureteral stent.  Ardis Hughs, M.D.

## 2016-10-17 NOTE — ED Notes (Signed)
Patient transported to CT 

## 2016-10-17 NOTE — Discharge Instructions (Signed)
Please use your urine strainer. You may take ibuprofen, Percocet as needed for pain. May take Zofran as needed for nausea and vomiting. I do not feel at this time you need to continue her Cipro. I do not think this is a urinary tract infection or kidney infection. You will need to follow-up with the urologist as an outpatient. Your kidney stone is 8 x 4 mm. There was mild swelling of your kidney. Please increase your water intake.

## 2016-10-17 NOTE — H&P (Signed)
History of present illness: 60 year old female who presented to the emergency department yesterday afternoon with progressively worsening right-sided flank pain with associated nausea and vomiting. The patient stated that the pain started 12 hours prior to her presentation. She denies any fevers or chills. She describes her pain as a intense knifelike pain in her right flank radiating around to her right labia.  In the emergency department the patient was diagnosed with an 8 mm proximal ureteral stone. She was treated with conservative measures including medication and sent to the urology clinic. In clinic today, the patient's symptoms were mildly improved but she continued to have symptoms.   She had no progressive voiding symptoms dysuria, hematuria, or fever/chills.  Review of systems: A 12 point comprehensive review of systems was obtained and is negative unless otherwise stated in the history of present illness.  Patient Active Problem List   Diagnosis Date Noted  . Trigger finger, acquired 10/13/2014  . Palpitations 10/21/2013  . At risk for coronary artery disease 10/21/2013  . Anxiety   . Hyperlipidemia 06/25/2009    No current facility-administered medications on file prior to encounter.    Current Outpatient Prescriptions on File Prior to Encounter  Medication Sig Dispense Refill  . ALPRAZolam (XANAX) 0.25 MG tablet Take 1 tablet (0.25 mg total) by mouth 3 (three) times daily as needed for sleep or anxiety. 60 tablet 0  . Biotin (BIOTIN 5000) 5 MG CAPS Take 1 capsule by mouth daily.      . Calcium Citrate-Vitamin D (CITRACAL/VITAMIN D) 250-200 MG-UNIT TABS Take 2 tablets by mouth daily.      . ciprofloxacin (CIPRO) 500 MG tablet Take 1 tablet (500 mg total) by mouth 2 (two) times daily. 20 tablet 0  . estradiol (VIVELLE-DOT) 0.05 MG/24HR patch Place 1 patch onto the skin 2 (two) times a week.     . ezetimibe (ZETIA) 10 MG tablet Take 1 tablet (10 mg total) by mouth daily. 30  tablet 2  . Glucosamine Sulfate 1000 MG CAPS Take 1 capsule by mouth daily.     Marland Kitchen ibuprofen (ADVIL,MOTRIN) 800 MG tablet Take 1 tablet (800 mg total) by mouth every 8 (eight) hours as needed for mild pain. 30 tablet 0  . Omega-3 Fatty Acids (FISH OIL PO) Take 2,000 mg by mouth 2 (two) times daily.    . ondansetron (ZOFRAN-ODT) 8 MG disintegrating tablet Take 1 tablet (8 mg total) by mouth every 8 (eight) hours as needed for nausea. 15 tablet 0  . progesterone (PROMETRIUM) 100 MG capsule Take 100 mg by mouth daily.      . simvastatin (ZOCOR) 20 MG tablet Take 1 tablet (20 mg total) by mouth daily at 6 PM. 30 tablet 2  . tamsulosin (FLOMAX) 0.4 MG CAPS capsule Take 1 capsule (0.4 mg total) by mouth daily. 30 capsule 0  . DULoxetine (CYMBALTA) 20 MG capsule Take 1 capsule (20 mg total) by mouth daily. (Patient taking differently: Take 20 mg by mouth every other day. ) 90 capsule 3  . fluconazole (DIFLUCAN) 150 MG tablet Take 1 tablet (150 mg total) by mouth once a week. Repeat if needed (Patient not taking: Reported on 10/17/2016) 2 tablet 0  . meclizine (ANTIVERT) 12.5 MG tablet Take 1 tablet (12.5 mg total) by mouth 3 (three) times daily as needed for dizziness. 30 tablet 0  . oxyCODONE-acetaminophen (PERCOCET/ROXICET) 5-325 MG tablet Take 1-2 tablets by mouth every 6 (six) hours as needed. 20 tablet 0    Past Medical  History:  Diagnosis Date  . Acid reflux   . Cancer ([place])    precancerous cervix area  . Depression   . Hyperlipidemia     Past Surgical History:  Procedure Laterality Date  . APPENDECTOMY    . BREAST ENHANCEMENT SURGERY    . DILATION AND CURETTAGE OF UTERUS    . maxillofacial surgery     for TMJ  . TONSILLECTOMY    . UPPER GASTROINTESTINAL ENDOSCOPY  2002   Dr Michail Sermon    Social History  Substance Use Topics  . Smoking status: Former Smoker    Packs/day: 0.30    Years: 4.00    Quit date: 12/04/1992  . Smokeless tobacco: Never Used  . Alcohol use No     Comment:  quit 2.5 yrs ago    Family History  Problem Relation Age of Onset  . Stroke Father 22    CVA multiple  . Heart disease Father   . Emphysema Mother   . Cancer Sister     BREAST CANCER  . Alzheimer's disease Brother   . Diabetes Brother   . Stroke Brother   . Cancer Sister     BREAST CANCER  . Breast cancer Maternal Aunt   . Colon cancer Neg Hx     PE: Vitals:   10/17/16 1422  BP: 136/62  Pulse: 62  Resp: 20  Temp: 98.1 F (36.7 C)  TempSrc: Oral  SpO2: 100%   Patient appears to be in no acute distress  patient is alert and oriented x3 Atraumatic normocephalic head No cervical or supraclavicular lymphadenopathy appreciated No increased work of breathing, no audible wheezes/rhonchi Regular sinus rhythm/rate Abdomen is soft, nontender, nondistended Right CVA tenderness Lower extremities are symmetric without appreciable edema Grossly neurologically intact No identifiable skin lesions   Recent Labs  10/16/16 1832 10/16/16 2056  WBC 15.7* 18.8*  HGB 14.7 14.5  HCT 40.9 42.0    Recent Labs  10/16/16 2056  NA 140  K 3.6  CL 106  CO2 24  GLUCOSE 133*  BUN 15  CREATININE 1.17*  CALCIUM 9.8   No results for input(s): LABPT, INR in the last 72 hours. No results for input(s): LABURIN in the last 72 hours. No results found for this or any previous visit.  Imaging: I've independently reviewed the patient's CT scan demonstrating a stone in the right proximal ureter with associated proximal hydronephrosis.  Imp: The patient has a large right proximal ureteral stone with severe hydronephrosis and poorly controlled pain.  Recommendations: We discussed treatment options including medical expulsion therapy, shockwave lithotripsy, and ureteroscopy. After going over the risks and benefits of each option, the patient has opted to proceed with cystoscopy, right ureteroscopy, laser lithotripsy, stent placement. Patient understands that following the procedure she was  stent discomfort. Further, she understands there is a small chance of me napping out to remove the stones and only leaving a stent requiring a staged procedure.  Louis Meckel W

## 2016-10-17 NOTE — Anesthesia Preprocedure Evaluation (Addendum)
Anesthesia Evaluation  Patient identified by MRN, date of birth, ID band Patient awake    Reviewed: Allergy & Precautions, NPO status , Patient's Chart, lab work & pertinent test results  Airway Mallampati: I  TM Distance: >3 FB Neck ROM: Full    Dental  (+) Teeth Intact, Dental Advisory Given   Pulmonary former smoker,    Pulmonary exam normal breath sounds clear to auscultation       Cardiovascular Exercise Tolerance: Good negative cardio ROS Normal cardiovascular exam Rhythm:Regular Rate:Normal     Neuro/Psych PSYCHIATRIC DISORDERS Anxiety Depression negative neurological ROS     GI/Hepatic Neg liver ROS, GERD  Medicated,  Endo/Other  negative endocrine ROS  Renal/GU negative Renal ROS     Musculoskeletal negative musculoskeletal ROS (+)   Abdominal   Peds  Hematology negative hematology ROS (+)   Anesthesia Other Findings Day of surgery medications reviewed with the patient.  Cervical cancer  Reproductive/Obstetrics                             Anesthesia Physical Anesthesia Plan  ASA: II  Anesthesia Plan: General   Post-op Pain Management:    Induction: Intravenous  Airway Management Planned: Oral ETT  Additional Equipment:   Intra-op Plan:   Post-operative Plan: Extubation in OR  Informed Consent: I have reviewed the patients History and Physical, chart, labs and discussed the procedure including the risks, benefits and alternatives for the proposed anesthesia with the patient or authorized representative who has indicated his/her understanding and acceptance.   Dental advisory given  Plan Discussed with: CRNA  Anesthesia Plan Comments: (Risks/benefits of general anesthesia discussed with patient including risk of damage to teeth, lips, gum, and tongue, nausea/vomiting, allergic reactions to medications, and the possibility of heart attack, stroke and death.  All  patient questions answered.  Patient wishes to proceed.)       Anesthesia Quick Evaluation

## 2016-10-17 NOTE — Anesthesia Procedure Notes (Signed)
Procedure Name: Intubation Date/Time: 10/17/2016 4:27 PM Performed by: Maxwell Caul Pre-anesthesia Checklist: Patient identified, Emergency Drugs available, Suction available and Patient being monitored Patient Re-evaluated:Patient Re-evaluated prior to inductionOxygen Delivery Method: Circle system utilized Preoxygenation: Pre-oxygenation with 100% oxygen Intubation Type: IV induction, Rapid sequence and Cricoid Pressure applied Laryngoscope Size: Mac and 4 Grade View: Grade II Tube type: Oral Tube size: 7.0 mm Number of attempts: 1 Airway Equipment and Method: Stylet and Oral airway Placement Confirmation: ETT inserted through vocal cords under direct vision,  positive ETCO2 and breath sounds checked- equal and bilateral Secured at: 21 cm Tube secured with: Tape Dental Injury: Teeth and Oropharynx as per pre-operative assessment

## 2016-10-17 NOTE — Anesthesia Postprocedure Evaluation (Signed)
Anesthesia Post Note  Patient: JANETTE MUSZYNSKI  Procedure(s) Performed: Procedure(s) (LRB): CYSTOSCOPY WITH RETROGRADE PYELOGRAM, URETEROSCOPY LASER LITHOTRIPSY AND STENT PLACEMENT (Right)  Patient location during evaluation: PACU Anesthesia Type: General Level of consciousness: awake and alert Pain management: pain level controlled Vital Signs Assessment: post-procedure vital signs reviewed and stable Respiratory status: spontaneous breathing, nonlabored ventilation, respiratory function stable and patient connected to nasal cannula oxygen Cardiovascular status: blood pressure returned to baseline and stable Postop Assessment: no signs of nausea or vomiting Anesthetic complications: no    Last Vitals:  Vitals:   10/17/16 1845 10/17/16 1925  BP: 127/71 120/67  Pulse: 81 72  Resp: 15 17  Temp: 36.9 C 36.7 C    Last Pain:  Vitals:   10/17/16 1800  TempSrc:   PainSc: 0-No pain                 Catalina Gravel

## 2016-10-17 NOTE — Discharge Instructions (Signed)
DISCHARGE INSTRUCTIONS FOR KIDNEY STONE/URETERAL STENT   MEDICATIONS:  1.  Resume all your other meds from home - except do not take any extra narcotic pain meds that you may have at home.  2. Pyridium is to help with the burning/stinging when you urinate. 3. Tramadol is for moderate/severe pain, otherwise taking upto 1000 mg every 6 hours of plainTylenol will help treat your pain.    ACTIVITY:  1. No strenuous activity x 1week  2. No driving while on narcotic pain medications  3. Drink plenty of water  4. Continue to walk at home - you can still get blood clots when you are at home, so keep active, but don't over do it.  5. May return to work/school tomorrow or when you feel ready    Post Anesthesia Home Care Instructions  Activity: Get plenty of rest for the remainder of the day. A responsible adult should stay with you for 24 hours following the procedure.  For the next 24 hours, DO NOT: -Drive a car -Paediatric nurse -Drink alcoholic beverages -Take any medication unless instructed by your physician ds such as gelatin or soup. Progress to regular foods as tolerated. Avoid greasy, spicy, heavy foods. If nausea and/or vomiting occur, drink only clear liquids until the nausea and/or vomiting subsides. Call your physician if vomiting continues.  Special Instructions/Symptoms: Your throat may feel dry or sore from the anesthesia or the breathing tube placed in your throat during surgery. If this causes discomfort, gargle with warm salt water. The discomfort should disappear within 24 hours.  If you had a scopolamine patch placed behind your ear for the management of post- operative nausea and/or vomiting:  1. The medication in the patch is effective for 72 hours, after which it should be removed.  Wrap patch in a tissue and discard in the trash. Wash hands thoroughly with soap and water. 2. You may remove the patch earlier than 72 hours if you experience unpleasant side effects which  may include dry mouth, dizziness or visual disturbances. 3. Avoid touching the patch. Wash your hands with soap and water after contact with the patch.  -Make any legal decisions or sign important papers.  Meals: Start with liquid foo   BATHING:  1. You can shower and we recommend daily showers    SIGNS/SYMPTOMS TO CALL:  Please call us if you have a fever greater than 101.5, uncontrolled nausea/vomiting, uncontrolled pain, dizziness, unable to urinate, bloody urine, chest pain, shortness of breath, leg swelling, leg pain, redness around wound, drainage from wound, or any other concerns or questions.   You can reach [place] at [phone].   FOLLOW-UP:  1. You have an appointment for stent removal in 1-2 week.

## 2016-10-17 NOTE — Transfer of Care (Signed)
Immediate Anesthesia Transfer of Care Note  Patient: [name]  Procedure(s) Performed: Procedure(s): CYSTOSCOPY WITH RETROGRADE PYELOGRAM, URETEROSCOPY LASER LITHOTRIPSY AND STENT PLACEMENT (Right)  Patient Location: PACU  Anesthesia Type:General  Level of Consciousness:  sedated, patient cooperative and responds to stimulation  Airway & Oxygen Therapy:Patient Spontanous Breathing and Patient connected to face mask oxgen  Post-op Assessment:  Report given to PACU RN and Post -op Vital signs reviewed and stable  Post vital signs:  Reviewed and stable  Last Vitals:  Vitals:   10/17/16 1422  BP: 136/62  Pulse: 62  Resp: 20  Temp: 123XX123 C    Complications: No apparent anesthesia complications

## 2016-10-18 ENCOUNTER — Encounter (HOSPITAL_COMMUNITY): Payer: Self-pay | Admitting: Urology

## 2016-10-18 LAB — URINE CULTURE: CULTURE: NO GROWTH

## 2016-10-21 DIAGNOSIS — Z803 Family history of malignant neoplasm of breast: Secondary | ICD-10-CM | POA: Diagnosis not present

## 2016-10-21 DIAGNOSIS — Z1231 Encounter for screening mammogram for malignant neoplasm of breast: Secondary | ICD-10-CM | POA: Diagnosis not present

## 2016-10-30 DIAGNOSIS — R1084 Generalized abdominal pain: Secondary | ICD-10-CM | POA: Diagnosis not present

## 2016-10-30 DIAGNOSIS — N201 Calculus of ureter: Secondary | ICD-10-CM | POA: Diagnosis not present

## 2016-10-31 DIAGNOSIS — N201 Calculus of ureter: Secondary | ICD-10-CM | POA: Diagnosis not present

## 2016-11-01 DIAGNOSIS — R1084 Generalized abdominal pain: Secondary | ICD-10-CM | POA: Diagnosis not present

## 2016-11-24 DIAGNOSIS — N1330 Unspecified hydronephrosis: Secondary | ICD-10-CM | POA: Diagnosis not present

## 2016-11-24 DIAGNOSIS — N201 Calculus of ureter: Secondary | ICD-10-CM | POA: Diagnosis not present

## 2016-12-19 DIAGNOSIS — N1330 Unspecified hydronephrosis: Secondary | ICD-10-CM | POA: Diagnosis not present

## 2017-01-02 ENCOUNTER — Encounter: Payer: Self-pay | Admitting: Nurse Practitioner

## 2017-01-02 DIAGNOSIS — E782 Mixed hyperlipidemia: Secondary | ICD-10-CM

## 2017-01-02 MED ORDER — SIMVASTATIN 20 MG PO TABS: 20.0000 mg | ORAL_TABLET | Freq: Every day | ORAL | 2 refills | Status: DC

## 2017-01-02 MED ORDER — EZETIMIBE 10 MG PO TABS
10.0000 mg | ORAL_TABLET | Freq: Every day | ORAL | 2 refills | Status: DC
Start: 2017-01-02 — End: 2017-04-20

## 2017-01-11 DIAGNOSIS — Z01419 Encounter for gynecological examination (general) (routine) without abnormal findings: Secondary | ICD-10-CM | POA: Diagnosis not present

## 2017-01-11 DIAGNOSIS — Z6822 Body mass index (BMI) 22.0-22.9, adult: Secondary | ICD-10-CM | POA: Diagnosis not present

## 2017-01-12 LAB — HM PAP SMEAR

## 2017-02-05 DIAGNOSIS — N1330 Unspecified hydronephrosis: Secondary | ICD-10-CM | POA: Diagnosis not present

## 2017-03-12 DIAGNOSIS — M170 Bilateral primary osteoarthritis of knee: Secondary | ICD-10-CM | POA: Diagnosis not present

## 2017-03-20 ENCOUNTER — Encounter: Payer: Self-pay | Admitting: Nurse Practitioner

## 2017-03-20 DIAGNOSIS — M1710 Unilateral primary osteoarthritis, unspecified knee: Secondary | ICD-10-CM | POA: Insufficient documentation

## 2017-03-20 DIAGNOSIS — M179 Osteoarthritis of knee, unspecified: Secondary | ICD-10-CM | POA: Insufficient documentation

## 2017-04-03 DIAGNOSIS — M1711 Unilateral primary osteoarthritis, right knee: Secondary | ICD-10-CM | POA: Diagnosis not present

## 2017-04-16 ENCOUNTER — Other Ambulatory Visit: Payer: Self-pay | Admitting: Nurse Practitioner

## 2017-04-16 DIAGNOSIS — E782 Mixed hyperlipidemia: Secondary | ICD-10-CM

## 2017-04-20 ENCOUNTER — Other Ambulatory Visit: Payer: Self-pay | Admitting: Nurse Practitioner

## 2017-04-20 DIAGNOSIS — E782 Mixed hyperlipidemia: Secondary | ICD-10-CM

## 2017-05-03 DIAGNOSIS — H1045 Other chronic allergic conjunctivitis: Secondary | ICD-10-CM | POA: Diagnosis not present

## 2017-05-06 DIAGNOSIS — H1033 Unspecified acute conjunctivitis, bilateral: Secondary | ICD-10-CM | POA: Diagnosis not present

## 2017-05-30 ENCOUNTER — Encounter: Payer: Self-pay | Admitting: Nurse Practitioner

## 2017-05-30 DIAGNOSIS — E782 Mixed hyperlipidemia: Secondary | ICD-10-CM

## 2017-05-30 NOTE — Telephone Encounter (Signed)
I received a MyChart appointment request and have her scheduled to come in on 06/08/17.

## 2017-05-30 NOTE — Telephone Encounter (Signed)
Please advise 

## 2017-05-31 MED ORDER — EZETIMIBE 10 MG PO TABS: 10.0000 mg | ORAL_TABLET | Freq: Every day | ORAL | 0 refills | Status: DC

## 2017-06-08 ENCOUNTER — Ambulatory Visit (INDEPENDENT_AMBULATORY_CARE_PROVIDER_SITE_OTHER): Payer: BLUE CROSS/BLUE SHIELD | Admitting: Nurse Practitioner

## 2017-06-08 ENCOUNTER — Encounter: Payer: Self-pay | Admitting: Nurse Practitioner

## 2017-06-08 VITALS — BP 112/66 | HR 56 | Temp 98.4°F | Ht 65.0 in | Wt 129.0 lb

## 2017-06-08 DIAGNOSIS — F419 Anxiety disorder, unspecified: Secondary | ICD-10-CM

## 2017-06-08 DIAGNOSIS — Z803 Family history of malignant neoplasm of breast: Secondary | ICD-10-CM

## 2017-06-08 DIAGNOSIS — E782 Mixed hyperlipidemia: Secondary | ICD-10-CM

## 2017-06-08 MED ORDER — EZETIMIBE 10 MG PO TABS: 10.0000 mg | ORAL_TABLET | Freq: Every day | ORAL | 1 refills | Status: DC

## 2017-06-08 MED ORDER — SIMVASTATIN 20 MG PO TABS: 20.0000 mg | ORAL_TABLET | Freq: Every day | ORAL | 1 refills | Status: DC

## 2017-06-08 NOTE — Patient Instructions (Signed)
Return to basement for blood draw. Need to be fasting at least 6-8hrs prior to blood draw.

## 2017-06-08 NOTE — Progress Notes (Signed)
Subjective:  Patient ID: [name], female    DOB: 1956/09/14  Age: 61 y.o. MRN: [id number]  CC: Follow-up (medication refill (cholesterol med))   Anxiety  Presents for follow-up visit. Symptoms include depressed mood, excessive worry, muscle tension, nervous/anxious behavior, palpitations, panic and restlessness. Patient reports no chest pain, compulsions, confusion, decreased concentration, dizziness, dry mouth, feeling of choking, hyperventilation, impotence, insomnia, irritability, malaise, nausea, obsessions, shortness of breath or suicidal ideas. Symptoms occur occasionally. The severity of symptoms is mild. The quality of sleep is fair. Nighttime awakenings: one to two.   Compliance with medications is 76-100%.  divorced settlement completed 03/2016. Waiting for monthly financial settlement. She is getting anxious as she waits for first payment. Sleeps 4-5hrs a night (not new). Decrease appetite. Does not want medications changed at this time. Denies any ETOH use or substance abuse. Minimal caffeine use. No SI or HI.  Hyperlipidemia: No adverse side effects with medication.  Outpatient Medications Prior to Visit  Medication Sig Dispense Refill  . ALPRAZolam (XANAX) 0.25 MG tablet Take 1 tablet (0.25 mg total) by mouth 3 (three) times daily as needed for sleep or anxiety. 60 tablet 0  . Biotin (BIOTIN 5000) 5 MG CAPS Take 1 capsule by mouth daily.      . Calcium Citrate-Vitamin D (CITRACAL/VITAMIN D) 250-200 MG-UNIT TABS Take 2 tablets by mouth daily.      . DULoxetine (CYMBALTA) 20 MG capsule Take 1 capsule (20 mg total) by mouth daily. (Patient taking differently: Take 20 mg by mouth every other day. ) 90 capsule 3  . estradiol (VIVELLE-DOT) 0.05 MG/24HR patch Place 1 patch onto the skin 2 (two) times a week.     . Glucosamine Sulfate 1000 MG CAPS Take 1 capsule by mouth daily.     Marland Kitchen ibuprofen (ADVIL,MOTRIN) 800 MG tablet Take 1 tablet (800 mg total) by mouth every 8  (eight) hours as needed for mild pain. 30 tablet 0  . meclizine (ANTIVERT) 12.5 MG tablet Take 1 tablet (12.5 mg total) by mouth 3 (three) times daily as needed for dizziness. 30 tablet 0  . Omega-3 Fatty Acids (FISH OIL PO) Take 2,000 mg by mouth 2 (two) times daily.    . progesterone (PROMETRIUM) 100 MG capsule Take 100 mg by mouth daily.      Marland Kitchen ezetimibe (ZETIA) 10 MG tablet Take 1 tablet (10 mg total) by mouth daily. Need to see PCP for future refill. 30 tablet 0  . simvastatin (ZOCOR) 20 MG tablet Take 1 tablet (20 mg total) by mouth daily. 90 tablet 1  . ciprofloxacin (CIPRO) 500 MG tablet Take 1 tablet (500 mg total) by mouth 2 (two) times daily. (Patient not taking: Reported on 06/08/2017) 20 tablet 0  . fluconazole (DIFLUCAN) 150 MG tablet Take 1 tablet (150 mg total) by mouth once a week. Repeat if needed (Patient not taking: Reported on 10/17/2016) 2 tablet 0  . ondansetron (ZOFRAN-ODT) 8 MG disintegrating tablet Take 1 tablet (8 mg total) by mouth every 8 (eight) hours as needed for nausea. (Patient not taking: Reported on 06/08/2017) 15 tablet 0  . phenazopyridine (PYRIDIUM) 200 MG tablet Take 1 tablet (200 mg total) by mouth 3 (three) times daily as needed for pain. (Patient not taking: Reported on 06/08/2017) 10 tablet 0  . tamsulosin (FLOMAX) 0.4 MG CAPS capsule Take 1 capsule (0.4 mg total) by mouth daily. (Patient not taking: Reported on 06/08/2017) 30 capsule 0  . traMADol (ULTRAM) 50 MG tablet Take 1-2  tablets (50-100 mg total) by mouth every 6 (six) hours as needed for moderate pain. (Patient not taking: Reported on 06/08/2017) 15 tablet 0   No facility-administered medications prior to visit.     ROS See HPI  Objective:  BP 112/66   Pulse (!) 56   Temp 98.4 F (36.9 C)   Ht 5\' 5"  (1.651 m)   Wt 129 lb (58.5 kg)   SpO2 99%   BMI 21.47 kg/m   BP Readings from Last 3 Encounters:  06/08/17 112/66  10/17/16 120/67  10/17/16 124/75    Wt Readings from Last 3 Encounters:    06/08/17 129 lb (58.5 kg)  10/16/16 125 lb (56.7 kg)  08/31/16 127 lb (57.6 kg)    Physical Exam  Constitutional: She is oriented to person, place, and time. No distress.  Neck: Normal range of motion. Neck supple. No thyromegaly present.  Cardiovascular: Normal rate and regular rhythm.   Pulmonary/Chest: Effort normal and breath sounds normal.  Neurological: She is alert and oriented to person, place, and time.  Skin: Skin is warm and dry.  Psychiatric: She has a normal mood and affect. Her behavior is normal.  Vitals reviewed.   Lab Results  Component Value Date   WBC 18.8 (H) 10/16/2016   HGB 14.5 10/16/2016   HCT 42.0 10/16/2016   PLT 275 10/16/2016   GLUCOSE 133 (H) 10/16/2016   CHOL 229 (H) 08/31/2016   TRIG 111.0 08/31/2016   HDL 44.30 08/31/2016   LDLCALC 162 (H) 08/31/2016   ALT 14 10/16/2016   AST 27 10/16/2016   NA 140 10/16/2016   K 3.6 10/16/2016   CL 106 10/16/2016   CREATININE 1.17 (H) 10/16/2016   BUN 15 10/16/2016   CO2 24 10/16/2016   TSH 2.46 07/26/2015    Ct Renal Stone Study  Result Date: 10/17/2016 CLINICAL DATA:  Acute onset of right lower back pain, vomiting and diarrhea. Initial encounter. EXAM: CT ABDOMEN AND PELVIS WITHOUT CONTRAST TECHNIQUE: Multidetector CT imaging of the abdomen and pelvis was performed following the standard protocol without IV contrast. COMPARISON:  None. FINDINGS: Lower chest: The visualized lung bases are grossly clear. The visualized portions of the mediastinum are unremarkable. There appear to be chronically ruptured bilateral breast implants, contained by their fibrous capsules. Hepatobiliary: The liver is unremarkable in appearance. The gallbladder is unremarkable in appearance. The common bile duct remains normal in caliber. Pancreas: The pancreas is within normal limits. Spleen: The spleen is unremarkable in appearance. Adrenals/Urinary Tract: The adrenal glands are unremarkable in appearance. Mild right-sided  hydronephrosis is noted, with an obstructing 8 x 4 mm stone noted at the proximal right ureter, just below the right renal pelvis. Mild right-sided perinephric stranding is noted. The left kidney is unremarkable. No nonobstructing renal stones are identified. Stomach/Bowel: The stomach is unremarkable in appearance. The small bowel is within normal limits. The patient is status post appendectomy. The colon is unremarkable in appearance. Vascular/Lymphatic: The abdominal aorta is unremarkable in appearance. The inferior vena cava is grossly unremarkable. No retroperitoneal lymphadenopathy is seen. No pelvic sidewall lymphadenopathy is identified. Reproductive: The bladder is mildly distended and within normal limits. The uterus is grossly unremarkable in appearance. The ovaries are relatively symmetric. No suspicious adnexal masses are seen. Other: No additional soft tissue abnormalities are seen. Musculoskeletal: No acute osseous abnormalities are identified. Chronic bilateral pars defects are seen at L5, without evidence of anterolisthesis. Underlying vacuum phenomenon and endplate sclerotic change are seen, with associated broad-based disc  protrusion. The visualized musculature is unremarkable in appearance. IMPRESSION: 1. Mild right-sided hydronephrosis, with obstructing 8 x 4 mm stone in the proximal ureter, just below the right renal pelvis. 2. Chronic bilateral pars defects at L5, without evidence of anterolisthesis. Underlying vacuum phenomenon and endplate sclerotic change, with associated broad-based disc protrusion. Electronically Signed   By: Garald Balding M.D.   On: 10/17/2016 00:35    Assessment & Plan:   Melaysia was seen today for follow-up.  Diagnoses and all orders for this visit:  Anxiety  Mixed hyperlipidemia -     Lipid panel; Future -     Hepatic function panel; Future -     simvastatin (ZOCOR) 20 MG tablet; Take 1 tablet (20 mg total) by mouth daily. -     ezetimibe (ZETIA) 10 MG  tablet; Take 1 tablet (10 mg total) by mouth daily.  Family history of breast cancer in first degree relative -     Ambulatory referral to Genetics   I have discontinued Ms. Nary's ciprofloxacin, fluconazole, ondansetron, tamsulosin, phenazopyridine, and traMADol. I have also changed her ezetimibe. Additionally, I am having her maintain her Biotin, Calcium Citrate-Vitamin D, progesterone, estradiol, Omega-3 Fatty Acids (FISH OIL PO), Glucosamine Sulfate, meclizine, ALPRAZolam, DULoxetine, ibuprofen, VITAMIN E PO, and simvastatin.  Meds ordered this encounter  Medications  . VITAMIN E PO    Sig: Take by mouth.  . simvastatin (ZOCOR) 20 MG tablet    Sig: Take 1 tablet (20 mg total) by mouth daily.    Dispense:  90 tablet    Refill:  1    Order Specific Question:   Supervising Provider    Answer:   Cassandria Anger [[id number]]  . ezetimibe (ZETIA) 10 MG tablet    Sig: Take 1 tablet (10 mg total) by mouth daily.    Dispense:  90 tablet    Refill:  1    Order Specific Question:   Supervising Provider    Answer:   Cassandria Anger [[id number]]    Follow-up: Return in about 6 months (around 12/09/2017).  Wilfred Lacy, NP

## 2017-06-11 ENCOUNTER — Other Ambulatory Visit (INDEPENDENT_AMBULATORY_CARE_PROVIDER_SITE_OTHER): Payer: BLUE CROSS/BLUE SHIELD

## 2017-06-11 DIAGNOSIS — E782 Mixed hyperlipidemia: Secondary | ICD-10-CM | POA: Diagnosis not present

## 2017-06-11 LAB — LIPID PANEL
CHOL/HDL RATIO: 3
CHOLESTEROL: 139 mg/dL (ref 0–200)
HDL: 50.2 mg/dL (ref >39.00)
LDL Cholesterol: 74 mg/dL (ref 0–99)
NONHDL: 89.15
TRIGLYCERIDES: 75 mg/dL (ref 0.0–149.0)
VLDL: 15 mg/dL (ref 0.0–40.0)

## 2017-06-11 LAB — HEPATIC FUNCTION PANEL
ALBUMIN: 4.7 g/dL (ref 3.5–5.2)
ALT: 11 U/L (ref 0–35)
AST: 15 U/L (ref 0–37)
Alkaline Phosphatase: 59 U/L (ref 39–117)
BILIRUBIN DIRECT: 0.2 mg/dL (ref 0.0–0.3)
TOTAL PROTEIN: 7.4 g/dL (ref 6.0–8.3)
Total Bilirubin: 1.6 mg/dL — ABNORMAL HIGH (ref 0.2–1.2)

## 2017-06-12 ENCOUNTER — Encounter: Payer: Self-pay | Admitting: Nurse Practitioner

## 2017-08-15 ENCOUNTER — Encounter: Payer: Self-pay | Admitting: Nurse Practitioner

## 2017-08-15 DIAGNOSIS — F4323 Adjustment disorder with mixed anxiety and depressed mood: Secondary | ICD-10-CM | POA: Diagnosis not present

## 2017-09-25 ENCOUNTER — Other Ambulatory Visit (INDEPENDENT_AMBULATORY_CARE_PROVIDER_SITE_OTHER): Payer: BLUE CROSS/BLUE SHIELD

## 2017-09-25 ENCOUNTER — Encounter: Payer: Self-pay | Admitting: Internal Medicine

## 2017-09-25 ENCOUNTER — Ambulatory Visit (INDEPENDENT_AMBULATORY_CARE_PROVIDER_SITE_OTHER): Payer: BLUE CROSS/BLUE SHIELD | Admitting: Internal Medicine

## 2017-09-25 VITALS — BP 130/82 | HR 61 | Temp 98.4°F | Ht 65.0 in | Wt 136.0 lb

## 2017-09-25 DIAGNOSIS — L659 Nonscarring hair loss, unspecified: Secondary | ICD-10-CM

## 2017-09-25 DIAGNOSIS — Z23 Encounter for immunization: Secondary | ICD-10-CM | POA: Diagnosis not present

## 2017-09-25 LAB — VITAMIN D 25 HYDROXY (VIT D DEFICIENCY, FRACTURES): VITD: 30.47 ng/mL (ref 30.00–100.00)

## 2017-09-25 LAB — CBC
HEMATOCRIT: 40.8 % (ref 36.0–46.0)
HEMOGLOBIN: 13.8 g/dL (ref 12.0–15.0)
MCHC: 33.8 g/dL (ref 30.0–36.0)
MCV: 90.2 fl (ref 78.0–100.0)
Platelets: 289 10*3/uL (ref 150.0–400.0)
RBC: 4.52 Mil/uL (ref 3.87–5.11)
RDW: 13 % (ref 11.5–15.5)
WBC: 7.8 10*3/uL (ref 4.0–10.5)

## 2017-09-25 LAB — FERRITIN: FERRITIN: 30.9 ng/mL (ref 10.0–291.0)

## 2017-09-25 LAB — VITAMIN B12: Vitamin B-12: 206 pg/mL — ABNORMAL LOW (ref 211–911)

## 2017-09-25 LAB — T4, FREE: FREE T4: 0.99 ng/dL (ref 0.60–1.60)

## 2017-09-25 LAB — TSH: TSH: 2.83 u[IU]/mL (ref 0.35–4.50)

## 2017-09-27 DIAGNOSIS — F4323 Adjustment disorder with mixed anxiety and depressed mood: Secondary | ICD-10-CM | POA: Diagnosis not present

## 2017-09-27 DIAGNOSIS — L659 Nonscarring hair loss, unspecified: Secondary | ICD-10-CM | POA: Insufficient documentation

## 2017-09-27 NOTE — Assessment & Plan Note (Signed)
Checking TSH, CMP, vitamin D and B12 and CBC. Adjust as needed.

## 2017-09-27 NOTE — Progress Notes (Signed)
   Subjective:    Patient ID: [name], female    DOB: [birth year], 61 y.o.   MRN: [id number]  HPI The patient is a 61 YO female coming in for alopecia and hair thinning. She has been noticing in the last 2-3 weeks that more hair is falling out. She denies change to medications or vitamins. She does have some fatigue and weight is stable lately. Denies skin rash. No change to soap or shampoo. She denies fevers or chills.   Review of Systems  Constitutional: Negative.   HENT: Negative.        Hair thinning  Eyes: Negative.   Respiratory: Negative for cough, chest tightness and shortness of breath.   Cardiovascular: Negative for chest pain, palpitations and leg swelling.  Gastrointestinal: Negative for abdominal distention, abdominal pain, constipation, diarrhea, nausea and vomiting.  Musculoskeletal: Negative.   Skin: Negative.       Objective:   Physical Exam  Constitutional: She is oriented to person, place, and time. She appears well-developed and well-nourished.  HENT:  Head: Normocephalic and atraumatic.  Some hair thinning posterior along the midline. No circular patches missing.   Eyes: EOM are normal.  Neck: Normal range of motion.  Cardiovascular: Normal rate and regular rhythm.   Pulmonary/Chest: Effort normal and breath sounds normal. No respiratory distress. She has no wheezes. She has no rales.  Abdominal: Soft. Bowel sounds are normal. She exhibits no distension. There is no tenderness. There is no rebound.  Musculoskeletal: She exhibits no edema.  Neurological: She is alert and oriented to person, place, and time. Coordination normal.  Skin: Skin is warm and dry.   Vitals:   09/25/17 1533  BP: 130/82  Pulse: 61  Temp: 98.4 F (36.9 C)  TempSrc: Oral  SpO2: 99%  Weight: 136 lb (61.7 kg)  Height: 5\' 5"  (1.651 m)      Assessment & Plan:  Flu shot given at visit

## 2017-10-10 ENCOUNTER — Encounter: Payer: Self-pay | Admitting: Internal Medicine

## 2017-10-10 DIAGNOSIS — E782 Mixed hyperlipidemia: Secondary | ICD-10-CM

## 2017-10-11 MED ORDER — SIMVASTATIN 20 MG PO TABS: 20.0000 mg | ORAL_TABLET | Freq: Every day | ORAL | 1 refills | Status: DC

## 2017-10-15 DIAGNOSIS — F4323 Adjustment disorder with mixed anxiety and depressed mood: Secondary | ICD-10-CM | POA: Diagnosis not present

## 2017-10-24 DIAGNOSIS — D2262 Melanocytic nevi of left upper limb, including shoulder: Secondary | ICD-10-CM | POA: Diagnosis not present

## 2017-10-24 DIAGNOSIS — L821 Other seborrheic keratosis: Secondary | ICD-10-CM | POA: Diagnosis not present

## 2017-10-24 DIAGNOSIS — D2261 Melanocytic nevi of right upper limb, including shoulder: Secondary | ICD-10-CM | POA: Diagnosis not present

## 2017-10-24 DIAGNOSIS — L738 Other specified follicular disorders: Secondary | ICD-10-CM | POA: Diagnosis not present

## 2017-10-27 ENCOUNTER — Encounter: Payer: Self-pay | Admitting: Internal Medicine

## 2017-10-27 DIAGNOSIS — E538 Deficiency of other specified B group vitamins: Secondary | ICD-10-CM

## 2017-10-29 DIAGNOSIS — F4323 Adjustment disorder with mixed anxiety and depressed mood: Secondary | ICD-10-CM | POA: Diagnosis not present

## 2017-10-31 DIAGNOSIS — Z1231 Encounter for screening mammogram for malignant neoplasm of breast: Secondary | ICD-10-CM | POA: Diagnosis not present

## 2017-10-31 DIAGNOSIS — Z803 Family history of malignant neoplasm of breast: Secondary | ICD-10-CM | POA: Diagnosis not present

## 2017-10-31 LAB — HM MAMMOGRAPHY

## 2017-11-21 DIAGNOSIS — F4323 Adjustment disorder with mixed anxiety and depressed mood: Secondary | ICD-10-CM | POA: Diagnosis not present

## 2018-01-14 ENCOUNTER — Encounter: Payer: Self-pay | Admitting: Internal Medicine

## 2018-01-14 DIAGNOSIS — E782 Mixed hyperlipidemia: Secondary | ICD-10-CM

## 2018-01-15 MED ORDER — SIMVASTATIN 20 MG PO TABS: 20.0000 mg | ORAL_TABLET | Freq: Every day | ORAL | 1 refills | Status: DC

## 2018-01-15 MED ORDER — EZETIMIBE 10 MG PO TABS: 10.0000 mg | ORAL_TABLET | Freq: Every day | ORAL | 1 refills | Status: DC

## 2018-01-15 NOTE — Telephone Encounter (Signed)
Please advise,ok to send, would you like to see pt as well?

## 2018-01-24 DIAGNOSIS — Z6822 Body mass index (BMI) 22.0-22.9, adult: Secondary | ICD-10-CM | POA: Diagnosis not present

## 2018-01-24 DIAGNOSIS — Z13820 Encounter for screening for osteoporosis: Secondary | ICD-10-CM | POA: Diagnosis not present

## 2018-01-24 DIAGNOSIS — Z01419 Encounter for gynecological examination (general) (routine) without abnormal findings: Secondary | ICD-10-CM | POA: Diagnosis not present

## 2018-02-06 DIAGNOSIS — Z8042 Family history of malignant neoplasm of prostate: Secondary | ICD-10-CM | POA: Diagnosis not present

## 2018-02-06 DIAGNOSIS — Z803 Family history of malignant neoplasm of breast: Secondary | ICD-10-CM | POA: Diagnosis not present

## 2018-02-16 ENCOUNTER — Encounter (HOSPITAL_COMMUNITY): Payer: Self-pay | Admitting: Emergency Medicine

## 2018-02-16 ENCOUNTER — Emergency Department (HOSPITAL_COMMUNITY)
Admission: EM | Admit: 2018-02-16 | Discharge: 2018-02-16 | Disposition: A | Discharge status: Home / Self Care | Payer: BLUE CROSS/BLUE SHIELD | Attending: Emergency Medicine | Admitting: Emergency Medicine

## 2018-02-16 ENCOUNTER — Emergency Department (HOSPITAL_COMMUNITY): Payer: BLUE CROSS/BLUE SHIELD

## 2018-02-16 DIAGNOSIS — R1030 Lower abdominal pain, unspecified: Secondary | ICD-10-CM | POA: Diagnosis not present

## 2018-02-16 DIAGNOSIS — R109 Unspecified abdominal pain: Secondary | ICD-10-CM | POA: Diagnosis not present

## 2018-02-16 DIAGNOSIS — Z87891 Personal history of nicotine dependence: Secondary | ICD-10-CM | POA: Insufficient documentation

## 2018-02-16 DIAGNOSIS — K529 Noninfective gastroenteritis and colitis, unspecified: Secondary | ICD-10-CM | POA: Diagnosis not present

## 2018-02-16 DIAGNOSIS — Z79899 Other long term (current) drug therapy: Secondary | ICD-10-CM | POA: Insufficient documentation

## 2018-02-16 DIAGNOSIS — R1110 Vomiting, unspecified: Secondary | ICD-10-CM | POA: Diagnosis not present

## 2018-02-16 DIAGNOSIS — K5900 Constipation, unspecified: Secondary | ICD-10-CM | POA: Diagnosis not present

## 2018-02-16 DIAGNOSIS — R112 Nausea with vomiting, unspecified: Secondary | ICD-10-CM | POA: Diagnosis not present

## 2018-02-16 LAB — CBC
HEMATOCRIT: 45 % (ref 36.0–46.0)
Hemoglobin: 15.4 g/dL — ABNORMAL HIGH (ref 12.0–15.0)
MCH: 31.1 pg (ref 26.0–34.0)
MCHC: 34.2 g/dL (ref 30.0–36.0)
MCV: 90.9 fL (ref 78.0–100.0)
PLATELETS: 305 10*3/uL (ref 150–400)
RBC: 4.95 MIL/uL (ref 3.87–5.11)
RDW: 13.6 % (ref 11.5–15.5)
WBC: 18.3 10*3/uL — AB (ref 4.0–10.5)

## 2018-02-16 LAB — COMPREHENSIVE METABOLIC PANEL
ALBUMIN: 4.7 g/dL (ref 3.5–5.0)
ALT: 25 U/L (ref 14–54)
AST: 33 U/L (ref 15–41)
Alkaline Phosphatase: 71 U/L (ref 38–126)
Anion gap: 11 (ref 5–15)
BUN: 18 mg/dL (ref 6–20)
CHLORIDE: 102 mmol/L (ref 101–111)
CO2: 26 mmol/L (ref 22–32)
Calcium: 9.9 mg/dL (ref 8.9–10.3)
Creatinine, Ser: 0.86 mg/dL (ref 0.44–1.00)
GFR calc Af Amer: >60 mL/min (ref >60)
GLUCOSE: 159 mg/dL — AB (ref 65–99)
Potassium: 3.6 mmol/L (ref 3.5–5.1)
SODIUM: 139 mmol/L (ref 135–145)
Total Bilirubin: 1.9 mg/dL — ABNORMAL HIGH (ref 0.3–1.2)
Total Protein: 8.1 g/dL (ref 6.5–8.1)

## 2018-02-16 LAB — LIPASE, BLOOD: LIPASE: 29 U/L (ref 11–51)

## 2018-02-16 MED ORDER — SODIUM CHLORIDE 0.9 % IV BOLUS (SEPSIS)
1000.0000 mL | Freq: Once | INTRAVENOUS | Status: AC
  Administered 2018-02-16: 1000 mL via INTRAVENOUS

## 2018-02-16 MED ORDER — FLUCONAZOLE 50 MG PO TABS: 150.0000 mg | ORAL_TABLET | Freq: Once | ORAL | 0 refills | Status: DC | PRN

## 2018-02-16 MED ORDER — ONDANSETRON HCL 4 MG/2ML IJ SOLN
4.0000 mg | Freq: Once | INTRAMUSCULAR | Status: AC
  Administered 2018-02-16: 4 mg via INTRAVENOUS
  Filled 2018-02-16: qty 2

## 2018-02-16 MED ORDER — CIPROFLOXACIN HCL 500 MG PO TABS: 500.0000 mg | ORAL_TABLET | Freq: Two times a day (BID) | ORAL | 0 refills | Status: AC

## 2018-02-16 MED ORDER — IOPAMIDOL (ISOVUE-300) INJECTION 61%
INTRAVENOUS | Status: AC
  Administered 2018-02-16: 100 mL
  Filled 2018-02-16: qty 100

## 2018-02-16 MED ORDER — MORPHINE SULFATE (PF) 4 MG/ML IV SOLN
4.0000 mg | Freq: Once | INTRAVENOUS | Status: AC | PRN
  Administered 2018-02-16: 4 mg via INTRAVENOUS
  Filled 2018-02-16: qty 1

## 2018-02-16 MED ORDER — ONDANSETRON 4 MG PO TBDP: 4.0000 mg | ORAL_TABLET | Freq: Three times a day (TID) | ORAL | 0 refills | Status: DC | PRN

## 2018-02-16 NOTE — Discharge Instructions (Signed)
Please read instructions below. Drink clear liquids until your stomach feels better. Then, slowly introduce bland foods into your diet as tolerated, such as bread, rice, apples, bananas. You can take zofran every 8 hours as needed for nausea. Take the antibiotic as prescribed until it is gone.  Take the diflucan as needed for yeast infection. Follow up with your primary care if symptoms persist. Return to the ER for severe abdominal pain, fever, uncontrollable vomiting, or new or concerning symptoms.

## 2018-02-16 NOTE — ED Provider Notes (Signed)
[facility] DEPT Provider Note   CSN: [id number] Arrival date & time: 02/16/18  0827     History   Chief Complaint Chief Complaint  Patient presents with  . Abdominal Pain  . Nausea  . Emesis    HPI [name] is a 62 y.o. female with past medical history of GERD, anxiety, hyperlipidemia, presenting to the ED with 3 days of constipation.  Patient states she had no bowel movement nor was passing gas for 3 days, which was abnormal for her.  She states she took Dulcolax yesterday and woke up early this morning with nausea, vomiting and bloody diarrhea.  She states she has been having cramping intermittent lower abdominal pain since that time.  Denies fever or chills, appetite change, urinary symptoms.  Denies history of hemorrhoids.  No other medications tried for symptoms.  Past abdominal surgeries include appendectomy.  The history is provided by the patient.    Past Medical History:  Diagnosis Date  . Acid reflux   . 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 Depression   . Hyperlipidemia     Patient Active Problem List   Diagnosis Date Noted  . Hair loss 09/27/2017  . OA (osteoarthritis) of knee 03/20/2017  . Trigger finger, acquired 10/13/2014  . Palpitations 10/21/2013  . At risk for coronary artery disease 10/21/2013  . Anxiety   . Hyperlipidemia 06/25/2009    Past Surgical History:  Procedure Laterality Date  . APPENDECTOMY    . BREAST ENHANCEMENT SURGERY    . CYSTOSCOPY WITH RETROGRADE PYELOGRAM, URETEROSCOPY AND STENT PLACEMENT Right 10/17/2016   Procedure: CYSTOSCOPY WITH RETROGRADE PYELOGRAM, URETEROSCOPY LASER LITHOTRIPSY AND STENT PLACEMENT;  Surgeon: Ardis Hughs, MD;  Location: [facility] ORS;  Service: Urology;  Laterality: Right;  . DILATION AND CURETTAGE OF UTERUS    . maxillofacial surgery     for TMJ  . TONSILLECTOMY    . UPPER GASTROINTESTINAL ENDOSCOPY  2002   Dr Michail Sermon    OB History    No data  available       Home Medications    Prior to Admission medications   Medication Sig Start Date End Date Taking? Authorizing Provider  ALPRAZolam (XANAX) 0.25 MG tablet Take 1 tablet (0.25 mg total) by mouth 3 (three) times daily as needed for sleep or anxiety. 07/27/16  Yes Hoyt Koch, MD  Biotin (BIOTIN 5000) 5 MG CAPS Take 1 capsule by mouth daily.     Yes [provider]  bisacodyl (DULCOLAX) 5 MG EC tablet Take 5 mg by mouth daily as needed for moderate constipation.   Yes [provider]  Calcium Citrate-Vitamin D (CITRACAL/VITAMIN D) 250-200 MG-UNIT TABS Take 2 tablets by mouth daily.     Yes [provider]  estradiol (VIVELLE-DOT) 0.05 MG/24HR patch Place 1 patch onto the skin 2 (two) times a week. Change patch on wednesdays and sundays   Yes [provider]  ezetimibe (ZETIA) 10 MG tablet Take 1 tablet (10 mg total) by mouth daily. 01/15/18  Yes Nche, Charlene Brooke, NP  Glucosamine Sulfate 1000 MG CAPS Take 1 capsule by mouth daily.    Yes [provider]  ibuprofen (ADVIL,MOTRIN) 200 MG tablet Take 400 mg by mouth every 6 (six) hours as needed for moderate pain.   Yes [provider]  Omega-3 Fatty Acids (FISH OIL PO) Take 2,000 mg by mouth 2 (two) times daily.   Yes [provider]  progesterone ([place])  100 MG capsule Take 100 mg by mouth daily.     Yes [provider]  simvastatin (ZOCOR) 20 MG tablet Take 1 tablet (20 mg total) by mouth daily. 01/15/18  Yes Nche, Charlene Brooke, NP  vitamin B-12 (CYANOCOBALAMIN) 1000 MCG tablet Take 1,000 mcg by mouth daily.   Yes [provider]  ciprofloxacin (CIPRO) 500 MG tablet Take 1 tablet (500 mg total) by mouth 2 (two) times daily for 3 days. 02/16/18 02/19/18  Jacynda Brunke, [place] N, PA-C  DULoxetine (CYMBALTA) 20 MG capsule Take 1 capsule (20 mg total) by mouth daily. Patient not taking: Reported on 02/16/2018 07/27/16   Hoyt Koch, MD    fluconazole (DIFLUCAN) 50 MG tablet Take 3 tablets (150 mg total) by mouth once as needed for up to 1 dose (yeast infection). 02/16/18   Loye Vento, [place] N, PA-C  ondansetron (ZOFRAN ODT) 4 MG disintegrating tablet Take 1 tablet (4 mg total) by mouth every 8 (eight) hours as needed for nausea or vomiting. 02/16/18   Dorsie Burich, [place] N, PA-C    Family History Family History  Problem Relation Age of Onset  . Stroke Father 20       CVA multiple  . Heart disease Father   . Emphysema Mother   . Cancer Sister        BREAST CANCER  . Alzheimer's disease Brother   . Diabetes Brother   . Stroke Brother   . Cancer Sister        BREAST CANCER  . Breast cancer Maternal Aunt   . Colon cancer Neg Hx     Social History Social History   Tobacco Use  . Smoking status: Former Smoker    Packs/day: 0.30    Years: 4.00    Pack years: 1.20    Last attempt to quit: 12/04/1992    Years since quitting: 25.2  . Smokeless tobacco: Never Used  Substance Use Topics  . Alcohol use: No    Alcohol/week: 0.0 oz    Comment: quit 2.5 yrs ago  . Drug use: No     Allergies   Codeine phosphate; Darvon [propoxyphene]; Oxycodone-acetaminophen; Propoxyphene n-acetaminophen; and Sulfamethoxazole   Review of Systems Review of Systems  Constitutional: Negative for chills and fever.  Gastrointestinal: Positive for abdominal pain, blood in stool, constipation, diarrhea, nausea and vomiting. Negative for rectal pain.  Genitourinary: Negative for dysuria and frequency.  All other systems reviewed and are negative.    Physical Exam Updated Vital Signs BP 118/67   Pulse 82   Temp 98.4 F (36.9 C) (Oral)   Resp 18   SpO2 100%   Physical Exam  Constitutional: She appears well-developed and well-nourished. She does not appear ill.  HENT:  Head: Normocephalic and atraumatic.  Mouth/Throat: Oropharynx is clear and moist.  Eyes: Conjunctivae are normal.  Cardiovascular: Normal rate, regular rhythm, normal  heart sounds and intact distal pulses.  Pulmonary/Chest: Effort normal and breath sounds normal. No respiratory distress.  Abdominal: Soft. Bowel sounds are normal. She exhibits no distension and no mass. There is no tenderness. There is no rebound and no guarding.  Neurological: She is alert.  Skin: Skin is warm.  Psychiatric: She has a normal mood and affect. Her behavior is normal.  Nursing note and vitals reviewed.    ED Treatments / Results  Labs (all labs ordered are listed, but only abnormal results are displayed) Labs Reviewed  COMPREHENSIVE METABOLIC PANEL - Abnormal; Notable for the following components:  Result Value   Glucose, Bld 159 (*)    Total Bilirubin 1.9 (*)    All other components within normal limits  CBC - Abnormal; Notable for the following components:   WBC 18.3 (*)    Hemoglobin 15.4 (*)    All other components within normal limits  LIPASE, BLOOD  URINALYSIS, ROUTINE W REFLEX MICROSCOPIC    EKG  EKG Interpretation None       Radiology Ct Abdomen Pelvis W Contrast  Addendum Date: 02/16/2018   ADDENDUM REPORT: 02/16/2018 16:38 ADDENDUM: Comment: There are breast implants bilaterally. Electronically Signed   By: Lowella Grip III M.D.   On: 02/16/2018 16:38   Result Date: 02/16/2018 CLINICAL DATA:  Lower abdominal pain with nausea and vomiting EXAM: CT ABDOMEN AND PELVIS WITH CONTRAST TECHNIQUE: Multidetector CT imaging of the abdomen and pelvis was performed using the standard protocol following bolus administration of intravenous contrast. CONTRAST:  172mL ISOVUE-300 IOPAMIDOL (ISOVUE-300) INJECTION 61% COMPARISON:  November 24, 2016 FINDINGS: Lower chest: There is slight bibasilar lung atelectatic change. Hepatobiliary: No focal liver lesions are apparent. Gallbladder wall is not appreciably thickened. There is no biliary duct dilatation. Pancreas: There is no pancreatic mass or inflammatory focus. Spleen: There is a 1.2 x 1.0 cm cyst along the  anterior, medial aspect of the spleen. No splenic lesion evident. Adrenals/Urinary Tract: Adrenals bilaterally appear normal. There is a cyst arising from the anterior upper pole left kidney measuring 0.9 x 0.9 cm. There is no hydronephrosis on either side. There is no appreciable renal or ureteral calculus on either side. Urinary bladder is midline with wall thickness within normal limits. Stomach/Bowel: There is wall thickening throughout most of the descending colon without appreciable diverticular disease in this area. There is no abnormal fluid or abscess along the descending colon. No perforation evident. No similar bowel wall thickening is evident on this study. No bowel obstruction. No free air or portal venous air. Vascular/Lymphatic: No abdominal aortic aneurysm. No vascular lesions are appreciable. There is no adenopathy in the abdomen or pelvis. Reproductive: Uterus is anteverted.  No pelvic mass. Other: Appendix absent. No abscess or ascites is evident in the abdomen or pelvis. Musculoskeletal: There is degenerative change with disc space narrowing and bony overgrowth at L5-S1. There are no blastic or lytic bone lesions. There is no intramuscular or abdominal wall lesion. IMPRESSION: 1. Evidence of colitis involving the descending colon. No diverticular disease evident in this region. Etiology for this colitis is uncertain. Infectious colitis is statistically the most likely etiology. Bowel elsewhere appears unremarkable. 2.  Appendix absent.  No abscess in the abdomen or pelvis. 3. No renal or ureteral calculus evident. No hydronephrosis on either side. Electronically Signed: By: Lowella Grip III M.D. On: 02/16/2018 16:12    Procedures Procedures (including critical care time)  Medications Ordered in ED Medications  sodium chloride 0.9 % bolus 1,000 mL (0 mLs Intravenous Stopped 02/16/18 1648)  ondansetron (ZOFRAN) injection 4 mg (4 mg Intravenous Given 02/16/18 1439)  morphine 4 MG/ML  injection 4 mg (4 mg Intravenous Given 02/16/18 1439)  iopamidol (ISOVUE-300) 61 % injection (100 mLs  Contrast Given 02/16/18 1518)     Initial Impression / Assessment and Plan / ED Course  I have reviewed the triage vital signs and the nursing notes.  Pertinent labs & imaging results that were available during my care of the patient were reviewed by me and considered in my medical decision making (see chart for details).    Patient  presenting to the ED with acute onset of nausea, vomiting and bloody diarrhea that began early this morning.  Symptoms preceded by 3 days of constipation for which patient took a laxative last night.  On exam, patient is well-appearing, nontoxic.  Abdomen is soft and nontender.  Given history of symptoms, CT abdomen ordered to rule out obstruction.  Labs with leukocytosis, CMP and lipase unremarkable.  CT abdomen showing colitis of the descending colon, no evidence of obstruction or other acute intra-abdominal pathology.  Suspect diagnosis is secondary to constipation, which was then relieved by laxative.  Patient's symptoms significantly improved in the ED.  Tolerating p.o. fluids prior to discharge.  Will send with short course of ciprofloxacin to cover possible infectious etiology of colitis.  Strict return precautions discussed.  Safe for discharge.  Discussed results, findings, treatment and follow up. Patient advised of return precautions. Patient verbalized understanding and agreed with plan.  Final Clinical Impressions(s) / ED Diagnoses   Final diagnoses:  Colitis    ED Discharge Orders        Ordered    ciprofloxacin (CIPRO) 500 MG tablet  2 times daily     02/16/18 1705    ondansetron (ZOFRAN ODT) 4 MG disintegrating tablet  Every 8 hours PRN     02/16/18 1705    fluconazole (DIFLUCAN) 50 MG tablet  Once PRN     02/16/18 1705       Nayshawn Mesta, [place] N, PA-C 02/16/18 Louisa Second, MD 02/17/18 1623

## 2018-02-16 NOTE — ED Notes (Signed)
Patient states she's been "peeing and throwing up all night since 2am, I've got nothing to give you." In regards to urine.

## 2018-02-16 NOTE — ED Triage Notes (Signed)
Patient here from home with complaints of lower abdominal pain, crampy. Nausea, vomiting. Reports constipation for 3 days. Took dulcolax and started having nausea, vomiting, rectal bleeding. Denies hemorrhoids.

## 2018-02-18 DIAGNOSIS — M170 Bilateral primary osteoarthritis of knee: Secondary | ICD-10-CM | POA: Diagnosis not present

## 2018-03-20 DIAGNOSIS — Z809 Family history of malignant neoplasm, unspecified: Secondary | ICD-10-CM | POA: Diagnosis not present

## 2018-05-28 DIAGNOSIS — H04123 Dry eye syndrome of bilateral lacrimal glands: Secondary | ICD-10-CM | POA: Diagnosis not present

## 2018-06-05 ENCOUNTER — Encounter: Payer: Self-pay | Admitting: Internal Medicine

## 2018-06-07 ENCOUNTER — Encounter: Payer: Self-pay | Admitting: Internal Medicine

## 2018-06-10 ENCOUNTER — Encounter: Payer: Self-pay | Admitting: Internal Medicine

## 2018-06-10 NOTE — Progress Notes (Unsigned)
Abstracted and sent to scan  

## 2018-06-14 ENCOUNTER — Other Ambulatory Visit (INDEPENDENT_AMBULATORY_CARE_PROVIDER_SITE_OTHER): Payer: BLUE CROSS/BLUE SHIELD

## 2018-06-14 ENCOUNTER — Encounter: Payer: Self-pay | Admitting: Internal Medicine

## 2018-06-14 ENCOUNTER — Ambulatory Visit (INDEPENDENT_AMBULATORY_CARE_PROVIDER_SITE_OTHER): Payer: BLUE CROSS/BLUE SHIELD | Admitting: Internal Medicine

## 2018-06-14 VITALS — BP 100/70 | HR 60 | Temp 98.8°F | Ht 65.0 in | Wt 122.0 lb

## 2018-06-14 DIAGNOSIS — Z0000 Encounter for general adult medical examination without abnormal findings: Secondary | ICD-10-CM | POA: Diagnosis not present

## 2018-06-14 DIAGNOSIS — F5101 Primary insomnia: Secondary | ICD-10-CM

## 2018-06-14 DIAGNOSIS — G4700 Insomnia, unspecified: Secondary | ICD-10-CM | POA: Insufficient documentation

## 2018-06-14 LAB — COMPREHENSIVE METABOLIC PANEL
ALK PHOS: 51 U/L (ref 39–117)
ALT: 16 U/L (ref 0–35)
AST: 18 U/L (ref 0–37)
Albumin: 4.6 g/dL (ref 3.5–5.2)
BILIRUBIN TOTAL: 2.1 mg/dL — AB (ref 0.2–1.2)
BUN: 19 mg/dL (ref 6–23)
CO2: 28 mEq/L (ref 19–32)
CREATININE: 0.89 mg/dL (ref 0.40–1.20)
Calcium: 9.9 mg/dL (ref 8.4–10.5)
Chloride: 106 mEq/L (ref 96–112)
GFR: 68.25 mL/min (ref >60.00)
Glucose, Bld: 123 mg/dL — ABNORMAL HIGH (ref 70–99)
Potassium: 3.9 mEq/L (ref 3.5–5.1)
Sodium: 142 mEq/L (ref 135–145)
TOTAL PROTEIN: 7.1 g/dL (ref 6.0–8.3)

## 2018-06-14 LAB — VITAMIN B12: VITAMIN B 12: 856 pg/mL (ref 211–911)

## 2018-06-14 LAB — CBC
HCT: 41 % (ref 36.0–46.0)
HEMOGLOBIN: 14 g/dL (ref 12.0–15.0)
MCHC: 34.2 g/dL (ref 30.0–36.0)
MCV: 88.6 fl (ref 78.0–100.0)
PLATELETS: 259 10*3/uL (ref 150.0–400.0)
RBC: 4.62 Mil/uL (ref 3.87–5.11)
RDW: 13.4 % (ref 11.5–15.5)
WBC: 6.3 10*3/uL (ref 4.0–10.5)

## 2018-06-14 LAB — LIPID PANEL
Cholesterol: 99 mg/dL (ref 0–200)
HDL: 48.8 mg/dL (ref >39.00)
LDL Cholesterol: 31 mg/dL (ref 0–99)
NonHDL: 49.79
Total CHOL/HDL Ratio: 2
Triglycerides: 92 mg/dL (ref 0.0–149.0)
VLDL: 18.4 mg/dL (ref 0.0–40.0)

## 2018-06-14 LAB — TSH: TSH: 3 u[IU]/mL (ref 0.35–4.50)

## 2018-06-14 MED ORDER — ZOLPIDEM TARTRATE 5 MG PO TABS: 5.0000 mg | ORAL_TABLET | Freq: Every evening | ORAL | 5 refills | Status: DC | PRN

## 2018-06-14 NOTE — Patient Instructions (Signed)
We have sent in the ambien to take at night time for sleeping.

## 2018-06-14 NOTE — Assessment & Plan Note (Signed)
Rx for ambien 5 mg to try for sleeping. This is a severe problem causing her disruption of her life. Prior tried [place] which caused dry mouth and hangover symptoms. She is aware about risk of dependence, addiction, memory change and fall risk.

## 2018-06-14 NOTE — Progress Notes (Signed)
   Subjective:    Patient ID: [name], female    DOB: [birth year], 62 y.o.   MRN: [id number]  HPI The patient is a 62 YO female coming in for recurrent problem of insomnia. She had this problem with menopause when she was going through hot flashes. She recently stopped taking HRT in May. Since that time she has been having a terrible time falling asleep. She denies waking up a lot. She usually wakes around 2am and cannot get back to sleep. She sleeps maybe 2 hours per night. She is tired during the day. She is getting problems with directions due to tiredness. She is having to drink more caffeine to work and she does not like this. She did take ambien for sleep 1/2 pill previously and was able to tolerate this. She did not have sleepwalking or dream changes. She did not have any hangover symptoms in the morning at all.   Review of Systems  Constitutional: Negative.   Respiratory: Negative for cough, chest tightness and shortness of breath.   Cardiovascular: Negative for chest pain, palpitations and leg swelling.  Gastrointestinal: Negative for abdominal distention, abdominal pain, constipation, diarrhea, nausea and vomiting.  Musculoskeletal: Negative.   Skin: Negative.   Neurological: Negative.   Psychiatric/Behavioral: Positive for decreased concentration and sleep disturbance.      Objective:   Physical Exam  Constitutional: She is oriented to person, place, and time. She appears well-developed and well-nourished.  HENT:  Head: Normocephalic and atraumatic.  Eyes: EOM are normal.  Neck: Normal range of motion.  Cardiovascular: Normal rate and regular rhythm.  Pulmonary/Chest: Effort normal and breath sounds normal. No respiratory distress. She has no wheezes. She has no rales.  Abdominal: Soft. Bowel sounds are normal. She exhibits no distension. There is no tenderness. There is no rebound.  Musculoskeletal: She exhibits no edema.  Neurological: She is alert and oriented to  person, place, and time. Coordination normal.  Skin: Skin is warm and dry.  Psychiatric: She has a normal mood and affect.   Vitals:   06/14/18 1545  BP: 100/70  Pulse: 60  Temp: 98.8 F (37.1 C)  TempSrc: Oral  SpO2: 99%  Weight: 122 lb (55.3 kg)  Height: 5\' 5"  (1.651 m)      Assessment & Plan:

## 2018-06-14 NOTE — Progress Notes (Signed)
Abstracted and sent to scan  

## 2018-07-15 ENCOUNTER — Other Ambulatory Visit: Payer: Self-pay | Admitting: Nurse Practitioner

## 2018-07-15 DIAGNOSIS — E782 Mixed hyperlipidemia: Secondary | ICD-10-CM

## 2018-07-16 ENCOUNTER — Other Ambulatory Visit: Payer: Self-pay | Admitting: Nurse Practitioner

## 2018-07-16 DIAGNOSIS — E782 Mixed hyperlipidemia: Secondary | ICD-10-CM

## 2018-08-12 ENCOUNTER — Encounter: Payer: Self-pay | Admitting: Internal Medicine

## 2018-08-12 ENCOUNTER — Ambulatory Visit (INDEPENDENT_AMBULATORY_CARE_PROVIDER_SITE_OTHER): Payer: BLUE CROSS/BLUE SHIELD | Admitting: Internal Medicine

## 2018-08-12 ENCOUNTER — Other Ambulatory Visit (INDEPENDENT_AMBULATORY_CARE_PROVIDER_SITE_OTHER): Payer: BLUE CROSS/BLUE SHIELD

## 2018-08-12 VITALS — BP 110/78 | HR 54 | Temp 98.1°F | Ht 65.0 in | Wt 118.0 lb

## 2018-08-12 DIAGNOSIS — Z23 Encounter for immunization: Secondary | ICD-10-CM

## 2018-08-12 DIAGNOSIS — L659 Nonscarring hair loss, unspecified: Secondary | ICD-10-CM | POA: Diagnosis not present

## 2018-08-12 DIAGNOSIS — F5101 Primary insomnia: Secondary | ICD-10-CM

## 2018-08-12 DIAGNOSIS — E538 Deficiency of other specified B group vitamins: Secondary | ICD-10-CM | POA: Diagnosis not present

## 2018-08-12 LAB — COMPREHENSIVE METABOLIC PANEL
ALK PHOS: 56 U/L (ref 39–117)
ALT: 18 U/L (ref 0–35)
AST: 18 U/L (ref 0–37)
Albumin: 4.7 g/dL (ref 3.5–5.2)
BILIRUBIN TOTAL: 1.9 mg/dL — AB (ref 0.2–1.2)
BUN: 17 mg/dL (ref 6–23)
CALCIUM: 10 mg/dL (ref 8.4–10.5)
CO2: 28 mEq/L (ref 19–32)
Chloride: 106 mEq/L (ref 96–112)
Creatinine, Ser: 0.84 mg/dL (ref 0.40–1.20)
GFR: 72.92 mL/min (ref >60.00)
GLUCOSE: 85 mg/dL (ref 70–99)
POTASSIUM: 3.8 meq/L (ref 3.5–5.1)
Sodium: 142 mEq/L (ref 135–145)
TOTAL PROTEIN: 7.2 g/dL (ref 6.0–8.3)

## 2018-08-12 LAB — CBC
HCT: 40.2 % (ref 36.0–46.0)
Hemoglobin: 14 g/dL (ref 12.0–15.0)
MCHC: 34.7 g/dL (ref 30.0–36.0)
MCV: 87.7 fl (ref 78.0–100.0)
PLATELETS: 256 10*3/uL (ref 150.0–400.0)
RBC: 4.59 Mil/uL (ref 3.87–5.11)
RDW: 13.5 % (ref 11.5–15.5)
WBC: 6.8 10*3/uL (ref 4.0–10.5)

## 2018-08-12 LAB — TSH: TSH: 2.93 u[IU]/mL (ref 0.35–4.50)

## 2018-08-12 LAB — VITAMIN D 25 HYDROXY (VIT D DEFICIENCY, FRACTURES): VITD: 35.11 ng/mL (ref 30.00–100.00)

## 2018-08-12 LAB — VITAMIN B12: VITAMIN B 12: 616 pg/mL (ref 211–911)

## 2018-08-12 LAB — T4, FREE: FREE T4: 2.19 ng/dL — AB (ref 0.60–1.60)

## 2018-08-12 LAB — FERRITIN: Ferritin: 49 ng/mL (ref 10.0–291.0)

## 2018-08-12 MED ORDER — ZOLPIDEM TARTRATE ER 6.25 MG PO TBCR: 6.2500 mg | EXTENDED_RELEASE_TABLET | Freq: Every evening | ORAL | 0 refills | Status: DC | PRN

## 2018-08-12 NOTE — Patient Instructions (Addendum)
They do make an ambien controlled release 6.25 mg to think about trying.   We are checking the labs today and will send you the results.

## 2018-08-12 NOTE — Progress Notes (Signed)
   Subjective:    Patient ID: [name], female    DOB: 27-Feb-1956, 62 y.o.   MRN: [id number]  HPI The patient is a 62 YO female coming in for several concerns including insomnia (taking 1/4 pill ambien 5 mg for sleep, generally keeps her asleep for about 4 hours, she is having some side effects with it, no sedation with awaking, not getting much sleep, denies SI/HI or depression, does have a lot of stress at work), and hair thinning (since she stopped HRT in May, denies balding patch, overall more hair falling out, some new growth but not as robust as normal, her hairdresser advised her to get checked for this), and her B12 deficiency (taking oral B12 daily, is not sure if this is related to her hair changes, denies nail changes, some fatigue and not sure if this is related to her sleeping problems).   Review of Systems  Constitutional: Positive for fatigue.  HENT: Negative.        Hair thinning  Eyes: Negative.   Respiratory: Negative for cough, chest tightness and shortness of breath.   Cardiovascular: Negative for chest pain, palpitations and leg swelling.  Gastrointestinal: Negative for abdominal distention, abdominal pain, constipation, diarrhea, nausea and vomiting.  Musculoskeletal: Negative.   Skin: Negative.   Neurological: Negative.   Psychiatric/Behavioral: Positive for sleep disturbance.      Objective:   Physical Exam  Constitutional: She is oriented to person, place, and time. She appears well-developed and well-nourished.  HENT:  Head: Normocephalic and atraumatic.  Some hair thinning and crown is visible on the scalp, no patching alopecia, no rash on the scalp.   Eyes: EOM are normal.  Neck: Normal range of motion.  Cardiovascular: Normal rate and regular rhythm.  Pulmonary/Chest: Effort normal and breath sounds normal. No respiratory distress. She has no wheezes. She has no rales.  Abdominal: Soft. Bowel sounds are normal. She exhibits no distension. There is no  tenderness. There is no rebound.  Musculoskeletal: She exhibits no edema.  Neurological: She is alert and oriented to person, place, and time. Coordination normal.  Skin: Skin is warm and dry.  Psychiatric: She has a normal mood and affect.   Vitals:   08/12/18 1553  BP: 110/78  Pulse: (!) 54  Temp: 98.1 F (36.7 C)  TempSrc: Oral  SpO2: 99%  Weight: 118 lb (53.5 kg)  Height: 5\' 5"  (1.651 m)      Assessment & Plan:  Flu shot given at visit

## 2018-08-13 ENCOUNTER — Encounter: Payer: Self-pay | Admitting: Internal Medicine

## 2018-08-13 DIAGNOSIS — E538 Deficiency of other specified B group vitamins: Secondary | ICD-10-CM | POA: Insufficient documentation

## 2018-08-13 NOTE — Assessment & Plan Note (Signed)
Checking thyroid, B12, ferritin, vitamin D, CBC, CMP for cause although we talked about how stopping HRT can take 6-12 months to level out with hair changes.

## 2018-08-13 NOTE — Assessment & Plan Note (Signed)
Change ambien 5 mg to [employer] CR 6.25 mg to try 1/2 pill at night time for sleep. We would like her to sleep more to help with sleep deficit.

## 2018-08-13 NOTE — Assessment & Plan Note (Signed)
Checking B12 level today, she is taking oral replacement 100 mg daily otc.

## 2018-08-28 IMAGING — CT CT RENAL STONE PROTOCOL
2 of 4 series · 15 of 46 positions shown, 17 images · non-contrast
Comparison: None.

CLINICAL DATA: Acute onset of right lower back pain, vomiting and
diarrhea. Initial encounter.

EXAM:
CT ABDOMEN AND PELVIS WITHOUT CONTRAST
TECHNIQUE: Multidetector CT imaging of the abdomen and pelvis was performed
following the standard protocol without IV contrast.

[Series 2: stone study 5.0 i30f 1 · axial · 0.68mm/px · z∈[-700,-300]mm · 12 of 88 slices shown, 14 images]
[im 4/88  soft-tissue]
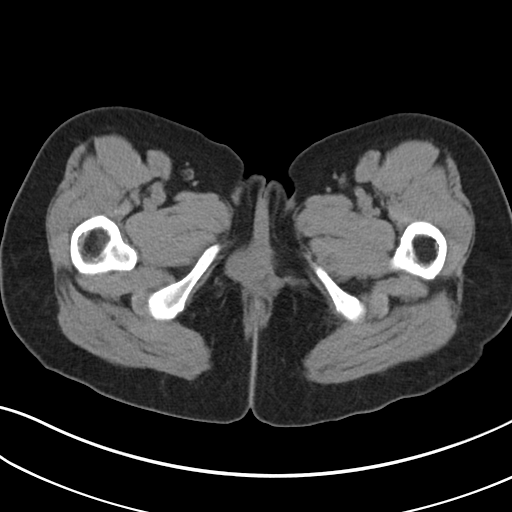
[im 4/88  bone]
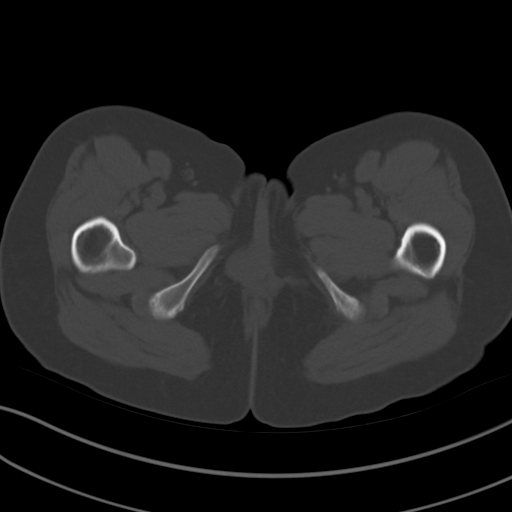
[im 11/88  soft-tissue]
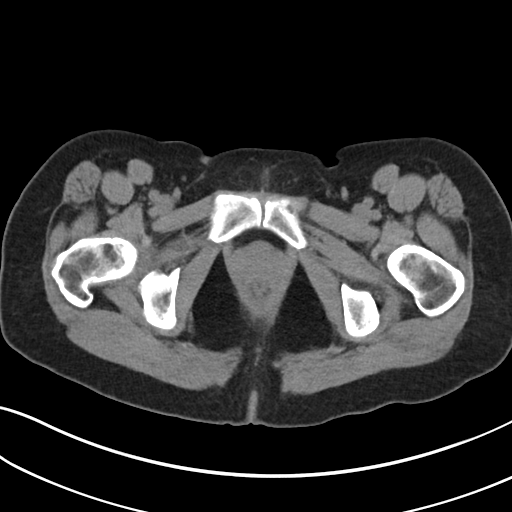
[im 19/88  soft-tissue]
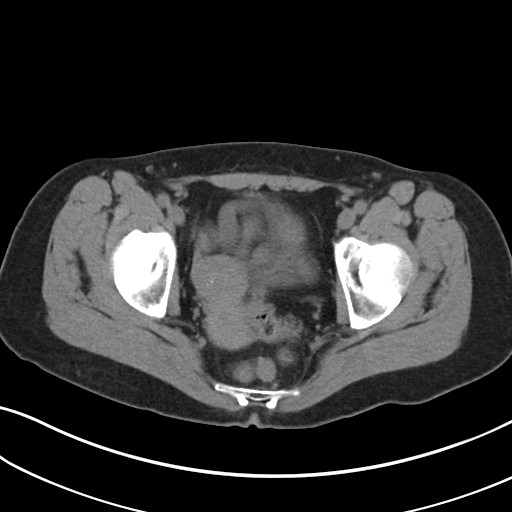
[im 26/88  soft-tissue]
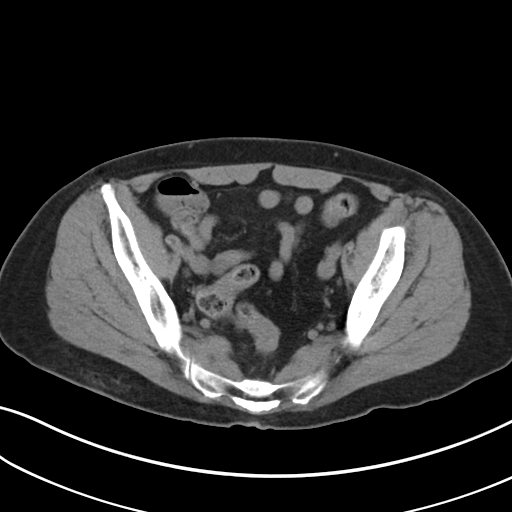
[im 33/88  soft-tissue]
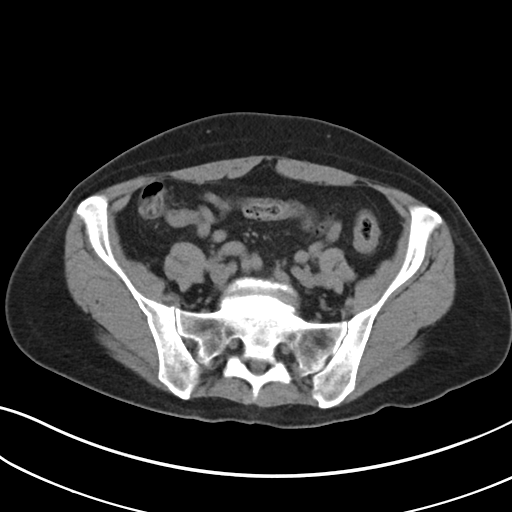
[im 40/88  soft-tissue]
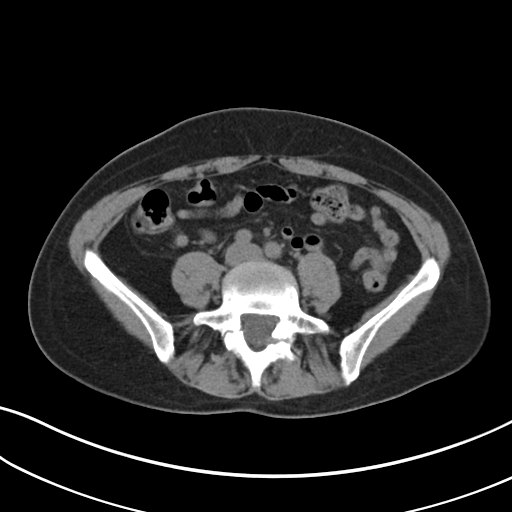
[im 48/88  soft-tissue]
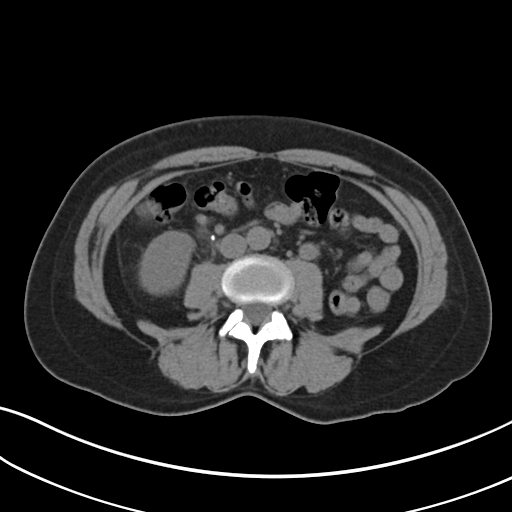
[im 55/88  soft-tissue]
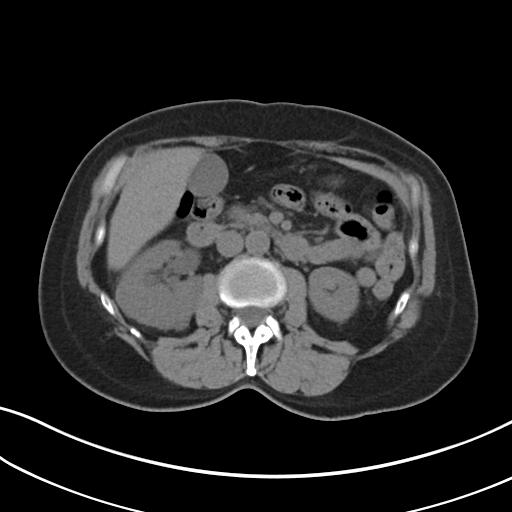
[im 62/88  soft-tissue]
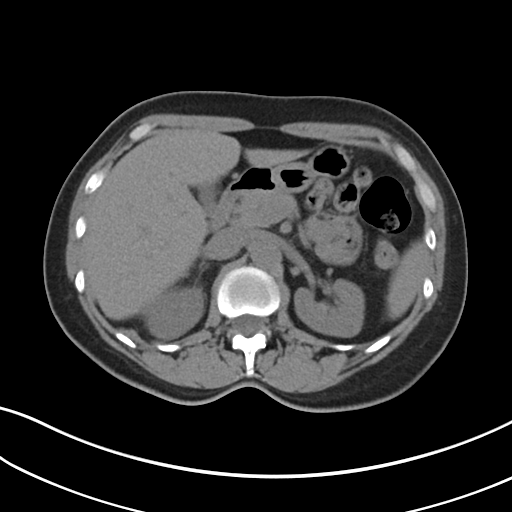
[im 62/88  bone]
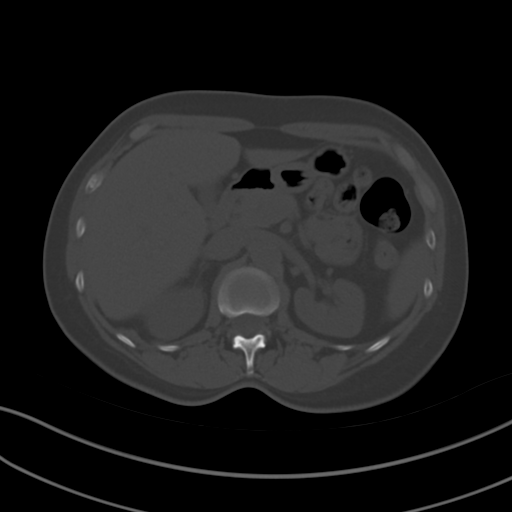
[im 69/88  soft-tissue]
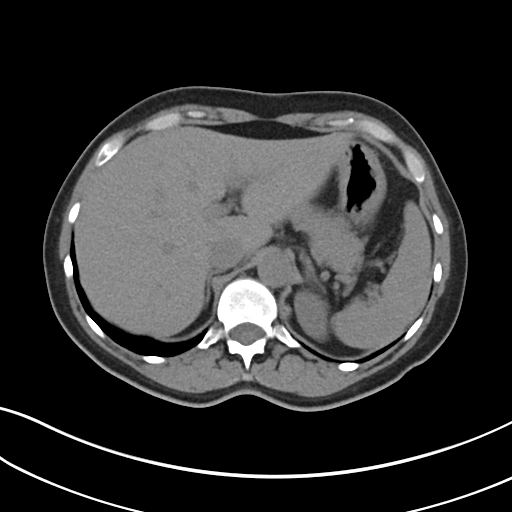
[im 77/88  soft-tissue]
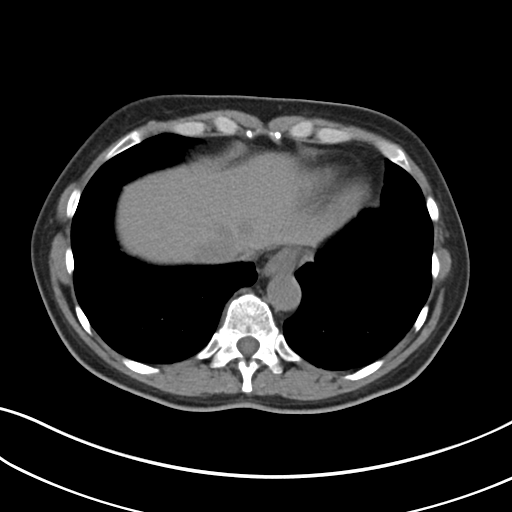
[im 84/88  soft-tissue]
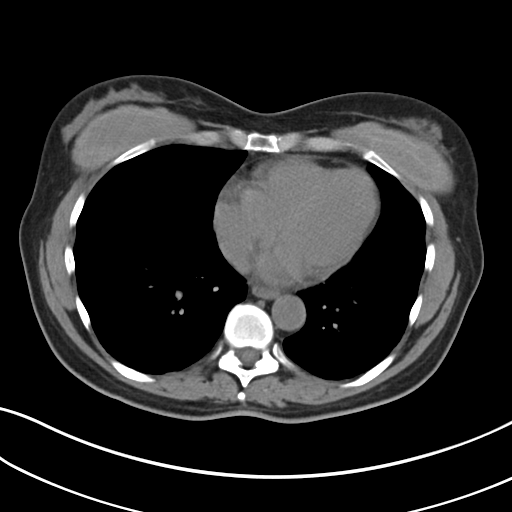

[Series 5: coronal soft tissue · coronal · 0.67mm/px · 3 of 72 slices shown]
[im 24/72  soft-tissue]
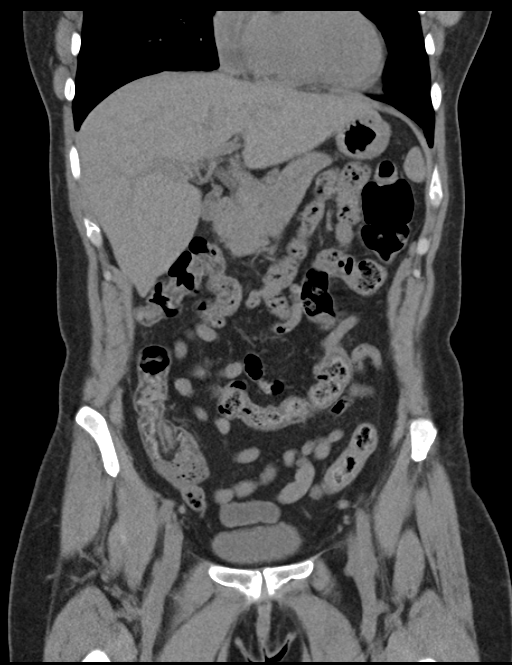
[im 32/72  soft-tissue]
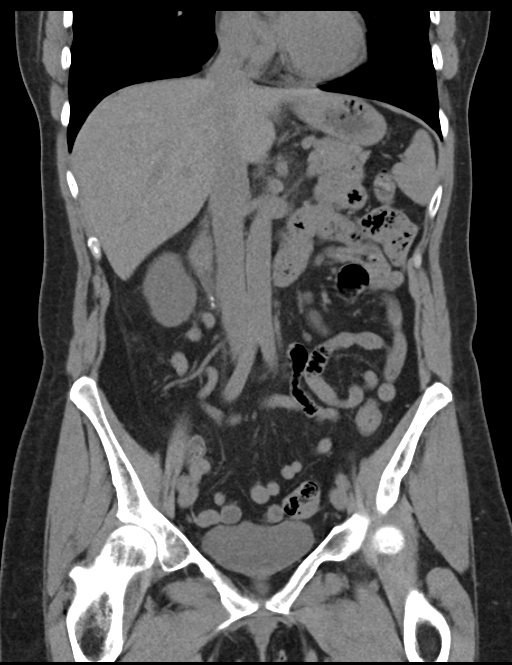
[im 40/72  soft-tissue]
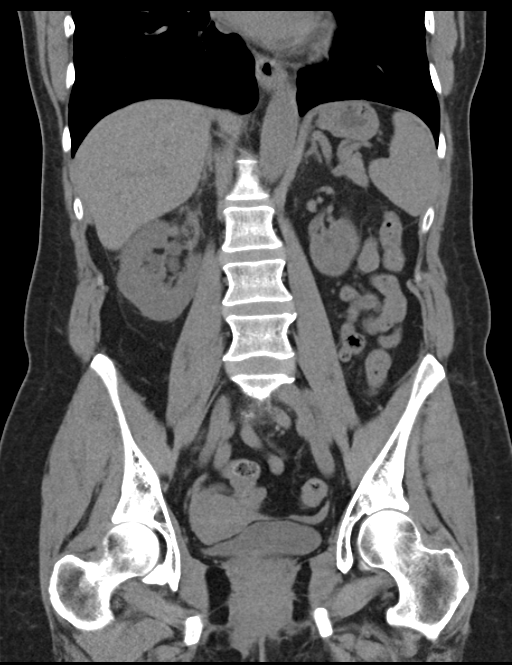

[15 of 46 positions shown; findings below may reference images not displayed]

FINDINGS: Lower chest: The visualized lung bases are grossly clear. The
visualized portions of the mediastinum are unremarkable. There
appear to be chronically ruptured bilateral breast implants,
contained by their fibrous capsules.

Hepatobiliary: The liver is unremarkable in appearance. The
gallbladder is unremarkable in appearance. The common bile duct
remains normal in caliber.

Pancreas: The pancreas is within normal limits.

Spleen: The spleen is unremarkable in appearance.

Adrenals/Urinary Tract: The adrenal glands are unremarkable in
appearance.

Mild right-sided hydronephrosis is noted, with an obstructing 8 x 4
mm stone noted at the proximal right ureter, just below the right
renal pelvis. Mild right-sided perinephric stranding is noted. The
left kidney is unremarkable. No nonobstructing renal stones are
identified.

Stomach/Bowel: The stomach is unremarkable in appearance. The small
bowel is within normal limits. The patient is status post
appendectomy. The colon is unremarkable in appearance.

Vascular/Lymphatic: The abdominal aorta is unremarkable in
appearance. The inferior vena cava is grossly unremarkable. No
retroperitoneal lymphadenopathy is seen. No pelvic sidewall
lymphadenopathy is identified.

Reproductive: The bladder is mildly distended and within normal
limits. The uterus is grossly unremarkable in appearance. The
ovaries are relatively symmetric. No suspicious adnexal masses are
seen.

Other: No additional soft tissue abnormalities are seen.

Musculoskeletal: No acute osseous abnormalities are identified.
Chronic bilateral pars defects are seen at L5, without evidence of
anterolisthesis. Underlying vacuum phenomenon and endplate sclerotic
change are seen, with associated broad-based disc protrusion. The
visualized musculature is unremarkable in appearance.
IMPRESSION: 1. Mild right-sided hydronephrosis, with obstructing 8 x 4 mm stone
in the proximal ureter, just below the right renal pelvis.
2. Chronic bilateral pars defects at L5, without evidence of
anterolisthesis. Underlying vacuum phenomenon and endplate sclerotic
change, with associated broad-based disc protrusion.

## 2018-09-11 DIAGNOSIS — M1711 Unilateral primary osteoarthritis, right knee: Secondary | ICD-10-CM | POA: Diagnosis not present

## 2018-09-16 ENCOUNTER — Encounter: Payer: Self-pay | Admitting: Internal Medicine

## 2018-10-16 ENCOUNTER — Encounter: Payer: Self-pay | Admitting: Internal Medicine

## 2018-10-16 ENCOUNTER — Other Ambulatory Visit: Payer: Self-pay | Admitting: Internal Medicine

## 2018-10-16 MED ORDER — ZOLPIDEM TARTRATE ER 6.25 MG PO TBCR: 6.2500 mg | EXTENDED_RELEASE_TABLET | Freq: Every evening | ORAL | 5 refills | Status: DC | PRN

## 2018-10-24 ENCOUNTER — Other Ambulatory Visit: Payer: Self-pay

## 2018-10-24 ENCOUNTER — Encounter: Payer: Self-pay | Admitting: Internal Medicine

## 2018-10-24 DIAGNOSIS — E782 Mixed hyperlipidemia: Secondary | ICD-10-CM

## 2018-10-24 MED ORDER — SIMVASTATIN 20 MG PO TABS: 20.0000 mg | ORAL_TABLET | Freq: Every day | ORAL | 2 refills | Status: DC

## 2018-10-30 ENCOUNTER — Other Ambulatory Visit: Payer: Self-pay | Admitting: Internal Medicine

## 2018-10-30 ENCOUNTER — Encounter: Payer: Self-pay | Admitting: Internal Medicine

## 2018-10-30 MED ORDER — ALPRAZOLAM 0.25 MG PO TABS: 0.2500 mg | ORAL_TABLET | Freq: Every day | ORAL | 0 refills | Status: DC | PRN

## 2018-10-30 NOTE — Telephone Encounter (Signed)
Check [place] registry no last filled date on file.Marland KitchenJohny Harrison

## 2018-10-30 NOTE — Telephone Encounter (Signed)
Sent in

## 2018-10-30 NOTE — Addendum Note (Signed)
Addended by: Pricilla Holm A on: 10/30/2018 09:59 AM   Modules accepted: Orders

## 2018-10-30 NOTE — Telephone Encounter (Signed)
Last refill at [facility] 07/27/2016 LOV: 08/12/2018 acute, 06/14/2018 [facility] NOV: none

## 2018-11-05 ENCOUNTER — Encounter: Payer: Self-pay | Admitting: Internal Medicine

## 2018-11-06 MED ORDER — DULOXETINE HCL 20 MG PO CPEP: 20.0000 mg | ORAL_CAPSULE | Freq: Every day | ORAL | 3 refills | Status: DC

## 2018-11-09 DIAGNOSIS — Z1231 Encounter for screening mammogram for malignant neoplasm of breast: Secondary | ICD-10-CM | POA: Diagnosis not present

## 2018-12-16 ENCOUNTER — Encounter: Payer: Self-pay | Admitting: Internal Medicine

## 2018-12-27 ENCOUNTER — Other Ambulatory Visit: Payer: Self-pay | Admitting: Internal Medicine

## 2018-12-27 NOTE — Telephone Encounter (Signed)
Please advise per D.r Crawford's absence  Called pharmacy last refill 10/30/2018 30 tabs LOV: 08/12/2018 [id number]

## 2019-01-02 ENCOUNTER — Encounter: Payer: Self-pay | Admitting: Internal Medicine

## 2019-01-10 ENCOUNTER — Ambulatory Visit (INDEPENDENT_AMBULATORY_CARE_PROVIDER_SITE_OTHER): Payer: BLUE CROSS/BLUE SHIELD | Admitting: Internal Medicine

## 2019-01-10 ENCOUNTER — Encounter: Payer: Self-pay | Admitting: Internal Medicine

## 2019-01-10 VITALS — BP 110/80 | HR 59 | Temp 98.4°F | Ht 65.0 in | Wt 114.0 lb

## 2019-01-10 DIAGNOSIS — R8290 Unspecified abnormal findings in urine: Secondary | ICD-10-CM

## 2019-01-10 LAB — POCT URINALYSIS DIPSTICK
Bilirubin, UA: NEGATIVE
Blood, UA: NEGATIVE
GLUCOSE UA: NEGATIVE
KETONES UA: NEGATIVE
NITRITE UA: NEGATIVE
PROTEIN UA: NEGATIVE
Spec Grav, UA: <=1.005 — AB (ref 1.010–1.025)
Urobilinogen, UA: 0.2 E.U./dL
pH, UA: 6 (ref 5.0–8.0)

## 2019-01-10 MED ORDER — FLUCONAZOLE 150 MG PO TABS: 150.0000 mg | ORAL_TABLET | Freq: Once | ORAL | 0 refills | Status: AC

## 2019-01-10 MED ORDER — ZOLPIDEM TARTRATE ER 6.25 MG PO TBCR: 6.2500 mg | EXTENDED_RELEASE_TABLET | Freq: Every evening | ORAL | 5 refills | Status: DC | PRN

## 2019-01-10 MED ORDER — NITROFURANTOIN MONOHYD MACRO 100 MG PO CAPS: 100.0000 mg | ORAL_CAPSULE | Freq: Two times a day (BID) | ORAL | 0 refills | Status: DC

## 2019-01-10 NOTE — Patient Instructions (Signed)
We have sent in the macrobid to take for the urinary infection 1 pill twice a day for 1 week.   We have sent in the diflucan to use for yeast infection.   We have sent in the [place] again as well.

## 2019-01-10 NOTE — Progress Notes (Signed)
   Subjective:   Patient ID: [name], female    DOB: [birth year], 63 y.o.   MRN: [id number]  HPI The patient is a 63 YO female coming in for concerns about uti. She has had cloudy urine for several weeks. Denies burning or frequency. Denies stomach or back pain. Denies fevers or chills. Denies nausea or vomiting. She does have some urine odor as well recently. Has not tried anything for this. Started about 3 weeks ago and the odor has developed in the last week or so. Overall stable since onset. She does have a new partner but states he was checked for stds and claims he was negative.  Review of Systems  Constitutional: Negative.   HENT: Negative.   Eyes: Negative.   Respiratory: Negative for cough, chest tightness and shortness of breath.   Cardiovascular: Negative for chest pain, palpitations and leg swelling.  Gastrointestinal: Negative for abdominal distention, abdominal pain, constipation, diarrhea, nausea and vomiting.  Genitourinary:       Urine cloudy and odor  Musculoskeletal: Negative.   Skin: Negative.   Neurological: Negative.   Psychiatric/Behavioral: Negative.     Objective:  Physical Exam Constitutional:      Appearance: She is well-developed.  HENT:     Head: Normocephalic and atraumatic.  Neck:     Musculoskeletal: Normal range of motion.  Cardiovascular:     Rate and Rhythm: Normal rate and regular rhythm.  Pulmonary:     Effort: Pulmonary effort is normal. No respiratory distress.     Breath sounds: Normal breath sounds. No wheezing or rales.  Abdominal:     General: Bowel sounds are normal. There is no distension.     Palpations: Abdomen is soft.     Tenderness: There is no abdominal tenderness. There is no rebound.  Skin:    General: Skin is warm and dry.  Neurological:     Mental Status: She is alert and oriented to person, place, and time.     Coordination: Coordination normal.     Vitals:   01/10/19 1031  BP: 110/80  Pulse: (!) 59  Temp:  98.4 F (36.9 C)  TempSrc: Oral  SpO2: 99%  Weight: 114 lb (51.7 kg)  Height: 5\' 5"  (1.651 m)    Assessment & Plan:

## 2019-01-10 NOTE — Assessment & Plan Note (Signed)
POC U/A done in office consistent with UTI. Rx for macrobid and diflucan. Talked to her about post coital voiding, increasing fluids.

## 2019-01-25 ENCOUNTER — Encounter: Payer: Self-pay | Admitting: Internal Medicine

## 2019-01-27 MED ORDER — FLUCONAZOLE 150 MG PO TABS: 150.0000 mg | ORAL_TABLET | ORAL | 0 refills | Status: AC

## 2019-02-26 ENCOUNTER — Encounter: Payer: Self-pay | Admitting: Internal Medicine

## 2019-02-26 MED ORDER — ALPRAZOLAM 0.25 MG PO TABS: ORAL_TABLET | ORAL | 0 refills | Status: DC

## 2019-03-02 ENCOUNTER — Encounter: Payer: Self-pay | Admitting: Internal Medicine

## 2019-03-03 MED ORDER — NITROFURANTOIN MONOHYD MACRO 100 MG PO CAPS: 100.0000 mg | ORAL_CAPSULE | Freq: Two times a day (BID) | ORAL | 0 refills | Status: DC

## 2019-03-03 MED ORDER — FLUCONAZOLE 150 MG PO TABS: 150.0000 mg | ORAL_TABLET | Freq: Once | ORAL | 0 refills | Status: AC

## 2019-03-25 DIAGNOSIS — J302 Other seasonal allergic rhinitis: Secondary | ICD-10-CM | POA: Insufficient documentation

## 2019-03-25 DIAGNOSIS — J0141 Acute recurrent pansinusitis: Secondary | ICD-10-CM | POA: Insufficient documentation

## 2019-03-25 DIAGNOSIS — Z9889 Other specified postprocedural states: Secondary | ICD-10-CM | POA: Diagnosis not present

## 2019-03-25 DIAGNOSIS — Z87891 Personal history of nicotine dependence: Secondary | ICD-10-CM | POA: Diagnosis not present

## 2019-04-29 ENCOUNTER — Other Ambulatory Visit: Payer: Self-pay | Admitting: Internal Medicine

## 2019-04-29 NOTE — Telephone Encounter (Signed)
Control database checked last refill: 02/26/2019  LOV: acute 01/10/2019 [id number]

## 2019-05-26 DIAGNOSIS — M1711 Unilateral primary osteoarthritis, right knee: Secondary | ICD-10-CM | POA: Diagnosis not present

## 2019-06-04 DIAGNOSIS — M1712 Unilateral primary osteoarthritis, left knee: Secondary | ICD-10-CM | POA: Diagnosis not present

## 2019-06-09 ENCOUNTER — Other Ambulatory Visit: Payer: Self-pay | Admitting: Internal Medicine

## 2019-06-09 NOTE — Telephone Encounter (Signed)
[place] Controlled Database Checked Last filled: 04/29/19 # 30 Next appt w/you: None

## 2019-06-12 DIAGNOSIS — H5203 Hypermetropia, bilateral: Secondary | ICD-10-CM | POA: Diagnosis not present

## 2019-06-30 ENCOUNTER — Encounter: Payer: Self-pay | Admitting: Internal Medicine

## 2019-07-01 ENCOUNTER — Other Ambulatory Visit: Payer: Self-pay | Admitting: Internal Medicine

## 2019-07-01 MED ORDER — FLUOXETINE HCL 20 MG PO TABS: 20.0000 mg | ORAL_TABLET | Freq: Every day | ORAL | 3 refills | Status: DC

## 2019-07-01 MED ORDER — ALPRAZOLAM 0.25 MG PO TABS: 0.2500 mg | ORAL_TABLET | Freq: Two times a day (BID) | ORAL | 0 refills | Status: DC | PRN

## 2019-07-01 NOTE — Telephone Encounter (Signed)
Control database checked last refill: 06/09/2019 30 tabs  LOV: acute 01/10/2019 NOV: none

## 2019-07-07 ENCOUNTER — Encounter: Payer: Self-pay | Admitting: Internal Medicine

## 2019-07-08 ENCOUNTER — Encounter: Payer: Self-pay | Admitting: Internal Medicine

## 2019-07-10 ENCOUNTER — Emergency Department (HOSPITAL_COMMUNITY)
Admission: EM | Admit: 2019-07-10 | Discharge: 2019-07-11 | Disposition: A | Discharge status: Home / Self Care | Payer: BC Managed Care – PPO | Attending: Emergency Medicine | Admitting: Emergency Medicine

## 2019-07-10 ENCOUNTER — Encounter: Payer: Self-pay | Admitting: Internal Medicine

## 2019-07-10 ENCOUNTER — Other Ambulatory Visit: Payer: Self-pay

## 2019-07-10 ENCOUNTER — Encounter (HOSPITAL_COMMUNITY): Payer: Self-pay | Admitting: Emergency Medicine

## 2019-07-10 ENCOUNTER — Telehealth: Payer: Self-pay | Admitting: Internal Medicine

## 2019-07-10 ENCOUNTER — Ambulatory Visit: Payer: Self-pay

## 2019-07-10 DIAGNOSIS — R112 Nausea with vomiting, unspecified: Secondary | ICD-10-CM | POA: Diagnosis not present

## 2019-07-10 DIAGNOSIS — Z79899 Other long term (current) drug therapy: Secondary | ICD-10-CM | POA: Insufficient documentation

## 2019-07-10 DIAGNOSIS — R1013 Epigastric pain: Secondary | ICD-10-CM | POA: Diagnosis not present

## 2019-07-10 DIAGNOSIS — R109 Unspecified abdominal pain: Secondary | ICD-10-CM | POA: Diagnosis not present

## 2019-07-10 DIAGNOSIS — Z87891 Personal history of nicotine dependence: Secondary | ICD-10-CM | POA: Diagnosis not present

## 2019-07-10 LAB — COMPREHENSIVE METABOLIC PANEL
ALT: 28 U/L (ref 0–44)
AST: 26 U/L (ref 15–41)
Albumin: 4.5 g/dL (ref 3.5–5.0)
Alkaline Phosphatase: 65 U/L (ref 38–126)
Anion gap: 15 (ref 5–15)
BUN: 11 mg/dL (ref 8–23)
CO2: 20 mmol/L — ABNORMAL LOW (ref 22–32)
Calcium: 9.7 mg/dL (ref 8.9–10.3)
Chloride: 103 mmol/L (ref 98–111)
Creatinine, Ser: 0.98 mg/dL (ref 0.44–1.00)
GFR calc Af Amer: >60 mL/min (ref >60)
GFR calc non Af Amer: >60 mL/min (ref >60)
Glucose, Bld: 88 mg/dL (ref 70–99)
Potassium: 3.5 mmol/L (ref 3.5–5.1)
Sodium: 138 mmol/L (ref 135–145)
Total Bilirubin: 4 mg/dL — ABNORMAL HIGH (ref 0.3–1.2)
Total Protein: 7 g/dL (ref 6.5–8.1)

## 2019-07-10 LAB — URINALYSIS, ROUTINE W REFLEX MICROSCOPIC
Bilirubin Urine: NEGATIVE
Glucose, UA: NEGATIVE mg/dL
Ketones, ur: 20 mg/dL — AB
Nitrite: NEGATIVE
Protein, ur: NEGATIVE mg/dL
Specific Gravity, Urine: 1.012 (ref 1.005–1.030)
pH: 5 (ref 5.0–8.0)

## 2019-07-10 LAB — LIPASE, BLOOD: Lipase: 40 U/L (ref 11–51)

## 2019-07-10 LAB — CBC
HCT: 38.8 % (ref 36.0–46.0)
Hemoglobin: 13.7 g/dL (ref 12.0–15.0)
MCH: 30.7 pg (ref 26.0–34.0)
MCHC: 35.3 g/dL (ref 30.0–36.0)
MCV: 87 fL (ref 80.0–100.0)
Platelets: 284 10*3/uL (ref 150–400)
RBC: 4.46 MIL/uL (ref 3.87–5.11)
RDW: 12.4 % (ref 11.5–15.5)
WBC: 6.7 10*3/uL (ref 4.0–10.5)
nRBC: 0 % (ref 0.0–0.2)

## 2019-07-10 MED ORDER — SODIUM CHLORIDE 0.9% FLUSH
3.0000 mL | Freq: Once | INTRAVENOUS | Status: AC
  Administered 2019-07-11: 3 mL via INTRAVENOUS

## 2019-07-10 NOTE — Telephone Encounter (Signed)
I would recommend ER if pain is that severe. 3 days out from prozac should not be cause. I am not sure this was cause of pain, can cause nausea.

## 2019-07-10 NOTE — Telephone Encounter (Signed)
LVM for patient to go ER now.

## 2019-07-10 NOTE — Telephone Encounter (Signed)
I called and spoke with patient today to let her know with pain being so severe Dr. Sharlet Salina had recommended that she go to the ER now for further eval. Patient voiced understanding.

## 2019-07-10 NOTE — Telephone Encounter (Signed)
Patient called and says she's been having upper abdominal pain since Tuesday. She says the pain she believes is coming from taking Prozac. She says she stopped taking it on Monday and notified Dr. Sharlet Salina via [place]. She says the pain is a 8 and she had to sleep sitting up last night it hurt so bad. She says she took a prevacid or prilosec to see if it would help, but no relief. She says she has not been able to eat anything due to the pain and nausea. She denies vomiting, no diarrhea, just passing gas. I called the office and spoke to [place], [facility] who says to let the patient know someone will call her after Dr. Sharlet Salina reviews this note. She asks to be call at work [phone].  Answer Assessment - Initial Assessment Questions 1. LOCATION: "Where does it hurt?"      Upper abdomen 2. RADIATION: "Does the pain shoot anywhere else?" (e.g., chest, back)     No 3. ONSET: "When did the pain begin?" (e.g., minutes, hours or days ago)      Tuesday 4. SUDDEN: "Gradual or sudden onset?"     Grand[place] 5. PATTERN "Does the pain come and go, or is it constant?"    - If constant: "Is it getting better, staying the same, or worsening?"      (Note: Constant means the pain never goes away completely; most serious pain is constant and it progresses)     - If intermittent: "How long does it last?" "Do you have pain now?"     (Note: Intermittent means the pain goes away completely between bouts)     Constant 6. SEVERITY: "How bad is the pain?"  (e.g., Scale 1-10; mild, moderate, or severe)    - MILD (1-3): doesn't interfere with normal activities, abdomen soft and not tender to touch     - MODERATE (4-7): interferes with normal activities or awakens from sleep, tender to touch     - SEVERE (8-10): excruciating pain, doubled over, unable to do any normal activities       8 7. RECURRENT SYMPTOM: "Have you ever had this type of abdominal pain before?" If so, ask: "When was the last time?" and "What happened that  time?"      Yes 8. AGGRAVATING FACTORS: "Does anything seem to cause this pain?" (e.g., foods, stress, alcohol)     Food 9. CARDIAC SYMPTOMS: "Do you have any of the following symptoms: chest pain, difficulty breathing, sweating, nausea?"     Nausea 10. OTHER SYMPTOMS: "Do you have any other symptoms?" (e.g., fever, vomiting, diarrhea)      Gas 11. PREGNANCY: "Is there any chance you are pregnant?" "When was your last menstrual period?"       No  Protocols used: ABDOMINAL PAIN - UPPER-A-AH

## 2019-07-10 NOTE — Telephone Encounter (Signed)
Patient called in requesting to be seen in office.  Patient was triaged by nurse (please see triage note).  States her stomach pain is an 8 and that she is doubled over in pain.   Please advise.

## 2019-07-10 NOTE — ED Triage Notes (Signed)
Pt c/o nausea/vomiting/abdominal pain x 3 days. States she passed out today. A&O x 4, ambulatory at this time.

## 2019-07-10 NOTE — Telephone Encounter (Signed)
See separate note with advice on same

## 2019-07-11 ENCOUNTER — Telehealth: Payer: Self-pay | Admitting: Gastroenterology

## 2019-07-11 ENCOUNTER — Emergency Department (HOSPITAL_COMMUNITY): Payer: BC Managed Care – PPO

## 2019-07-11 DIAGNOSIS — R109 Unspecified abdominal pain: Secondary | ICD-10-CM | POA: Diagnosis not present

## 2019-07-11 MED ORDER — MORPHINE SULFATE (PF) 4 MG/ML IV SOLN
4.0000 mg | Freq: Once | INTRAVENOUS | Status: AC
  Administered 2019-07-11: 4 mg via INTRAVENOUS
  Filled 2019-07-11: qty 1

## 2019-07-11 MED ORDER — OXYCODONE HCL 5 MG PO TABS: 5.0000 mg | ORAL_TABLET | ORAL | 0 refills | Status: DC | PRN

## 2019-07-11 MED ORDER — PANTOPRAZOLE SODIUM 40 MG PO TBEC
40.0000 mg | DELAYED_RELEASE_TABLET | Freq: Once | ORAL | Status: AC
  Administered 2019-07-11: 40 mg via ORAL
  Filled 2019-07-11: qty 1

## 2019-07-11 MED ORDER — SUCRALFATE 1 G PO TABS: 1.0000 g | ORAL_TABLET | Freq: Three times a day (TID) | ORAL | 0 refills | Status: DC

## 2019-07-11 MED ORDER — LIDOCAINE VISCOUS HCL 2 % MT SOLN
15.0000 mL | Freq: Once | OROMUCOSAL | Status: AC
  Administered 2019-07-11: 15 mL via ORAL
  Filled 2019-07-11: qty 15

## 2019-07-11 MED ORDER — ONDANSETRON 4 MG PO TBDP
8.0000 mg | ORAL_TABLET | Freq: Once | ORAL | Status: AC
  Administered 2019-07-11: 03:00:00 8 mg via ORAL
  Filled 2019-07-11: qty 2

## 2019-07-11 MED ORDER — ALUM & MAG HYDROXIDE-SIMETH 200-200-20 MG/5ML PO SUSP
30.0000 mL | Freq: Once | ORAL | Status: AC
  Administered 2019-07-11: 30 mL via ORAL
  Filled 2019-07-11: qty 30

## 2019-07-11 MED ORDER — IOHEXOL 300 MG/ML  SOLN
100.0000 mL | Freq: Once | INTRAMUSCULAR | Status: AC | PRN
  Administered 2019-07-11: 100 mL via INTRAVENOUS

## 2019-07-11 MED ORDER — ONDANSETRON HCL 4 MG PO TABS: 4.0000 mg | ORAL_TABLET | Freq: Four times a day (QID) | ORAL | 0 refills | Status: DC | PRN

## 2019-07-11 NOTE — Telephone Encounter (Signed)
Noted  

## 2019-07-11 NOTE — ED Notes (Signed)
Patient transported to CT 

## 2019-07-11 NOTE — Telephone Encounter (Signed)
I reviewed the ED notes. Please offer her a direct EGD in [place] within the next couple weeks to further evaluate or keep appt with PG as scheduled. If this option is not adequate, and [facility] has office availability, perhaps a one time appt with [facility] at [facility] office, then EGD as indicated with me or VC. After that ongoing follow up with me

## 2019-07-11 NOTE — Telephone Encounter (Signed)
Pt went to ER

## 2019-07-11 NOTE — Discharge Instructions (Addendum)
Take your Prilosec twice a day for the next two weeks, then reduce to once a day.  Return if symptoms are getting worse.

## 2019-07-11 NOTE — Telephone Encounter (Signed)
Patient was in the ED today for possible gastric ulcer. Hx with Dr. Fuller Plan in 2016 for colonoscopy only.   She is asking to be seen right away.  I reviewed the ED notes from this morning. Patient is asked to start a bland diet, begin carafate as directed by ED and increase Prilosec to 40 BID, and use the zofran as needed for nausea.  She is given a follow up appt with Tye Savoy RNP for 07/31/19.  She will try the above and call back if her symptoms fail to improve after a week.

## 2019-07-11 NOTE — Telephone Encounter (Signed)
I called and spoke with the patient and offered her an EGD.  She declines for now.  She wants to try medication tx and will call back if this does not help.  She understands to keep follow up with Tye Savoy RNP or call and we will set up the direct EGD

## 2019-07-11 NOTE — ED Provider Notes (Signed)
[facility] EMERGENCY DEPARTMENT Provider Note   CSN: [id number] Arrival date & time: 07/10/19  1713    History   Chief Complaint Chief Complaint  Patient presents with  . Abdominal Pain  . Loss of Consciousness    HPI [name] is a 63 y.o. female.   The history is provided by the patient.  She has history of hyperlipidemia, GERD and comes in with a 3-day history of epigastric pain.  Pain is sharp and nonradiating.  Pain is rated at 8/10.  There has been nausea and vomiting although she has been able to hold food down.  Eating seems to make it worse, drinking water seems to make it better.  She had a similar episode several years ago and what it was felt to be GERD.  She did take a dose of omeprazole without any relief.  Symptoms started about 6 days after she started a prescription for Prozac, but she stopped taking it because she thought the pain was related to the Prozac.  She denies tobacco and ethanol use.  Past Medical History:  Diagnosis Date  . Acid reflux   . Cancer ([place])    precancerous cervix area  . Depression   . Hyperlipidemia     Patient Active Problem List   Diagnosis Date Noted  . Abnormal urine odor 01/10/2019  . B12 deficiency 08/13/2018  . Insomnia 06/14/2018  . Hair loss 09/27/2017  . OA (osteoarthritis) of knee 03/20/2017  . Trigger finger, acquired 10/13/2014  . Palpitations 10/21/2013  . At risk for coronary artery disease 10/21/2013  . Anxiety   . Hyperlipidemia 06/25/2009    Past Surgical History:  Procedure Laterality Date  . APPENDECTOMY    . BREAST ENHANCEMENT SURGERY    . CYSTOSCOPY WITH RETROGRADE PYELOGRAM, URETEROSCOPY AND STENT PLACEMENT Right 10/17/2016   Procedure: CYSTOSCOPY WITH RETROGRADE PYELOGRAM, URETEROSCOPY LASER LITHOTRIPSY AND STENT PLACEMENT;  Surgeon: Ardis Hughs, MD;  Location: [facility];  Service: Urology;  Laterality: Right;  . DILATION AND CURETTAGE OF UTERUS    . maxillofacial surgery      for TMJ  . TONSILLECTOMY    . UPPER GASTROINTESTINAL ENDOSCOPY  2002   Dr Michail Sermon     OB History   No obstetric history on file.      Home Medications    Prior to Admission medications   Medication Sig Start Date End Date Taking? Authorizing Provider  ALPRAZolam (XANAX) 0.25 MG tablet Take 1 tablet (0.25 mg total) by mouth 2 (two) times daily as needed for anxiety. 07/01/19   Hoyt Koch, MD  Biotin (BIOTIN 5000) 5 MG CAPS Take 1 capsule by mouth daily.      [provider]  Calcium Citrate-Vitamin D (CITRACAL/VITAMIN D) 250-200 MG-UNIT TABS Take 2 tablets by mouth daily.      [provider]  ezetimibe (ZETIA) 10 MG tablet TAKE 1 TABLET ONCE DAILY. 07/16/18   Hoyt Koch, MD  FLUoxetine (PROZAC) 20 MG tablet Take 1 tablet (20 mg total) by mouth daily. 07/01/19   Hoyt Koch, MD  Glucosamine Sulfate 1000 MG CAPS Take 1 capsule by mouth daily.     [provider]  ibuprofen (ADVIL,MOTRIN) 200 MG tablet Take 400 mg by mouth every 6 (six) hours as needed for moderate pain.    [provider]  nitrofurantoin, macrocrystal-monohydrate, (MACROBID) 100 MG capsule Take 1 capsule (100 mg total) by mouth 2 (two) times daily. 03/03/19   Pricilla Holm  A, MD  Omega-3 Fatty Acids (FISH OIL PO) Take 2,000 mg by mouth 2 (two) times daily.    [provider]  ondansetron (ZOFRAN ODT) 4 MG disintegrating tablet Take 1 tablet (4 mg total) by mouth every 8 (eight) hours as needed for nausea or vomiting. 02/16/18   Robinson, [place] N, PA-C  simvastatin (ZOCOR) 20 MG tablet Take 1 tablet (20 mg total) by mouth daily. 10/24/18   Hoyt Koch, MD  vitamin B-12 (CYANOCOBALAMIN) 1000 MCG tablet Take 1,000 mcg by mouth daily.    [provider]  zolpidem (AMBIEN CR) 6.25 MG CR tablet Take 1 tablet (6.25 mg total) by mouth at bedtime as needed for sleep. 01/10/19   Hoyt Koch, MD    Family History Family  History  Problem Relation Age of Onset  . Stroke Father 66       CVA multiple  . Heart disease Father   . Emphysema Mother   . Cancer Sister        BREAST CANCER  . Alzheimer's disease Brother   . Diabetes Brother   . Stroke Brother   . Cancer Sister        BREAST CANCER  . Breast cancer Maternal Aunt   . Colon cancer Neg Hx     Social History Social History   Tobacco Use  . Smoking status: Former Smoker    Packs/day: 0.30    Years: 4.00    Pack years: 1.20    Quit date: 12/04/1992    Years since quitting: 26.6  . Smokeless tobacco: Never Used  Substance Use Topics  . Alcohol use: No    Alcohol/week: 0.0 standard drinks    Comment: quit 2.5 yrs ago  . Drug use: No     Allergies   Codeine phosphate, Darvon [propoxyphene], Oxycodone-acetaminophen, Propoxyphene n-acetaminophen, and Sulfamethoxazole   Review of Systems Review of Systems  All other systems reviewed and are negative.    Physical Exam Updated Vital Signs BP (!) 134/95   Pulse 73   Temp 98.5 F (36.9 C) (Oral)   Resp 16   SpO2 100%   Physical Exam Vitals signs and nursing note reviewed.    63 year old female, resting comfortably and in no acute distress. Vital signs are significant for mildly elevated blood pressure. Oxygen saturation is 100%, which is normal. Head is normocephalic and atraumatic. PERRLA, EOMI. Oropharynx is clear. Neck is nontender and supple without adenopathy or JVD. Back is nontender and there is no CVA tenderness. Lungs are clear without rales, wheezes, or rhonchi. Chest is nontender. Heart has regular rate and rhythm without murmur. Abdomen is soft, flat, with moderate epigastric tenderness.  There is no rebound or guarding.  There is a negative Murphy sign.  There are no masses or hepatosplenomegaly and peristalsis is slightly hypoactive. Extremities have no cyanosis or edema, full range of motion is present. Skin is warm and dry without rash. Neurologic: Mental  status is normal, cranial nerves are intact, there are no motor or sensory deficits.  ED Treatments / Results  Labs (all labs ordered are listed, but only abnormal results are displayed) Labs Reviewed  COMPREHENSIVE METABOLIC PANEL - Abnormal; Notable for the following components:      Result Value   CO2 20 (*)    Total Bilirubin 4.0 (*)    All other components within normal limits  URINALYSIS, ROUTINE W REFLEX MICROSCOPIC - Abnormal; Notable for the following components:   Hgb urine dipstick SMALL (*)  Ketones, ur 20 (*)    Leukocytes,Ua MODERATE (*)    Bacteria, UA RARE (*)    All other components within normal limits  LIPASE, BLOOD  CBC   Radiology Ct Abdomen Pelvis W Contrast  Result Date: 07/11/2019 CLINICAL DATA:  Abdominal pain with diverticulitis suspected EXAM: CT ABDOMEN AND PELVIS WITH CONTRAST TECHNIQUE: Multidetector CT imaging of the abdomen and pelvis was performed using the standard protocol following bolus administration of intravenous contrast. CONTRAST:  136mL OMNIPAQUE IOHEXOL 300 MG/ML  SOLN COMPARISON:  02/16/2018 FINDINGS: Lower chest:  Partially covered breast implants.  No acute process Hepatobiliary: No focal liver abnormality.No evidence of biliary obstruction or stone. Pancreas: Unremarkable. Spleen: Small ventral cystic density that is incidental. Adrenals/Urinary Tract: Negative adrenals. No hydronephrosis or ureteral stone. Partial collecting system obscuration from excretion of contrast. 2 Unremarkable bladder. Stomach/Bowel: No obstruction. The appendix is not visualized. No pericecal inflammation. Vascular/Lymphatic: No acute vascular abnormality. No mass or adenopathy. Reproductive:Tiny calcification in the central uterus that is stable and without visible superimposed mass. Other: No ascites or pneumoperitoneum. Musculoskeletal: No acute abnormalities. 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 Right L5-S1  foraminal impingement. IMPRESSION: No acute finding. Chronic and incidental findings noted above. Electronically Signed   By: Monte Fantasia M.D.   On: 07/11/2019 04:29    Procedures Procedures   Medications Ordered in ED Medications  sodium chloride flush (NS) 0.9 % injection 3 mL (3 mLs Intravenous Given 07/11/19 0343)  alum & mag hydroxide-simeth (MAALOX/MYLANTA) 200-200-20 MG/5ML suspension 30 mL (30 mLs Oral Given 07/11/19 0313)    And  lidocaine (XYLOCAINE) 2 % viscous mouth solution 15 mL (15 mLs Oral Given 07/11/19 0313)  ondansetron (ZOFRAN-ODT) disintegrating tablet 8 mg (8 mg Oral Given 07/11/19 0313)  pantoprazole (PROTONIX) EC tablet 40 mg (40 mg Oral Given 07/11/19 0338)  morphine 4 MG/ML injection 4 mg (4 mg Intravenous Given 07/11/19 0338)  iohexol (OMNIPAQUE) 300 MG/ML solution 100 mL (100 mLs Intravenous Contrast Given 07/11/19 0409)     Initial Impression / Assessment and Plan / ED Course  I have reviewed the triage vital signs and the nursing notes.  Pertinent labs & imaging results that were available during my care of the patient were reviewed by me and considered in my medical decision making (see chart for details).  Epigastric pain felt to be most likely gastric ulcer or peptic ulcer, possible GERD.  No right upper quadrant tenderness to suggest biliary tract disease.  Doubt pancreatitis, diverticulitis.  Laboratory work-up shows bilirubin of 4.0 but otherwise unremarkable.  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  Mother liver tests are normal.  She will be given a trial of the GI cocktail.  She did not get relief with GI cocktail.  She is given a dose of morphine and ondansetron and sent for CT of abdomen and pelvis.  CT scan is unremarkable.  She is given a dose of pantoprazole.  Patient is reassured of the benign work-up.  She has Prilosec at home and is advised to take that twice a day for the next 2 weeks, then reduce to once a day.  She is  also given a prescription for sucralfate, ondansetron, and a small number of oxycodone tablets.  Follow-up with gastroenterology.  Return precautions discussed.  Final Clinical Impressions(s) / ED Diagnoses   Final diagnoses:  Epigastric pain    ED Discharge Orders    None       Roxanne Mins,  Shanon Brow, MD 07/11/19 [phone]

## 2019-07-25 ENCOUNTER — Other Ambulatory Visit: Payer: Self-pay

## 2019-07-25 MED ORDER — SUCRALFATE 1 G PO TABS: 1.0000 g | ORAL_TABLET | Freq: Three times a day (TID) | ORAL | 0 refills | Status: DC

## 2019-07-29 ENCOUNTER — Other Ambulatory Visit: Payer: Self-pay | Admitting: Internal Medicine

## 2019-07-29 NOTE — Telephone Encounter (Signed)
Control database checked last refill: 07/03/2019 LOV: acute 01/10/2019, 08/12/2018 NOV: none

## 2019-07-31 ENCOUNTER — Ambulatory Visit (INDEPENDENT_AMBULATORY_CARE_PROVIDER_SITE_OTHER): Payer: BC Managed Care – PPO | Admitting: Nurse Practitioner

## 2019-07-31 ENCOUNTER — Other Ambulatory Visit: Payer: Self-pay

## 2019-07-31 ENCOUNTER — Encounter: Payer: Self-pay | Admitting: Nurse Practitioner

## 2019-07-31 VITALS — BP 116/78 | HR 72 | Temp 98.1°F | Ht 65.0 in | Wt 109.0 lb

## 2019-07-31 DIAGNOSIS — R110 Nausea: Secondary | ICD-10-CM | POA: Diagnosis not present

## 2019-07-31 DIAGNOSIS — R1013 Epigastric pain: Secondary | ICD-10-CM

## 2019-07-31 NOTE — Patient Instructions (Addendum)
Decrease to omeprazole to twice daily before meals.  Continue carafate before meals and at bedtime.  Call our office in 1 month with an update on how you are feeling. Our phone number is [phone].  If you are age 63 or older, your body mass index should be between 23-30. Your Body mass index is 18.14 kg/m. If this is out of the aforementioned range listed, please consider follow up with your Primary Care Provider.  If you are age 57 or younger, your body mass index should be between 19-25. Your Body mass index is 18.14 kg/m. If this is out of the aformentioned range listed, please consider follow up with your Primary Care Provider.

## 2019-07-31 NOTE — Progress Notes (Signed)
ASSESSMENT / PLAN:   61.  63 year old female seen in ED earlier this month with postprandial nonradiating epigastric pain and nausea. CMET, lipase, CBC and CTAP all unremarkable. Almost complete resolution of symptoms on high dose PPI and carafate.  -Taking Prilosec OTC 20 mg 4 times daily.  Since symptoms have resolved I have asked her to decrease the dose to twice daily before breakfast and dinner.  -She feels the Carafate has really helped.  Continue Carafate, can try to reduce dose to just 2-3 times a day.  -Patient strongly feels, and I agree that her symptoms are likely stress related. She started to cry and offered that she had lost her job in December. Despite having her resume out on many job recruiting sites she has been unable to find employment.  No longer able to afford where she lives patient is having to move.  She is fearful of becoming homeless. PCP is currently treated her for anxiety / depression . She is taking Xanax as needed but fearful of becoming dependent upon it.  -[name] will call me in 3 to 4 weeks with a condition update.  At some point will try and get her on the lowest effective dose of Carafate and Prilosec.  -Her weight loss is noted and not surprising with amount of stress she has been under. However, if epigastric pain recurs then consider EGD. We talked about this and she agrees   2.  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  She is known to Dr. Fuller Plan from a screening colonoscopy in 2016.  Patient was seen in the ED 07/10/2019 for evaluation of nausea and postprandial epigastric pain.  Pain started 3-4 days prior, it was post-prandial and No relief with GI cocktail.  Advised to take Prilosec twice daily for the next 2 weeks then reduce to once daily.  She was given prescriptions for Carafate, Zofran and  a small number of oxycodone.  Advised to follow-up with [place]. CMET, lipase, and CBC all unremarkable.  Total bili 4. No NSAID use.   [name] has been taking Prilosec OTC 20 mg 4 times daily since dismissal from the ED.  She has not had any further epigastric pain nor nausea.  She is slowly starting to reintroduce various foods back into her diet.  She is not an NSAID user.  Bowel movements are okay, no blood in stool.  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  She has to move from her current residence due to finances. She doesn't have children to help.    Past Medical History:  Diagnosis Date  . Acid reflux   . 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 Depression   . Hyperlipidemia     Past Surgical History:  Procedure Laterality Date  . APPENDECTOMY    . BREAST ENHANCEMENT SURGERY    . CYSTOSCOPY WITH RETROGRADE PYELOGRAM, URETEROSCOPY AND STENT PLACEMENT Right 10/17/2016   Procedure: CYSTOSCOPY WITH RETROGRADE PYELOGRAM, URETEROSCOPY LASER LITHOTRIPSY AND STENT PLACEMENT;  Surgeon: Ardis Hughs, MD;  Location: [facility];  Service: Urology;  Laterality: Right;  . DILATION AND CURETTAGE OF UTERUS    . maxillofacial surgery  for TMJ  . TONSILLECTOMY    . UPPER GASTROINTESTINAL ENDOSCOPY  2002   Dr Michail Sermon   Family History  Problem Relation Age of Onset  . Stroke Father 70       CVA multiple  . Heart disease Father   . Emphysema Mother   . Cancer Sister        BREAST CANCER  . Alzheimer's disease Brother   . Diabetes Brother   . Stroke Brother   . Cancer Sister        BREAST CANCER  . Breast cancer Maternal Aunt   . Colon cancer Neg Hx    Social History   Tobacco Use  . Smoking status: Former Smoker    Packs/day: 0.30    Years: 4.00    Pack years: 1.20    Quit date: 12/04/1992    Years since quitting: 26.6  . Smokeless tobacco: Never Used  Substance Use Topics  . Alcohol  use: No    Alcohol/week: 0.0 standard drinks    Comment: quit 2.5 yrs ago  . Drug use: No   Current Outpatient Medications  Medication Sig Dispense Refill  . ALPRAZolam (XANAX) 0.25 MG tablet Take 1 tablet (0.25 mg total) by mouth 2 (two) times daily as needed for anxiety. 60 tablet 0  . Biotin (BIOTIN 5000) 5 MG CAPS Take 1 capsule by mouth daily.      . Calcium Citrate-Vitamin D (CITRACAL/VITAMIN D) 250-200 MG-UNIT TABS Take 1 tablet by mouth 2 (two) times daily.     Marland Kitchen ezetimibe (ZETIA) 10 MG tablet TAKE 1 TABLET ONCE DAILY. 90 tablet 3  . FLUoxetine (PROZAC) 20 MG tablet Take 1 tablet (20 mg total) by mouth daily. 30 tablet 3  . Glucosamine Sulfate 1000 MG CAPS Take 1 capsule by mouth daily.     . Omega-3 Fatty Acids (FISH OIL PO) Take 2,000 mg by mouth 2 (two) times daily.    . simvastatin (ZOCOR) 20 MG tablet Take 1 tablet (20 mg total) by mouth daily. 90 tablet 2  . sucralfate (CARAFATE) 1 g tablet Take 1 tablet (1 g total) by mouth 4 (four) times daily -  with meals and at bedtime. 60 tablet 0  . zolpidem (AMBIEN CR) 6.25 MG CR tablet TAKE 1 TABLET BY MOUTH AT BEDTIME IF NEEDED FOR SLEEP. 30 tablet 0  . ondansetron (ZOFRAN) 4 MG tablet Take 1 tablet (4 mg total) by mouth every 6 (six) hours as needed for nausea or vomiting. (Patient not taking: Reported on 07/31/2019) 12 tablet 0  . oxyCODONE (ROXICODONE) 5 MG immediate release tablet Take 1 tablet (5 mg total) by mouth every 4 (four) hours as needed for severe pain. (Patient not taking: Reported on 07/31/2019) 10 tablet 0   No current facility-administered medications for this visit.    Allergies  Allergen Reactions  . Codeine Phosphate Nausea And Vomiting  . Darvon [Propoxyphene] Nausea And Vomiting  . Oxycodone-Acetaminophen Nausea And Vomiting  . Propoxyphene N-Acetaminophen Nausea And Vomiting  . Sulfamethoxazole Nausea And Vomiting     Review of Systems: All systems reviewed and negative except where noted in HPI.    Serum creatinine: 0.98 mg/dL 07/10/19 1721 Estimated creatinine clearance: 45.8 mL/min   Physical Exam:    Wt Readings from Last 3 Encounters:  07/31/19 109 lb (49.4 kg)  01/10/19 114 lb (51.7 kg)  08/12/18 118 lb (53.5 kg)    BP 116/78   Pulse 72   Temp 98.1 F (36.7 C) (Oral)  Ht 5\' 5"  (1.651 m)   Wt 109 lb (49.4 kg)   BMI 18.14 kg/m  Constitutional:  Pleasant female in no acute distress. Psychiatric: Normal mood and affect. Behavior is normal. EENT: Pupils normal.  Conjunctivae are normal. No scleral icterus. Neck supple.  Cardiovascular: Normal rate, regular rhythm. No edema Pulmonary/chest: Effort normal and breath sounds normal. No wheezing, rales or rhonchi. Abdominal: Soft, nondistended, nontender. Bowel sounds active throughout. There are no masses palpable. No hepatomegaly. Neurological: Alert and oriented to person place and time. Skin: Skin is warm and dry. No rashes noted.  Tye Savoy, NP  07/31/2019, 10:01 AM   Hoyt Koch, *

## 2019-07-31 NOTE — Progress Notes (Signed)
Reviewed and agree with management plan.  Mikel Hardgrove T. Alize Acy, MD FACG [facility]  

## 2019-08-07 ENCOUNTER — Other Ambulatory Visit: Payer: Self-pay | Admitting: Gastroenterology

## 2019-09-04 ENCOUNTER — Encounter: Payer: Self-pay | Admitting: Internal Medicine

## 2019-09-29 ENCOUNTER — Other Ambulatory Visit: Payer: Self-pay | Admitting: Internal Medicine

## 2019-09-29 NOTE — Telephone Encounter (Signed)
Due for follow up, call patient for appointment. Can have 30 day supply

## 2019-09-29 NOTE — Telephone Encounter (Signed)
Appointment scheduled.

## 2019-09-29 NOTE — Telephone Encounter (Signed)
Can you make patient an appointment. Thank you  

## 2019-09-29 NOTE — Telephone Encounter (Signed)
Control database checked last refill:  Alprazalam: 07/01/2019 60 tabs Zolpidem: 07/29/2019 30 tabs   LOV: acute 01/10/2019 NOV: none

## 2019-10-01 ENCOUNTER — Other Ambulatory Visit: Payer: Self-pay | Admitting: Gastroenterology

## 2019-10-22 ENCOUNTER — Other Ambulatory Visit: Payer: Self-pay | Admitting: Nurse Practitioner

## 2019-10-22 MED ORDER — SUCRALFATE 1 G PO TABS: ORAL_TABLET | ORAL | 1 refills | Status: DC

## 2019-10-23 ENCOUNTER — Ambulatory Visit (INDEPENDENT_AMBULATORY_CARE_PROVIDER_SITE_OTHER): Payer: BC Managed Care – PPO | Admitting: Internal Medicine

## 2019-10-23 ENCOUNTER — Encounter: Payer: Self-pay | Admitting: Internal Medicine

## 2019-10-23 ENCOUNTER — Other Ambulatory Visit: Payer: Self-pay

## 2019-10-23 VITALS — BP 120/70 | HR 75 | Temp 98.3°F | Ht 65.0 in | Wt 116.0 lb

## 2019-10-23 DIAGNOSIS — E782 Mixed hyperlipidemia: Secondary | ICD-10-CM

## 2019-10-23 DIAGNOSIS — F5101 Primary insomnia: Secondary | ICD-10-CM | POA: Diagnosis not present

## 2019-10-23 DIAGNOSIS — Z0000 Encounter for general adult medical examination without abnormal findings: Secondary | ICD-10-CM | POA: Diagnosis not present

## 2019-10-23 DIAGNOSIS — F419 Anxiety disorder, unspecified: Secondary | ICD-10-CM

## 2019-10-23 NOTE — Patient Instructions (Signed)
Health Maintenance, Female Adopting a healthy lifestyle and getting preventive care are important in promoting health and wellness. Ask your health care provider about:  The right schedule for you to have regular tests and exams.  Things you can do on your own to prevent diseases and keep yourself healthy. What should I know about diet, weight, and exercise? Eat a healthy diet   Eat a diet that includes plenty of vegetables, fruits, low-fat dairy products, and lean protein.  Do not eat a lot of foods that are high in solid fats, added sugars, or sodium. Maintain a healthy weight Body mass index (BMI) is used to identify weight problems. It estimates body fat based on height and weight. Your health care provider can help determine your BMI and help you achieve or maintain a healthy weight. Get regular exercise Get regular exercise. This is one of the most important things you can do for your health. Most adults should:  Exercise for at least 150 minutes each week. The exercise should increase your heart rate and make you sweat (moderate-intensity exercise).  Do strengthening exercises at least twice a week. This is in addition to the moderate-intensity exercise.  Spend less time sitting. Even light physical activity can be beneficial. Watch cholesterol and blood lipids Have your blood tested for lipids and cholesterol at 63 years of age, then have this test every 5 years. Have your cholesterol levels checked more often if:  Your lipid or cholesterol levels are high.  You are older than 63 years of age.  You are at high risk for heart disease. What should I know about cancer screening? Depending on your health history and family history, you may need to have cancer screening at various ages. This may include screening for:  Breast cancer.  Cervical cancer.  Colorectal cancer.  Skin cancer.  Lung cancer. What should I know about heart disease, diabetes, and high blood  pressure? Blood pressure and heart disease  High blood pressure causes heart disease and increases the risk of stroke. This is more likely to develop in people who have high blood pressure readings, are of African descent, or are overweight.  Have your blood pressure checked: ? Every 3-5 years if you are 18-39 years of age. ? Every year if you are 40 years old or older. Diabetes Have regular diabetes screenings. This checks your fasting blood sugar level. Have the screening done:  Once every three years after age 40 if you are at a normal weight and have a low risk for diabetes.  More often and at a younger age if you are overweight or have a high risk for diabetes. What should I know about preventing infection? Hepatitis B If you have a higher risk for hepatitis B, you should be screened for this virus. Talk with your health care provider to find out if you are at risk for hepatitis B infection. Hepatitis C Testing is recommended for:  Everyone born from 1945 through 1965.  Anyone with known risk factors for hepatitis C. Sexually transmitted infections (STIs)  Get screened for STIs, including gonorrhea and chlamydia, if: ? You are sexually active and are younger than 63 years of age. ? You are older than 63 years of age and your health care provider tells you that you are at risk for this type of infection. ? Your sexual activity has changed since you were last screened, and you are at increased risk for chlamydia or gonorrhea. Ask your health care provider if   you are at risk.  Ask your health care provider about whether you are at high risk for HIV. Your health care provider may recommend a prescription medicine to help prevent HIV infection. If you choose to take medicine to prevent HIV, you should first get tested for HIV. You should then be tested every 3 months for as long as you are taking the medicine. Pregnancy  If you are about to stop having your period (premenopausal) and  you may become pregnant, seek counseling before you get pregnant.  Take 400 to 800 micrograms (mcg) of folic acid every day if you become pregnant.  Ask for birth control (contraception) if you want to prevent pregnancy. Osteoporosis and menopause Osteoporosis is a disease in which the bones lose minerals and strength with aging. This can result in bone fractures. If you are 65 years old or older, or if you are at risk for osteoporosis and fractures, ask your health care provider if you should:  Be screened for bone loss.  Take a calcium or vitamin D supplement to lower your risk of fractures.  Be given hormone replacement therapy (HRT) to treat symptoms of menopause. Follow these instructions at home: Lifestyle  Do not use any products that contain nicotine or tobacco, such as cigarettes, e-cigarettes, and chewing tobacco. If you need help quitting, ask your health care provider.  Do not use street drugs.  Do not share needles.  Ask your health care provider for help if you need support or information about quitting drugs. Alcohol use  Do not drink alcohol if: ? Your health care provider tells you not to drink. ? You are pregnant, may be pregnant, or are planning to become pregnant.  If you drink alcohol: ? Limit how much you use to 0-1 drink a day. ? Limit intake if you are breastfeeding.  Be aware of how much alcohol is in your drink. In the [place] one drink equals one 12 oz bottle of beer (355 mL), one 5 oz glass of wine (148 mL), or one 1 oz glass of hard liquor (44 mL). General instructions  Schedule regular health, dental, and eye exams.  Stay current with your vaccines.  Tell your health care provider if: ? You often feel depressed. ? You have ever been abused or do not feel safe at home. Summary  Adopting a healthy lifestyle and getting preventive care are important in promoting health and wellness.  Follow your health care provider's instructions about healthy  diet, exercising, and getting tested or screened for diseases.  Follow your health care provider's instructions on monitoring your cholesterol and blood pressure. This information is not intended to replace advice given to you by your health care provider. Make sure you discuss any questions you have with your health care provider. Document Released: 06/05/2011 Document Revised: 11/13/2018 Document Reviewed: 11/13/2018 Elsevier Patient Education  2020 Elsevier Inc.  

## 2019-10-23 NOTE — Progress Notes (Signed)
   Subjective:   Patient ID: [name], female    DOB: 23-Aug-1956, 63 y.o.   MRN: [id number]  HPI The patient is a 63 YO female coming in for physical. Still struggling some but off prozac and feels she is doing okay without it.   PMH, [facility], social history reviewed and updated  Review of Systems  Constitutional: Negative.   HENT: Negative.   Eyes: Negative.   Respiratory: Negative for cough, chest tightness and shortness of breath.   Cardiovascular: Negative for chest pain, palpitations and leg swelling.  Gastrointestinal: Negative for abdominal distention, abdominal pain, constipation, diarrhea, nausea and vomiting.  Musculoskeletal: Negative.   Skin: Negative.   Neurological: Negative.   Psychiatric/Behavioral: Negative.     Objective:  Physical Exam Constitutional:      Appearance: She is well-developed.  HENT:     Head: Normocephalic and atraumatic.  Neck:     Musculoskeletal: Normal range of motion.  Cardiovascular:     Rate and Rhythm: Normal rate and regular rhythm.  Pulmonary:     Effort: Pulmonary effort is normal. No respiratory distress.     Breath sounds: Normal breath sounds. No wheezing or rales.  Abdominal:     General: Bowel sounds are normal. There is no distension.     Palpations: Abdomen is soft.     Tenderness: There is no abdominal tenderness. There is no rebound.  Skin:    General: Skin is warm and dry.  Neurological:     Mental Status: She is alert and oriented to person, place, and time.     Coordination: Coordination normal.     Vitals:   10/23/19 1548  BP: 120/70  Pulse: 75  Temp: 98.3 F (36.8 C)  TempSrc: Oral  SpO2: 99%  Weight: 116 lb (52.6 kg)  Height: 5\' 5"  (1.651 m)   This visit occurred during the SARS-CoV-2 public health emergency.  Safety protocols were in place, including screening questions prior to the visit, additional usage of staff PPE, and extensive cleaning of exam room while observing appropriate contact time as  indicated for disinfecting solutions.   Assessment & Plan:

## 2019-10-24 DIAGNOSIS — Z0000 Encounter for general adult medical examination without abnormal findings: Secondary | ICD-10-CM | POA: Insufficient documentation

## 2019-10-24 NOTE — Assessment & Plan Note (Signed)
Flu shot up to date. Shingrix declines. Tetanus up to date. Colonoscopy up to date. Mammogram up to date, pap smear up to date. Counseled about sun safety and mole surveillance. Counseled about the dangers of distracted driving. Given 10 year screening recommendations.

## 2019-10-24 NOTE — Assessment & Plan Note (Signed)
Using Edison. This does help improve QOL. Counseled on risk/beneft and she wishes to continue with therapy.

## 2019-10-24 NOTE — Assessment & Plan Note (Signed)
Doing well off prozac and is exercising. Rare xanax.

## 2019-10-24 NOTE — Assessment & Plan Note (Signed)
Checking lipid panel and adjust fish oil as needed.

## 2019-11-20 ENCOUNTER — Other Ambulatory Visit: Payer: Self-pay | Admitting: Internal Medicine

## 2019-11-20 NOTE — Telephone Encounter (Signed)
Control database checked last refill: Alprazolam 09/29/2019  60 tabs Zolpidem 09/29/2019  30 tabs  LOV:10/23/2019 cpe [id number]

## 2019-12-08 DIAGNOSIS — N760 Acute vaginitis: Secondary | ICD-10-CM | POA: Diagnosis not present

## 2019-12-13 ENCOUNTER — Other Ambulatory Visit: Payer: Self-pay

## 2019-12-13 ENCOUNTER — Ambulatory Visit: Payer: BC Managed Care – PPO | Attending: Internal Medicine

## 2019-12-13 DIAGNOSIS — Z23 Encounter for immunization: Secondary | ICD-10-CM | POA: Diagnosis not present

## 2020-01-02 ENCOUNTER — Ambulatory Visit: Payer: BC Managed Care – PPO

## 2020-01-03 ENCOUNTER — Ambulatory Visit: Payer: BC Managed Care – PPO

## 2020-01-07 ENCOUNTER — Encounter: Payer: Self-pay | Admitting: Internal Medicine

## 2020-01-26 ENCOUNTER — Encounter: Payer: Self-pay | Admitting: Internal Medicine

## 2020-01-27 NOTE — Telephone Encounter (Signed)
Called patient and asked to speak to [name], call disconnects.  Called a second time and asked to speak to [name] and the call was disconnected again. Will send a mychart message.

## 2020-01-28 ENCOUNTER — Encounter: Payer: Self-pay | Admitting: Internal Medicine

## 2020-01-29 ENCOUNTER — Ambulatory Visit (INDEPENDENT_AMBULATORY_CARE_PROVIDER_SITE_OTHER): Payer: No Typology Code available for payment source | Admitting: Internal Medicine

## 2020-01-29 ENCOUNTER — Encounter: Payer: Self-pay | Admitting: Internal Medicine

## 2020-01-29 DIAGNOSIS — F419 Anxiety disorder, unspecified: Secondary | ICD-10-CM | POA: Diagnosis not present

## 2020-01-29 MED ORDER — FLUOXETINE HCL 10 MG PO TABS: 10.0000 mg | ORAL_TABLET | Freq: Every day | ORAL | 3 refills | Status: DC

## 2020-01-29 NOTE — Progress Notes (Signed)
Virtual Visit via Audio Note  I connected with [name] on 01/29/20 at  4:00 PM EST by an audio-only enabled telemedicine application and verified that I am speaking with the correct person using two identifiers.  The patient and the provider were at separate locations throughout the entire encounter.   I discussed the limitations of evaluation and management by telemedicine and the availability of in person appointments. The patient expressed understanding and agreed to proceed. The patient and the provider were the only parties present for the visit unless noted in HPI below.  History of Present Illness: The patient is a 64 y.o. female with visit for depression and crying more often. Used to be on cymbalta and stopped this due to stomach problems last summer. She has been trying to cope with a life. New job is going well. Started a couple weeks ago. 26 some old cymbalta she had at home which did not help and caused her stomach problems so she took only 1 day. Has no SI/HI. Overall it is not improving.   Observations/Objective: Appearance: normal, breathing appears normal, casual grooming, abdomen does not appear distended, throat normal, memory normal, mental status is A and O times 3  Assessment and Plan: See problem oriented charting  Follow Up Instructions: rx prozac 10 mg daily, if not effective will try celexa  Visit time 6 minutes in non-face to face communication with patient and coordination of care.   I discussed the assessment and treatment plan with the patient. The patient was provided an opportunity to ask questions and all were answered. The patient agreed with the plan and demonstrated an understanding of the instructions.   The patient was advised to call back or seek an in-person evaluation if the symptoms worsen or if the condition fails to improve as anticipated.  Hoyt Koch, MD

## 2020-01-30 NOTE — Assessment & Plan Note (Signed)
Rx prozac 10 mg daily. Has xanax but using once per week or less.

## 2020-01-30 NOTE — Addendum Note (Signed)
Addended by: Hoyt Koch on: 01/30/2020 08:54 AM   Modules accepted: Level of Service

## 2020-02-03 DIAGNOSIS — Z9189 Other specified personal risk factors, not elsewhere classified: Secondary | ICD-10-CM | POA: Insufficient documentation

## 2020-05-03 ENCOUNTER — Encounter: Payer: Self-pay | Admitting: Internal Medicine

## 2020-05-04 MED ORDER — NITROFURANTOIN MONOHYD MACRO 100 MG PO CAPS: 100.0000 mg | ORAL_CAPSULE | Freq: Two times a day (BID) | ORAL | 0 refills | Status: DC

## 2020-05-05 ENCOUNTER — Encounter: Payer: Self-pay | Admitting: Internal Medicine

## 2020-05-06 MED ORDER — CEPHALEXIN 500 MG PO CAPS: 500.0000 mg | ORAL_CAPSULE | Freq: Two times a day (BID) | ORAL | 0 refills | Status: AC

## 2020-06-18 ENCOUNTER — Other Ambulatory Visit: Payer: Self-pay | Admitting: Internal Medicine

## 2020-09-07 ENCOUNTER — Other Ambulatory Visit: Payer: Self-pay | Admitting: Internal Medicine

## 2020-09-07 NOTE — Telephone Encounter (Signed)
Done erx 

## 2020-10-02 ENCOUNTER — Ambulatory Visit: Payer: No Typology Code available for payment source | Attending: Internal Medicine

## 2020-10-02 DIAGNOSIS — Z23 Encounter for immunization: Secondary | ICD-10-CM

## 2020-10-02 NOTE — Progress Notes (Signed)
° °  Covid-19 Vaccination Clinic  Name:  MAUREENA DABBS    MRN: [id number] DOB: 1956/11/21  10/02/2020  Ms. Fix was observed post Covid-19 immunization for 15 minutes without incident. She was provided with Vaccine Information Sheet and instruction to access the V-Safe system.   Ms. Driver was instructed to call 911 with any severe reactions post vaccine:  Difficulty breathing   Swelling of face and throat   A fast heartbeat   A bad rash all over body   Dizziness and weakness

## 2020-11-30 ENCOUNTER — Other Ambulatory Visit: Payer: Self-pay | Admitting: Internal Medicine

## 2020-11-30 NOTE — Telephone Encounter (Signed)
Ok forward to pcp 

## 2021-02-14 ENCOUNTER — Other Ambulatory Visit: Payer: Self-pay | Admitting: Internal Medicine

## 2021-02-23 ENCOUNTER — Other Ambulatory Visit: Payer: Self-pay | Admitting: Internal Medicine

## 2021-03-29 ENCOUNTER — Other Ambulatory Visit: Payer: Self-pay

## 2021-03-29 ENCOUNTER — Encounter: Payer: Self-pay | Admitting: Internal Medicine

## 2021-03-29 ENCOUNTER — Ambulatory Visit (INDEPENDENT_AMBULATORY_CARE_PROVIDER_SITE_OTHER): Payer: No Typology Code available for payment source | Admitting: Internal Medicine

## 2021-03-29 VITALS — BP 122/78 | HR 68 | Temp 98.3°F | Resp 18 | Ht 65.0 in | Wt 122.0 lb

## 2021-03-29 DIAGNOSIS — R309 Painful micturition, unspecified: Secondary | ICD-10-CM

## 2021-03-29 LAB — POCT URINALYSIS DIPSTICK
Bilirubin, UA: NEGATIVE
Glucose, UA: NEGATIVE
Ketones, UA: NEGATIVE
Nitrite, UA: NEGATIVE
Protein, UA: NEGATIVE
Spec Grav, UA: 1.015 (ref 1.010–1.025)
Urobilinogen, UA: 0.2 E.U./dL
pH, UA: 6 (ref 5.0–8.0)

## 2021-03-29 MED ORDER — CIPROFLOXACIN HCL 500 MG PO TABS: 500.0000 mg | ORAL_TABLET | Freq: Two times a day (BID) | ORAL | 0 refills | Status: AC

## 2021-03-29 MED ORDER — FLUCONAZOLE 150 MG PO TABS: 150.0000 mg | ORAL_TABLET | Freq: Once | ORAL | 0 refills | Status: AC

## 2021-03-29 MED ORDER — ZOLPIDEM TARTRATE ER 6.25 MG PO TBCR: 6.2500 mg | EXTENDED_RELEASE_TABLET | Freq: Every day | ORAL | 4 refills | Status: DC

## 2021-03-29 MED ORDER — ALPRAZOLAM 0.25 MG PO TABS: 0.2500 mg | ORAL_TABLET | Freq: Two times a day (BID) | ORAL | 5 refills | Status: AC | PRN

## 2021-03-29 NOTE — Progress Notes (Signed)
   Subjective:   Patient ID: [name], female    DOB: 1956/03/18, 65 y.o.   MRN: [id number]  HPI The patient is a 65 YO female coming in for concerns about back pain and urinary pain. She feels like she cannot void all the way. She has had kidney stone in the past and the back felt similar but not quite. She denies blood in urine which she has in the past with kidney stones. Denies fevers or chills. Denies appetite change and is still eating and drinking well.   Review of Systems  Constitutional: Negative.   Respiratory: Negative.   Cardiovascular: Negative.   Gastrointestinal: Negative for abdominal distention, abdominal pain, constipation, diarrhea, nausea and vomiting.  Genitourinary: Positive for dysuria, flank pain, frequency and urgency.  Skin: Negative.     Objective:  Physical Exam Constitutional:      Appearance: She is well-developed.  HENT:     Head: Normocephalic and atraumatic.  Cardiovascular:     Rate and Rhythm: Normal rate and regular rhythm.  Pulmonary:     Effort: Pulmonary effort is normal. No respiratory distress.     Breath sounds: Normal breath sounds. No wheezing or rales.  Abdominal:     General: Bowel sounds are normal. There is no distension.     Palpations: Abdomen is soft.     Tenderness: There is abdominal tenderness. There is no rebound.  Musculoskeletal:     Cervical back: Normal range of motion.  Skin:    General: Skin is warm and dry.  Neurological:     Mental Status: She is alert and oriented to person, place, and time.     Coordination: Coordination normal.     Vitals:   03/29/21 1524  BP: 122/78  Pulse: 68  Resp: 18  Temp: 98.3 F (36.8 C)  TempSrc: Oral  SpO2: 98%  Weight: 122 lb (55.3 kg)  Height: 5\' 5"  (1.651 m)    This visit occurred during the SARS-CoV-2 public health emergency.  Safety protocols were in place, including screening questions prior to the visit, additional usage of staff PPE, and extensive cleaning of  exam room while observing appropriate contact time as indicated for disinfecting solutions.   Assessment & Plan:

## 2021-03-29 NOTE — Patient Instructions (Signed)
We have sent in the ciprofloxacin to take 1 pill twice a day for 5 days.  We have sent in diflucan to take if needed for yeast.   Let us know if things worsen.

## 2021-04-01 ENCOUNTER — Encounter: Payer: Self-pay | Admitting: Internal Medicine

## 2021-04-01 DIAGNOSIS — R309 Painful micturition, unspecified: Secondary | ICD-10-CM | POA: Insufficient documentation

## 2021-04-01 NOTE — Assessment & Plan Note (Signed)
U/A done in office consistent with infection. Rx cipro and diflucan to cover for pyelonephritis given back/flank pain symptoms. If no resolution can assess for renal stone.

## 2021-07-19 ENCOUNTER — Other Ambulatory Visit: Payer: Self-pay

## 2021-07-19 ENCOUNTER — Encounter: Payer: Self-pay | Admitting: Internal Medicine

## 2021-07-19 ENCOUNTER — Ambulatory Visit (INDEPENDENT_AMBULATORY_CARE_PROVIDER_SITE_OTHER): Payer: No Typology Code available for payment source | Admitting: Internal Medicine

## 2021-07-19 VITALS — BP 112/80 | HR 74 | Temp 98.6°F | Resp 18 | Ht 65.0 in | Wt 121.6 lb

## 2021-07-19 DIAGNOSIS — E538 Deficiency of other specified B group vitamins: Secondary | ICD-10-CM

## 2021-07-19 DIAGNOSIS — Z0000 Encounter for general adult medical examination without abnormal findings: Secondary | ICD-10-CM

## 2021-07-19 DIAGNOSIS — F419 Anxiety disorder, unspecified: Secondary | ICD-10-CM

## 2021-07-19 DIAGNOSIS — L659 Nonscarring hair loss, unspecified: Secondary | ICD-10-CM

## 2021-07-19 DIAGNOSIS — R002 Palpitations: Secondary | ICD-10-CM | POA: Diagnosis not present

## 2021-07-19 DIAGNOSIS — E782 Mixed hyperlipidemia: Secondary | ICD-10-CM | POA: Diagnosis not present

## 2021-07-19 DIAGNOSIS — F5101 Primary insomnia: Secondary | ICD-10-CM

## 2021-07-19 LAB — COMPREHENSIVE METABOLIC PANEL
ALT: 11 U/L (ref 0–35)
AST: 17 U/L (ref 0–37)
Albumin: 4.6 g/dL (ref 3.5–5.2)
Alkaline Phosphatase: 88 U/L (ref 39–117)
BUN: 16 mg/dL (ref 6–23)
CO2: 28 mEq/L (ref 19–32)
Calcium: 10.1 mg/dL (ref 8.4–10.5)
Chloride: 104 mEq/L (ref 96–112)
Creatinine, Ser: 0.82 mg/dL (ref 0.40–1.20)
GFR: 75.09 mL/min (ref >60.00)
Glucose, Bld: 96 mg/dL (ref 70–99)
Potassium: 4.2 mEq/L (ref 3.5–5.1)
Sodium: 140 mEq/L (ref 135–145)
Total Bilirubin: 1.6 mg/dL — ABNORMAL HIGH (ref 0.2–1.2)
Total Protein: 7.3 g/dL (ref 6.0–8.3)

## 2021-07-19 LAB — VITAMIN B12: Vitamin B-12: 218 pg/mL (ref 211–911)

## 2021-07-19 LAB — CBC
HCT: 42.6 % (ref 36.0–46.0)
Hemoglobin: 14.6 g/dL (ref 12.0–15.0)
MCHC: 34.3 g/dL (ref 30.0–36.0)
MCV: 86.9 fl (ref 78.0–100.0)
Platelets: 321 10*3/uL (ref 150.0–400.0)
RBC: 4.9 Mil/uL (ref 3.87–5.11)
RDW: 13.5 % (ref 11.5–15.5)
WBC: 6 10*3/uL (ref 4.0–10.5)

## 2021-07-19 LAB — T4, FREE: Free T4: 0.79 ng/dL (ref 0.60–1.60)

## 2021-07-19 LAB — VITAMIN D 25 HYDROXY (VIT D DEFICIENCY, FRACTURES): VITD: 35.29 ng/mL (ref 30.00–100.00)

## 2021-07-19 LAB — LIPID PANEL
Cholesterol: 219 mg/dL — ABNORMAL HIGH (ref 0–200)
HDL: 55.4 mg/dL (ref >39.00)
LDL Cholesterol: 150 mg/dL — ABNORMAL HIGH (ref 0–99)
NonHDL: 163.48
Total CHOL/HDL Ratio: 4
Triglycerides: 68 mg/dL (ref 0.0–149.0)
VLDL: 13.6 mg/dL (ref 0.0–40.0)

## 2021-07-19 LAB — TSH: TSH: 2.86 u[IU]/mL (ref 0.35–5.50)

## 2021-07-19 NOTE — Patient Instructions (Signed)
We have done the EKG and will do the labs today.

## 2021-07-19 NOTE — Progress Notes (Signed)
   Subjective:   Patient ID: [name], female    DOB: Dec 01, 1956, 65 y.o.   MRN: [id number]  HPI The patient is a 65 YO female coming in for physical with other concerns.  HPI #2: Here for worsening palpitations (not sure if these are associated with anxiety, they start and the anxiety starts around the same time, mostly when not distracted) and hair loss (had episode about 3 years ago due to hormonal changes, family history of thyroid and she is concerned about that, she is also having fatigue and constipation).   PMH, [facility], social history reviewed and updated  Review of Systems  Constitutional: Negative.   HENT: Negative.         Hair loss  Eyes: Negative.   Respiratory:  Negative for cough, chest tightness and shortness of breath.   Cardiovascular:  Positive for palpitations. Negative for chest pain and leg swelling.  Gastrointestinal:  Negative for abdominal distention, abdominal pain, constipation, diarrhea, nausea and vomiting.  Musculoskeletal: Negative.   Skin: Negative.   Neurological: Negative.   Psychiatric/Behavioral:  The patient is nervous/anxious.    Objective:  Physical Exam Constitutional:      Appearance: She is well-developed.  HENT:     Head: Normocephalic and atraumatic.     Comments: Hair with some thinning, no alopecia patches or rash on the scalp Cardiovascular:     Rate and Rhythm: Normal rate and regular rhythm.  Pulmonary:     Effort: Pulmonary effort is normal. No respiratory distress.     Breath sounds: Normal breath sounds. No wheezing or rales.  Abdominal:     General: Bowel sounds are normal. There is no distension.     Palpations: Abdomen is soft.     Tenderness: There is no abdominal tenderness. There is no rebound.  Musculoskeletal:     Cervical back: Normal range of motion.  Skin:    General: Skin is warm and dry.  Neurological:     Mental Status: She is alert and oriented to person, place, and time.     Coordination: Coordination  normal.    Vitals:   07/19/21 0803  BP: 112/80  Pulse: 74  Resp: 18  Temp: 98.6 F (37 C)  TempSrc: Oral  SpO2: 99%  Weight: 121 lb 9.6 oz (55.2 kg)  Height: '5\' 5"'$  (1.651 m)   EKG: Rate 65, axis normal, interval normal, sinus, no st or t wave changes, no significant change compared to prior 2020   This visit occurred during the SARS-CoV-2 public health emergency.  Safety protocols were in place, including screening questions prior to the visit, additional usage of staff PPE, and extensive cleaning of exam room while observing appropriate contact time as indicated for disinfecting solutions.   Assessment & Plan:

## 2021-07-19 NOTE — Assessment & Plan Note (Signed)
Flu shot yearly. Covid-19 3 shots counseled. Shingrix declines did not previously have chicken pox, discussed possible chicken pox vaccine. Tetanus due declines. Colonoscopy up to date. Mammogram due she will schedule, pap smear up to date with gyn and dexa up to date with gyn. Counseled about sun safety and mole surveillance. Counseled about the dangers of distracted driving. Given 10 year screening recommendations.

## 2021-07-19 NOTE — Assessment & Plan Note (Signed)
She has stopped alprazolam and zolpidem which she used rarely previous. With more anxiety than usual more so outside of work. We talked about coping mechanisms.

## 2021-07-19 NOTE — Assessment & Plan Note (Signed)
Checking lipid panel and adjust as needed.  

## 2021-07-19 NOTE — Assessment & Plan Note (Signed)
Has stopped ambien for now and may wish to resume in the future. Can refill if needed.

## 2021-07-19 NOTE — Assessment & Plan Note (Signed)
Checking TSH and free T4 as well as B12 and vitamin D to assess for causes.

## 2021-07-19 NOTE — Assessment & Plan Note (Signed)
Checking B12 level today.  

## 2021-07-19 NOTE — Assessment & Plan Note (Signed)
EKG done today which was normal and unchanged from prior 2020. We are checking labs to assess for underlying anemia, vitamin deficiency and thyroid condition. If these are negative we can consider holter monitoring to assess for arhythmia.

## 2021-08-22 ENCOUNTER — Other Ambulatory Visit (HOSPITAL_COMMUNITY): Payer: Self-pay

## 2021-08-22 MED ORDER — VALACYCLOVIR HCL 1 G PO TABS
ORAL_TABLET | ORAL | 0 refills | Status: DC
  Filled 2021-08-22: qty 30, 30d supply, fill #0

## 2021-08-23 ENCOUNTER — Other Ambulatory Visit (HOSPITAL_COMMUNITY): Payer: Self-pay

## 2021-10-03 ENCOUNTER — Other Ambulatory Visit: Payer: Self-pay | Admitting: Internal Medicine

## 2022-02-03 ENCOUNTER — Other Ambulatory Visit: Payer: Self-pay

## 2022-02-03 ENCOUNTER — Telehealth (INDEPENDENT_AMBULATORY_CARE_PROVIDER_SITE_OTHER): Payer: No Typology Code available for payment source | Admitting: Nurse Practitioner

## 2022-02-03 VITALS — Temp 100.4°F

## 2022-02-03 DIAGNOSIS — U071 COVID-19: Secondary | ICD-10-CM | POA: Diagnosis not present

## 2022-02-03 DIAGNOSIS — R110 Nausea: Secondary | ICD-10-CM | POA: Diagnosis not present

## 2022-02-03 MED ORDER — NIRMATRELVIR/RITONAVIR (PAXLOVID)TABLET
3.0000 | ORAL_TABLET | Freq: Two times a day (BID) | ORAL | 0 refills | Status: AC
Start: 2022-02-03 — End: 2022-02-08

## 2022-02-03 MED ORDER — SUCRALFATE 1 G PO TABS: 1.0000 g | ORAL_TABLET | Freq: Three times a day (TID) | ORAL | 1 refills | Status: DC | PRN

## 2022-02-03 NOTE — Progress Notes (Signed)
? ?An audio-only tele-health visit was completed today for this patient. I connected with  [name] on 02/03/22 utilizing audio-only technology and verified that I am speaking with the correct person using two identifiers. The patient was located at their home, and I was located at the office of [place] at [facility] during the encounter. I discussed the limitations of evaluation and management by telemedicine. The patient expressed understanding and agreed to proceed.  ? ? ? ?Subjective:  ?Patient ID: [name], female    DOB: Apr 18, 1956  Age: 66 y.o. MRN: [id number] ? ?CC:  ?Chief Complaint  ?Patient presents with  ? Covid Positive  ?  ? ? ?HPI  ?This patient arrives today for the above. ? ?Symptoms initially started 3 days ago.  She is experiencing intermittent low-grade fever up to 100.4, fatigue, cough, nausea, and diarrhea.  She denies shortness of breath.  She has no sputum production with her cough.  She been trying to do deep breathing exercises as well as pronation at home.  She has been taking an old prescription of sucralfate that she has from previous hospitalization with improvement in her symptoms. ? ?Past Medical History:  ?Diagnosis Date  ? Acid reflux   ? Cancer [facility])   ? precancerous cervix area  ? Depression   ? Hyperlipidemia   ? ? ? ? ?Family History  ?Problem Relation Age of Onset  ? Stroke Father 57  ?     CVA multiple  ? Heart disease Father   ? Emphysema Mother   ? Cancer Sister   ?     BREAST CANCER  ? Alzheimer's disease Brother   ? Diabetes Brother   ? Stroke Brother   ? Cancer Sister   ?     BREAST CANCER  ? Breast cancer Maternal Aunt   ? Colon cancer Neg Hx   ? ? ?Social History  ? ?Social History Narrative  ? Not on file  ? ?Social History  ? ?Tobacco Use  ? Smoking status: Former  ?  Packs/day: 0.30  ?  Years: 4.00  ?  Pack years: 1.20  ?  Types: Cigarettes  ?  Quit date: 12/04/1992  ?  Years since quitting: 29.1  ? Smokeless tobacco: Never  ?Substance Use  Topics  ? Alcohol use: No  ?  Alcohol/week: 0.0 standard drinks  ?  Comment: quit 2.5 yrs ago  ? ? ? ?Current Meds  ?Medication Sig  ? ALPRAZolam (XANAX) 0.25 MG tablet Take 1 tablet (0.25 mg total) by mouth 2 (two) times daily as needed for anxiety.  ? Biotin 5 MG CAPS Take 1 capsule by mouth daily.  ? Calcium Citrate-Vitamin D 250-200 MG-UNIT TABS Take 1 tablet by mouth 2 (two) times daily.  ? Estradiol 4 MCG INST Imvexxy Maintenance Pack 4 mcg vaginal insert  ? Glucosamine Sulfate 1000 MG CAPS Take 1 capsule by mouth daily.   ? nirmatrelvir/ritonavir EUA (PAXLOVID) 20 x 150 MG & 10 x 100MG  TABS Take 3 tablets by mouth 2 (two) times daily for 5 days. (Take nirmatrelvir 150 mg two tablets twice daily for 5 days and ritonavir 100 mg one tablet twice daily for 5 days) Patient GFR is 75  ? Omega-3 Fatty Acids (FISH OIL PO) Take 2,000 mg by mouth 2 (two) times daily.  ? omeprazole (PRILOSEC) 20 MG capsule Take 20 mg by mouth 2 (two) times daily as needed.  ? sucralfate (CARAFATE) 1 g tablet Take 1  tablet (1 g total) by mouth 3 (three) times daily as needed.  ? valACYclovir (VALTREX) 1000 MG tablet Take 1 tablet by mouth twice a day for 3 days for each flare.  ? zolpidem (AMBIEN CR) 6.25 MG CR tablet Take 1 tablet (6.25 mg total) by mouth at bedtime.  ? ? ?ROS:  ?Review of Systems  ?Constitutional:  Positive for fever and malaise/fatigue.  ?Respiratory:  Positive for cough. Negative for sputum production and shortness of breath.   ?Cardiovascular:  Negative for chest pain.  ?Gastrointestinal:  Positive for diarrhea and nausea. Negative for vomiting.  ?     (+) anorexia  ?Neurological:  Positive for headaches.  ? ? ?Objective:  ? ?Today's Vitals: Temp (!) 100.4 ?F (38 ?C)  ?Vitals with BMI 07/19/2021 03/29/2021 10/23/2019  ?Height 5\' 5"  5\' 5"  5\' 5"   ?Weight 121 lbs 10 oz 122 lbs 116 lbs  ?BMI 20.24 20.3 19.3  ?Systolic 947 096 283  ?Diastolic 80 78 70  ?Pulse 74 68 75  ?  ? ?Physical Exam ?Comprehensive physical exam not  completed today as office visit was conducted remotely.  Patient sounds well over the phone, she did cough a couple of times during visit but was able to speak in full sentences and did not appear to be short of breath or in respiratory distress.  Patient was alert and oriented, and appeared to have appropriate judgment. ? ? ? ? ? ? ?Assessment and Plan  ? ?1. COVID-19 virus infection   ?2. Nausea   ? ? ? ?Plan: ?1.,  2.  We will treat with Paxlovid and sucralfate for her upset stomach.  She was encouraged to proceed to the emergency department if symptoms worsen especially starts to be experiencing high fevers, shortness of breath, or increased sputum production with cough.  She reports her understanding. ? ?Tests ordered ?No orders of the defined types were placed in this encounter. ? ? ? ? ?Meds ordered this encounter  ?Medications  ? sucralfate (CARAFATE) 1 g tablet  ?  Sig: Take 1 tablet (1 g total) by mouth 3 (three) times daily as needed.  ?  Dispense:  30 tablet  ?  Refill:  1  ?  Order Specific Question:   Supervising Provider  ?  Answer:   Binnie Rail [[id number]]  ? nirmatrelvir/ritonavir EUA (PAXLOVID) 20 x 150 MG & 10 x 100MG  TABS  ?  Sig: Take 3 tablets by mouth 2 (two) times daily for 5 days. (Take nirmatrelvir 150 mg two tablets twice daily for 5 days and ritonavir 100 mg one tablet twice daily for 5 days) Patient GFR is 75  ?  Dispense:  30 tablet  ?  Refill:  0  ?  Order Specific Question:   Supervising Provider  ?  Answer:   Binnie Rail [[id number]]  ? ? ?Patient to follow-up if symptoms worsen or do not improve.  Total time spent on visit today with patient was 18 minutes. ? ?Ailene Ards, NP ? ?

## 2022-04-18 ENCOUNTER — Encounter: Payer: Self-pay | Admitting: Internal Medicine

## 2022-04-18 ENCOUNTER — Ambulatory Visit (INDEPENDENT_AMBULATORY_CARE_PROVIDER_SITE_OTHER): Payer: Medicare Other

## 2022-04-18 ENCOUNTER — Ambulatory Visit (INDEPENDENT_AMBULATORY_CARE_PROVIDER_SITE_OTHER): Payer: Self-pay | Admitting: Internal Medicine

## 2022-04-18 VITALS — BP 126/80 | HR 89 | Resp 18 | Ht 65.0 in | Wt 124.6 lb

## 2022-04-18 DIAGNOSIS — R052 Subacute cough: Secondary | ICD-10-CM

## 2022-04-18 DIAGNOSIS — R059 Cough, unspecified: Secondary | ICD-10-CM | POA: Diagnosis not present

## 2022-04-18 MED ORDER — ZOLPIDEM TARTRATE ER 6.25 MG PO TBCR: 6.2500 mg | EXTENDED_RELEASE_TABLET | Freq: Every day | ORAL | 4 refills | Status: DC

## 2022-04-18 MED ORDER — BENZONATATE 200 MG PO CAPS: 200.0000 mg | ORAL_CAPSULE | Freq: Three times a day (TID) | ORAL | 0 refills | Status: DC | PRN

## 2022-04-18 MED ORDER — PROMETHAZINE-DM 6.25-15 MG/5ML PO SYRP: 5.0000 mL | ORAL_SOLUTION | Freq: Four times a day (QID) | ORAL | 0 refills | Status: DC | PRN

## 2022-04-18 NOTE — Patient Instructions (Signed)
We have sent in the ambien and the tessalon perles (use up to 3 times a day) and the promethazine cough syrup to use at night time. ? ? ?

## 2022-04-18 NOTE — Progress Notes (Signed)
   Subjective:   Patient ID: [name], female    DOB: 09-Nov-1956, 66 y.o.   MRN: [id number]  HPI The patient is a 66 YO female coming in for cough ongoing after covid for 2 months now. No SOB, just incessant cough.   Review of Systems  Constitutional: Negative.   HENT: Negative.    Eyes: Negative.   Respiratory:  Positive for cough. Negative for chest tightness and shortness of breath.   Cardiovascular:  Negative for chest pain, palpitations and leg swelling.  Gastrointestinal:  Negative for abdominal distention, abdominal pain, constipation, diarrhea, nausea and vomiting.  Musculoskeletal: Negative.   Skin: Negative.   Neurological: Negative.   Psychiatric/Behavioral: Negative.     Objective:  Physical Exam Constitutional:      Appearance: She is well-developed.  HENT:     Head: Normocephalic and atraumatic.  Cardiovascular:     Rate and Rhythm: Normal rate and regular rhythm.  Pulmonary:     Effort: Pulmonary effort is normal. No respiratory distress.     Breath sounds: Normal breath sounds. No wheezing or rales.  Abdominal:     General: Bowel sounds are normal. There is no distension.     Palpations: Abdomen is soft.     Tenderness: There is no abdominal tenderness. There is no rebound.  Musculoskeletal:     Cervical back: Normal range of motion.  Skin:    General: Skin is warm and dry.  Neurological:     Mental Status: She is alert and oriented to person, place, and time.     Coordination: Coordination normal.    Vitals:   04/18/22 1545  BP: 126/80  Pulse: 89  Resp: 18  SpO2: 98%  Weight: 124 lb 9.6 oz (56.5 kg)  Height: '5\' 5"'$  (1.651 m)    Assessment & Plan:

## 2022-04-20 DIAGNOSIS — R059 Cough, unspecified: Secondary | ICD-10-CM | POA: Insufficient documentation

## 2022-04-20 DIAGNOSIS — R052 Subacute cough: Secondary | ICD-10-CM | POA: Insufficient documentation

## 2022-04-20 NOTE — Assessment & Plan Note (Signed)
Suspect this could be covid-19 related and talked about how long covid-19 starts after 3 months of symptoms. Rx tessalon perles and promethazine/dm and will try to suppress cough to help minimize inflammation and hopefully stop cough. Ordered CXR to rule out secondary pneumonia.

## 2022-04-26 ENCOUNTER — Encounter: Payer: Self-pay | Admitting: Internal Medicine

## 2022-05-05 ENCOUNTER — Encounter: Payer: Self-pay | Admitting: Internal Medicine

## 2022-05-05 MED ORDER — AZITHROMYCIN 250 MG PO TABS: ORAL_TABLET | ORAL | 1 refills | Status: AC

## 2022-05-05 MED ORDER — MECLIZINE HCL 12.5 MG PO TABS: 12.5000 mg | ORAL_TABLET | Freq: Three times a day (TID) | ORAL | 1 refills | Status: AC | PRN

## 2022-05-16 ENCOUNTER — Other Ambulatory Visit (HOSPITAL_COMMUNITY): Payer: Self-pay

## 2022-05-18 ENCOUNTER — Other Ambulatory Visit (HOSPITAL_COMMUNITY): Payer: Self-pay

## 2022-05-18 MED ORDER — VALACYCLOVIR HCL 1 G PO TABS
ORAL_TABLET | ORAL | 3 refills | Status: AC
  Filled 2022-05-18: qty 30, 15d supply, fill #0

## 2022-05-22 ENCOUNTER — Other Ambulatory Visit (HOSPITAL_COMMUNITY): Payer: Self-pay

## 2022-07-27 DIAGNOSIS — Z6821 Body mass index (BMI) 21.0-21.9, adult: Secondary | ICD-10-CM | POA: Diagnosis not present

## 2022-07-29 DIAGNOSIS — Z1231 Encounter for screening mammogram for malignant neoplasm of breast: Secondary | ICD-10-CM | POA: Diagnosis not present

## 2022-08-25 ENCOUNTER — Encounter: Payer: Self-pay | Admitting: Internal Medicine

## 2022-08-25 DIAGNOSIS — R42 Dizziness and giddiness: Secondary | ICD-10-CM

## 2022-08-25 DIAGNOSIS — J3489 Other specified disorders of nose and nasal sinuses: Secondary | ICD-10-CM

## 2022-08-26 ENCOUNTER — Encounter: Payer: Self-pay | Admitting: Internal Medicine

## 2022-08-30 ENCOUNTER — Telehealth: Payer: Medicare Other | Admitting: Physician Assistant

## 2022-08-30 DIAGNOSIS — J019 Acute sinusitis, unspecified: Secondary | ICD-10-CM

## 2022-08-30 DIAGNOSIS — B9689 Other specified bacterial agents as the cause of diseases classified elsewhere: Secondary | ICD-10-CM

## 2022-08-30 MED ORDER — FLUTICASONE PROPIONATE 50 MCG/ACT NA SUSP: 2.0000 | Freq: Every day | NASAL | 0 refills | Status: AC

## 2022-08-30 MED ORDER — DOXYCYCLINE HYCLATE 100 MG PO TABS: 100.0000 mg | ORAL_TABLET | Freq: Two times a day (BID) | ORAL | 0 refills | Status: DC

## 2022-08-30 MED ORDER — AMOXICILLIN-POT CLAVULANATE 875-125 MG PO TABS: 1.0000 | ORAL_TABLET | Freq: Two times a day (BID) | ORAL | 0 refills | Status: DC

## 2022-08-30 MED ORDER — FLUCONAZOLE 150 MG PO TABS: ORAL_TABLET | ORAL | 0 refills | Status: DC

## 2022-08-30 NOTE — Progress Notes (Signed)
E-Visit for Sinus Problems  We are sorry that you are not feeling well.  Here is how we plan to help!  Based on what you have shared with me it looks like you have sinusitis.  Sinusitis is inflammation and infection in the sinus cavities of the head.  Based on your presentation I believe you most likely have Acute Bacterial Sinusitis.  This is an infection caused by bacteria and is treated with antibiotics. I have prescribed Augmentin '875mg'$ /'125mg'$  one tablet twice daily with food, for 7 days. I have sent in a prescription nasal spray to use once daily and I have put a script on file for Diflucan just in case of a yeast infection from antibiotics.   You may use an oral decongestant such as Mucinex D or if you have glaucoma or high blood pressure use plain Mucinex. Saline nasal spray help and can safely be used as often as needed for congestion.  If you develop worsening sinus pain, fever or notice severe headache and vision changes, or if symptoms are not better after completion of antibiotic, please schedule an appointment with a health care provider.    Sinus infections are not as easily transmitted as other respiratory infection, however we still recommend that you avoid close contact with loved ones, especially the very young and elderly.  Remember to wash your hands thoroughly throughout the day as this is the number one way to prevent the spread of infection!  Home Care: Only take medications as instructed by your medical team. Complete the entire course of an antibiotic. Do not take these medications with alcohol. A steam or ultrasonic humidifier can help congestion.  You can place a towel over your head and breathe in the steam from hot water coming from a faucet. Avoid close contacts especially the very young and the elderly. Cover your mouth when you cough or sneeze. Always remember to wash your hands.  Get Help Right Away If: You develop worsening fever or sinus pain. You develop a  severe head ache or visual changes. Your symptoms persist after you have completed your treatment plan.  Make sure you Understand these instructions. Will watch your condition. Will get help right away if you are not doing well or get worse.  Thank you for choosing an e-visit.  Your e-visit answers were reviewed by a board certified advanced clinical practitioner to complete your personal care plan. Depending upon the condition, your plan could have included both over the counter or prescription medications.  Please review your pharmacy choice. Make sure the pharmacy is open so you can pick up prescription now. If there is a problem, you may contact your provider through [employer] and have the prescription routed to another pharmacy.  Your safety is important to [place]. If you have drug allergies check your prescription carefully.   For the next 24 hours you can use MyChart to ask questions about today's visit, request a non-urgent call back, or ask for a work or school excuse. You will get an email in the next two days asking about your experience. I hope that your e-visit has been valuable and will speed your recovery.

## 2022-08-30 NOTE — Progress Notes (Signed)
I have spent 5 minutes in review of e-visit questionnaire, review and updating patient chart, medical decision making and response to patient.   Bonnye Halle Cody Ernst [place], PA-C    

## 2022-08-30 NOTE — Addendum Note (Signed)
Addended by: Brunetta Jeans on: 08/30/2022 07:19 PM   Modules accepted: Orders

## 2022-09-11 NOTE — Telephone Encounter (Signed)
Called pt and left an voicemail.

## 2022-09-13 ENCOUNTER — Ambulatory Visit (INDEPENDENT_AMBULATORY_CARE_PROVIDER_SITE_OTHER): Payer: Medicare Other | Admitting: Family Medicine

## 2022-09-13 ENCOUNTER — Encounter: Payer: Self-pay | Admitting: Family Medicine

## 2022-09-13 VITALS — Temp 98.3°F | Ht 65.0 in | Wt 120.5 lb

## 2022-09-13 DIAGNOSIS — R42 Dizziness and giddiness: Secondary | ICD-10-CM | POA: Diagnosis not present

## 2022-09-13 DIAGNOSIS — F411 Generalized anxiety disorder: Secondary | ICD-10-CM | POA: Insufficient documentation

## 2022-09-13 DIAGNOSIS — J019 Acute sinusitis, unspecified: Secondary | ICD-10-CM | POA: Insufficient documentation

## 2022-09-13 DIAGNOSIS — M8580 Other specified disorders of bone density and structure, unspecified site: Secondary | ICD-10-CM | POA: Insufficient documentation

## 2022-09-13 DIAGNOSIS — M65319 Trigger thumb, unspecified thumb: Secondary | ICD-10-CM | POA: Insufficient documentation

## 2022-09-13 DIAGNOSIS — J3489 Other specified disorders of nose and nasal sinuses: Secondary | ICD-10-CM

## 2022-09-13 DIAGNOSIS — G5600 Carpal tunnel syndrome, unspecified upper limb: Secondary | ICD-10-CM | POA: Insufficient documentation

## 2022-09-13 MED ORDER — CLINDAMYCIN HCL 300 MG PO CAPS: 300.0000 mg | ORAL_CAPSULE | Freq: Three times a day (TID) | ORAL | 0 refills | Status: DC

## 2022-09-13 MED ORDER — FLUCONAZOLE 150 MG PO TABS: 150.0000 mg | ORAL_TABLET | Freq: Once | ORAL | 0 refills | Status: AC

## 2022-09-13 MED ORDER — METHYLPREDNISOLONE ACETATE 80 MG/ML IJ SUSP
80.0000 mg | Freq: Once | INTRAMUSCULAR | Status: AC
  Administered 2022-09-13: 80 mg via INTRAMUSCULAR

## 2022-09-13 NOTE — Patient Instructions (Signed)
You received a steroid injection today in our office called Depo-Medrol 80 mg IM.  Take the antibiotic, clindamycin, as prescribed and take a probiotic for the next 2 weeks.  Stay well-hydrated.  Continue using Flonase and take Sudafed if you tolerate this.  Take meclizine for your vertigo.  We will try to expedite your ENT appointment.  Follow-up if you are getting worse

## 2022-09-13 NOTE — Progress Notes (Signed)
Subjective:     Patient ID: [name], female    DOB: [birth year], 66 y.o.   MRN: [id number]  Chief Complaint  Patient presents with   Sinus Problem    Been going on for about three weeks which is affecting her vertigo, some drainage with ( pale yellow and sometimes clear in colour     HPI Patient is in today for sinus pressure and severe nasal congestion. States her vertigo has been flared up. Meclizine helps her vertigo but she cannot take it and go to work due to drowsiness.  Having mild post nasal drainage. No cough.  Using Flonase.   She has completed a course of Doxycycline. She was started on Augmentin but it made her very sick.   Awaiting appt with ENT. She has been referred.   No fever, chills, chest pain, palpitations, shortness of breath, N/V/D.  No focal weakness.   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   Depression    Hyperlipidemia     Past Surgical History:  Procedure Laterality Date   APPENDECTOMY     BREAST ENHANCEMENT SURGERY     CYSTOSCOPY WITH RETROGRADE PYELOGRAM, URETEROSCOPY AND STENT PLACEMENT Right 10/17/2016   Procedure: CYSTOSCOPY WITH RETROGRADE PYELOGRAM, URETEROSCOPY LASER LITHOTRIPSY AND STENT PLACEMENT;  Surgeon: Ardis Hughs, MD;  Location: [facility];  Service: Urology;  Laterality: Right;   DILATION AND CURETTAGE OF UTERUS     maxillofacial surgery     for TMJ   TONSILLECTOMY     UPPER GASTROINTESTINAL ENDOSCOPY  2002   Dr Michail Sermon    Family History  Problem Relation Age of Onset   Stroke Father 80       CVA multiple   Heart disease Father    Emphysema Mother    Cancer Sister        BREAST CANCER   Alzheimer's disease Brother    Diabetes Brother    Stroke Brother     Cancer Sister        BREAST CANCER   Breast cancer Maternal Aunt    Colon cancer Neg Hx     Social History   Socioeconomic History   Marital status: Divorced    Spouse name: Not on file   Number of children: 0   Years of education: Not on file   Highest education level: Not on file  Occupational History   Occupation: retired  Tobacco Use   Smoking status: Former    Packs/day: 0.30    Years: 4.00    Total pack years: 1.20    Types: Cigarettes    Quit date: 12/04/1992    Years since quitting: 29.7   Smokeless tobacco: Never  Substance and Sexual Activity   Alcohol use: No    Alcohol/week: 0.0 standard drinks of alcohol    Comment: quit 2.5 yrs ago   Drug use: No   Sexual activity: Not Currently    Comment: divorce  Other Topics Concern   Not on file  Social History Narrative   Not on file   Social Determinants of Health   Financial Resource Strain: Not on file  Food Insecurity: Not on file  Transportation  Needs: Not on file  Physical Activity: Not on file  Stress: Not on file  Social Connections: Not on file  Intimate Partner Violence: Not on file    Outpatient Medications Prior to Visit  Medication Sig Dispense Refill   ALPRAZolam (XANAX) 0.25 MG tablet Take 1 tablet (0.25 mg total) by mouth 2 (two) times daily as needed for anxiety. 60 tablet 5   benzonatate (TESSALON) 200 MG capsule Take 1 capsule (200 mg total) by mouth 3 (three) times daily as needed. 60 capsule 0   Biotin 5 MG CAPS Take 1 capsule by mouth daily.     Calcium Citrate-Vitamin D 250-200 MG-UNIT TABS Take 1 tablet by mouth 2 (two) times daily.     Estradiol 4 MCG INST Imvexxy Maintenance Pack 4 mcg vaginal insert     fluticasone (FLONASE) 50 MCG/ACT nasal spray Place 2 sprays into both nostrils daily. 16 g 0   Glucosamine Sulfate 1000 MG CAPS Take 1 capsule by mouth daily.      meclizine (ANTIVERT) 12.5 MG tablet Take 1 tablet (12.5 mg total) by mouth 3 (three) times daily as needed for  dizziness. 30 tablet 1   Omega-3 Fatty Acids (FISH OIL PO) Take 2,000 mg by mouth 2 (two) times daily.     omeprazole (PRILOSEC) 20 MG capsule Take 20 mg by mouth 2 (two) times daily as needed.     promethazine-dextromethorphan (PROMETHAZINE-DM) 6.25-15 MG/5ML syrup Take 5 mLs by mouth 4 (four) times daily as needed. 118 mL 0   valACYclovir (VALTREX) 1000 MG tablet Take 1 tablet by mouth twice a day for 3 days for each flare. 30 tablet 0   valACYclovir (VALTREX) 1000 MG tablet Take 1 tablet by mouth twice a day for 3 days then as needed 30 tablet 3   zolpidem (AMBIEN CR) 6.25 MG CR tablet Take 1 tablet (6.25 mg total) by mouth at bedtime. 30 tablet 4   doxycycline (VIBRA-TABS) 100 MG tablet Take 1 tablet (100 mg total) by mouth 2 (two) times daily. 20 tablet 0   fluconazole (DIFLUCAN) 150 MG tablet Take 1 tablet PO once. Repeat in 3 days if needed. 2 tablet 0   No facility-administered medications prior to visit.    Allergies  Allergen Reactions   Augmentin [Amoxicillin-Pot Clavulanate] Nausea And Vomiting   Codeine Phosphate Nausea And Vomiting   Darvon [Propoxyphene] Nausea And Vomiting   Oxycodone-Acetaminophen Nausea And Vomiting   Propoxyphene N-Acetaminophen Nausea And Vomiting   Sulfamethoxazole Nausea And Vomiting    ROS     Objective:    Physical Exam Constitutional:      General: She is not in acute distress.    Appearance: She is not ill-appearing.  HENT:     Right Ear: Tympanic membrane and ear canal normal.     Left Ear: Tympanic membrane and ear canal normal.     Nose: Congestion present.     Mouth/Throat:     Mouth: Mucous membranes are moist.     Pharynx: Posterior oropharyngeal erythema present.  Eyes:     Extraocular Movements: Extraocular movements intact.     Conjunctiva/sclera: Conjunctivae normal.     Pupils: Pupils are equal, round, and reactive to light.  Cardiovascular:     Rate and Rhythm: Normal rate and regular rhythm.  Pulmonary:     Effort:  Pulmonary effort is normal.     Breath sounds: Normal breath sounds.  Musculoskeletal:        General: Normal range of motion.  Cervical back: Normal range of motion and neck supple.  Lymphadenopathy:     Cervical: No cervical adenopathy.  Skin:    General: Skin is warm and dry.  Neurological:     General: No focal deficit present.     Mental Status: She is alert and oriented to person, place, and time.     Cranial Nerves: No cranial nerve deficit.     Sensory: No sensory deficit.     Motor: No weakness.     Coordination: Coordination normal.     Gait: Gait normal.  Psychiatric:        Mood and Affect: Mood normal.        Behavior: Behavior normal.        Thought Content: Thought content normal.     Temp 98.3 F (36.8 C) (Oral)   Ht '5\' 5"'$  (1.651 m)   Wt 120 lb 8 oz (54.7 kg)   SpO2 99%   BMI 20.05 kg/m  Wt Readings from Last 3 Encounters:  09/13/22 120 lb 8 oz (54.7 kg)  04/18/22 124 lb 9.6 oz (56.5 kg)  07/19/21 121 lb 9.6 oz (55.2 kg)    She is not orthostatic.      Assessment & Plan:   Problem List Items Addressed This Visit       Respiratory   Acute sinusitis with symptoms > 10 days - Primary   Relevant Medications   clindamycin (CLEOCIN) 300 MG capsule     Other   Sinus pain   Relevant Medications   clindamycin (CLEOCIN) 300 MG capsule   Other Visit Diagnoses     Vertigo          Depo-medrol 80 mg IM injection given in office.  Clindamycin prescribed. Does not tolerate Augmentin and sulfa. No improvement with Doxycycline.  Continue Flonase. May try Afrin x 1-2 days. Take Sudafed.  Continue meclizine prn vertigo. May need PT for vertigo if not improving.  Discussed CT of sinuses but will wait on ENT referral which we will attempt to expedite.   I have discontinued Maureen Ralphs. Wisor's fluconazole and doxycycline. I am also having her start on clindamycin and fluconazole. Additionally, I am having her maintain her Biotin, Calcium Citrate-Vitamin  D, Omega-3 Fatty Acids (FISH OIL PO), Glucosamine Sulfate, omeprazole, Estradiol, ALPRAZolam, valACYclovir, promethazine-dextromethorphan, benzonatate, zolpidem, meclizine, valACYclovir, and fluticasone. We administered methylPREDNISolone acetate.  Meds ordered this encounter  Medications   clindamycin (CLEOCIN) 300 MG capsule    Sig: Take 1 capsule (300 mg total) by mouth 3 (three) times daily.    Dispense:  21 capsule    Refill:  0    Order Specific Question:   Supervising Provider    Answer:   Pricilla Holm A [[id number]]   fluconazole (DIFLUCAN) 150 MG tablet    Sig: Take 1 tablet (150 mg total) by mouth once for 1 dose.    Dispense:  1 tablet    Refill:  0    Order Specific Question:   Supervising Provider    Answer:   Pricilla Holm A [[id number]]   methylPREDNISolone acetate (DEPO-MEDROL) injection 80 mg

## 2022-09-15 MED ORDER — METHYLPREDNISOLONE ACETATE 80 MG/ML IJ SUSP
80.0000 mg | Freq: Once | INTRAMUSCULAR | Status: AC
  Administered 2022-09-15: 80 mg via INTRAMUSCULAR

## 2022-09-15 NOTE — Addendum Note (Signed)
Addended by: Rossie Muskrat on: 09/15/2022 07:50 AM   Modules accepted: Orders

## 2022-10-09 ENCOUNTER — Encounter: Payer: Self-pay | Admitting: Family Medicine

## 2022-10-10 NOTE — Telephone Encounter (Signed)
Pt is still experiencing diarrhea despite trying probiotic, OTC medications and lots of water and is wondering if there is anything else she can do?

## 2022-10-16 ENCOUNTER — Other Ambulatory Visit: Payer: Self-pay | Admitting: Internal Medicine

## 2022-11-21 ENCOUNTER — Encounter: Payer: Self-pay | Admitting: Family Medicine

## 2022-12-15 ENCOUNTER — Ambulatory Visit (INDEPENDENT_AMBULATORY_CARE_PROVIDER_SITE_OTHER): Payer: Medicare Other | Admitting: Internal Medicine

## 2022-12-15 ENCOUNTER — Encounter: Payer: Self-pay | Admitting: Internal Medicine

## 2022-12-15 VITALS — BP 130/80 | HR 67 | Temp 98.2°F | Ht 65.0 in | Wt 120.2 lb

## 2022-12-15 DIAGNOSIS — E538 Deficiency of other specified B group vitamins: Secondary | ICD-10-CM | POA: Diagnosis not present

## 2022-12-15 DIAGNOSIS — R197 Diarrhea, unspecified: Secondary | ICD-10-CM

## 2022-12-15 MED ORDER — CYANOCOBALAMIN 1000 MCG/ML IJ SOLN
1000.0000 ug | Freq: Once | INTRAMUSCULAR | Status: AC
  Administered 2022-12-15: 1000 ug via INTRAMUSCULAR

## 2022-12-15 NOTE — Progress Notes (Signed)
   Subjective:   Patient ID: [name], female    DOB: [birth year], 67 y.o.   MRN: [id number]  HPI The patient is a 67 YO female coming in for diarrhea. Going on about 2 months. Since 2 courses of antibiotics.   Review of Systems  Constitutional: Negative.   HENT: Negative.    Eyes: Negative.   Respiratory:  Negative for cough, chest tightness and shortness of breath.   Cardiovascular:  Negative for chest pain, palpitations and leg swelling.  Gastrointestinal:  Positive for abdominal distention, abdominal pain and diarrhea. Negative for anal bleeding, blood in stool, constipation, nausea and vomiting.  Musculoskeletal: Negative.   Skin: Negative.   Neurological: Negative.   Psychiatric/Behavioral: Negative.      Objective:  Physical Exam Constitutional:      Appearance: She is well-developed.  HENT:     Head: Normocephalic and atraumatic.  Cardiovascular:     Rate and Rhythm: Normal rate and regular rhythm.  Pulmonary:     Effort: Pulmonary effort is normal. No respiratory distress.     Breath sounds: Normal breath sounds. No wheezing or rales.  Abdominal:     General: Bowel sounds are normal. There is no distension.     Palpations: Abdomen is soft.     Tenderness: There is no abdominal tenderness. There is no rebound.  Musculoskeletal:     Cervical back: Normal range of motion.  Skin:    General: Skin is warm and dry.  Neurological:     Mental Status: She is alert and oriented to person, place, and time.     Coordination: Coordination normal.     Vitals:   12/15/22 1600  BP: 130/80  Pulse: 67  Temp: 98.2 F (36.8 C)  TempSrc: Oral  SpO2: 97%  Weight: 120 lb 3.2 oz (54.5 kg)  Height: '5\' 5"'$  (1.651 m)    Assessment & Plan:  B12 shot given at visit

## 2022-12-15 NOTE — Patient Instructions (Signed)
We will check the diarrhea for the cause.

## 2022-12-15 NOTE — Assessment & Plan Note (Signed)
Given B12 shot previously low and low normal with 2 months of diarrhea she is surely low and having a lot of fatigue.

## 2022-12-15 NOTE — Assessment & Plan Note (Signed)
Suspicious for C dif given 2 courses of antibiotics prior to onset and specifically last course was clindamycin. Checking GI profile and CBC and CMP. Treat as appropriate.

## 2022-12-16 LAB — COMPREHENSIVE METABOLIC PANEL
AG Ratio: 2.2 (calc) (ref 1.0–2.5)
ALT: 10 U/L (ref 6–29)
AST: 15 U/L (ref 10–35)
Albumin: 4.7 g/dL (ref 3.6–5.1)
Alkaline phosphatase (APISO): 80 U/L (ref 37–153)
BUN: 20 mg/dL (ref 7–25)
CO2: 24 mmol/L (ref 20–32)
Calcium: 9.6 mg/dL (ref 8.6–10.4)
Chloride: 106 mmol/L (ref 98–110)
Creat: 0.92 mg/dL (ref 0.50–1.05)
Globulin: 2.1 g/dL (calc) (ref 1.9–3.7)
Glucose, Bld: 92 mg/dL (ref 65–99)
Potassium: 4.2 mmol/L (ref 3.5–5.3)
Sodium: 143 mmol/L (ref 135–146)
Total Bilirubin: 1.9 mg/dL — ABNORMAL HIGH (ref 0.2–1.2)
Total Protein: 6.8 g/dL (ref 6.1–8.1)

## 2022-12-16 LAB — CBC
HCT: 39.1 % (ref 35.0–45.0)
Hemoglobin: 13.8 g/dL (ref 11.7–15.5)
MCH: 30.2 pg (ref 27.0–33.0)
MCHC: 35.3 g/dL (ref 32.0–36.0)
MCV: 85.6 fL (ref 80.0–100.0)
MPV: 11.4 fL (ref 7.5–12.5)
Platelets: 324 10*3/uL (ref 140–400)
RBC: 4.57 10*6/uL (ref 3.80–5.10)
RDW: 13 % (ref 11.0–15.0)
WBC: 6.4 10*3/uL (ref 3.8–10.8)

## 2022-12-26 ENCOUNTER — Encounter: Payer: Self-pay | Admitting: Internal Medicine

## 2023-01-29 DIAGNOSIS — J320 Chronic maxillary sinusitis: Secondary | ICD-10-CM | POA: Diagnosis not present

## 2023-01-29 DIAGNOSIS — J342 Deviated nasal septum: Secondary | ICD-10-CM | POA: Diagnosis not present

## 2023-01-29 DIAGNOSIS — R42 Dizziness and giddiness: Secondary | ICD-10-CM | POA: Diagnosis not present

## 2023-01-29 DIAGNOSIS — J329 Chronic sinusitis, unspecified: Secondary | ICD-10-CM | POA: Diagnosis not present

## 2023-01-29 DIAGNOSIS — J3489 Other specified disorders of nose and nasal sinuses: Secondary | ICD-10-CM | POA: Diagnosis not present

## 2023-02-01 DIAGNOSIS — R42 Dizziness and giddiness: Secondary | ICD-10-CM | POA: Diagnosis not present

## 2023-02-23 ENCOUNTER — Encounter: Payer: Self-pay | Admitting: Internal Medicine

## 2023-02-26 ENCOUNTER — Telehealth: Payer: Self-pay | Admitting: Internal Medicine

## 2023-02-26 NOTE — Telephone Encounter (Signed)
We have received FMLA PW in the fax, to be filled out by provider. Patient requested to send it via Fax within 7-days. Document is located in providers tray at front office.Please advise at Mobile [phone] (mobile)   Please fax to: [phone]

## 2023-02-27 ENCOUNTER — Telehealth: Payer: Self-pay | Admitting: Internal Medicine

## 2023-02-27 NOTE — Telephone Encounter (Signed)
Called patient and left a very detailed message for her to give our office a call back and let us know  about how many days she would need for her FMLA and also to get her scheduled for either in office visit or virtual.

## 2023-02-27 NOTE — Telephone Encounter (Signed)
We have received FMLA PW in the fax, to be filled out by provider. Patient requested to send it via Fax within 7-days. Document is located in providers tray at front office.Please advise at Mobile [phone] (mobile)   Please fax to: [phone]

## 2023-03-01 ENCOUNTER — Telehealth: Payer: Self-pay

## 2023-03-01 DIAGNOSIS — Z0289 Encounter for other administrative examinations: Secondary | ICD-10-CM

## 2023-03-01 NOTE — Telephone Encounter (Signed)
Contacted [place] to schedule their annual wellness visit. Appointment made for 03/06/23.  Norton Blizzard, [place] ([facility])  [place] [facility] [phone]

## 2023-03-06 ENCOUNTER — Ambulatory Visit (INDEPENDENT_AMBULATORY_CARE_PROVIDER_SITE_OTHER): Payer: Medicare Other

## 2023-03-06 VITALS — Ht 65.0 in | Wt 121.0 lb

## 2023-03-06 DIAGNOSIS — Z0000 Encounter for general adult medical examination without abnormal findings: Secondary | ICD-10-CM

## 2023-03-06 NOTE — Progress Notes (Signed)
I connected with  [name] on 03/06/23 by a audio enabled telemedicine application and verified that I am speaking with the correct person using two identifiers.  Patient Location: Home  Provider Location: Office/Clinic  I discussed the limitations of evaluation and management by telemedicine. The patient expressed understanding and agreed to proceed.  Subjective:   [name] is a 67 y.o. female who presents for an Initial Medicare Annual Wellness Visit.  Review of Systems     Cardiac Risk Factors include: advanced age (>53men, >66 women);dyslipidemia;family history of premature cardiovascular disease     Objective:    Today's Vitals   03/06/23 1434 03/06/23 1448  Weight: 121 lb (54.9 kg)   Height: 5\' 5"  (1.651 m)   PainSc: 0-No pain 0-No pain   Body mass index is 20.14 kg/m.     03/06/2023    2:36 PM 07/10/2019    5:20 PM 10/16/2016    8:56 PM 10/13/2015    3:29 PM  Advanced Directives  Does Patient Have a Medical Advance Directive? No No No No  Would patient like information on creating a medical advance directive? No - Patient declined       Current Medications (verified) Outpatient Encounter Medications as of 03/06/2023  Medication Sig   ALPRAZolam (XANAX) 0.25 MG tablet Take 1 tablet (0.25 mg total) by mouth 2 (two) times daily as needed for anxiety.   benzonatate (TESSALON) 200 MG capsule Take 1 capsule (200 mg total) by mouth 3 (three) times daily as needed.   Biotin 5 MG CAPS Take 1 capsule by mouth daily.   Calcium Citrate-Vitamin D 250-200 MG-UNIT TABS Take 1 tablet by mouth 2 (two) times daily.   Estradiol 4 MCG INST Imvexxy Maintenance Pack 4 mcg vaginal insert   fluconazole (DIFLUCAN) 150 MG tablet Take 150 mg by mouth once.   fluticasone (FLONASE) 50 MCG/ACT nasal spray Place 2 sprays into both nostrils daily.   Glucosamine Sulfate 1000 MG CAPS Take 1 capsule by mouth daily.    meclizine (ANTIVERT) 12.5 MG tablet Take 1 tablet (12.5 mg total) by  mouth 3 (three) times daily as needed for dizziness.   Omega-3 Fatty Acids (FISH OIL PO) Take 2,000 mg by mouth 2 (two) times daily.   omeprazole (PRILOSEC) 20 MG capsule Take 20 mg by mouth 2 (two) times daily as needed.   promethazine-dextromethorphan (PROMETHAZINE-DM) 6.25-15 MG/5ML syrup Take 5 mLs by mouth 4 (four) times daily as needed.   valACYclovir (VALTREX) 1000 MG tablet Take 1 tablet by mouth twice a day for 3 days for each flare.   valACYclovir (VALTREX) 1000 MG tablet Take 1 tablet by mouth twice a day for 3 days then as needed   zolpidem (AMBIEN CR) 6.25 MG CR tablet TAKE ONE TABLET BY MOUTH AT BEDTIME   No facility-administered encounter medications on file as of 03/06/2023.    Allergies (verified) Augmentin [amoxicillin-pot clavulanate], Codeine phosphate, Darvon [propoxyphene], Oxycodone-acetaminophen, Propoxyphene n-acetaminophen, and Sulfamethoxazole   History: Past Medical History:  Diagnosis Date   Acid reflux    Cancer    precancerous cervix area   Depression    Hyperlipidemia    Past Surgical History:  Procedure Laterality Date   APPENDECTOMY     BREAST ENHANCEMENT SURGERY     CYSTOSCOPY WITH RETROGRADE PYELOGRAM, URETEROSCOPY AND STENT PLACEMENT Right 10/17/2016   Procedure: CYSTOSCOPY WITH RETROGRADE PYELOGRAM, URETEROSCOPY LASER LITHOTRIPSY AND STENT PLACEMENT;  Surgeon: Ardis Hughs, MD;  Location: [facility];  Service: Urology;  Laterality: Right;   DILATION AND CURETTAGE OF UTERUS     maxillofacial surgery     for TMJ   TONSILLECTOMY     UPPER GASTROINTESTINAL ENDOSCOPY  2002   Dr Michail Sermon   Family History  Problem Relation Age of Onset   Stroke Father 10       CVA multiple   Heart disease Father    Emphysema Mother    Cancer Sister        BREAST CANCER   Alzheimer's disease Brother    Diabetes Brother    Stroke Brother    Cancer Sister        BREAST CANCER   Breast cancer Maternal Aunt    Colon cancer Neg Hx    Social History    Socioeconomic History   Marital status: Divorced    Spouse name: Not on file   Number of children: 0   Years of education: Not on file   Highest education level: Not on file  Occupational History   Occupation: retired  Tobacco Use   Smoking status: Former    Packs/day: 0.30    Years: 4.00    Additional pack years: 0.00    Total pack years: 1.20    Types: Cigarettes    Quit date: 12/04/1992    Years since quitting: 30.2   Smokeless tobacco: Never  Substance and Sexual Activity   Alcohol use: No    Alcohol/week: 0.0 standard drinks of alcohol    Comment: quit 2.5 yrs ago   Drug use: No   Sexual activity: Not Currently    Comment: divorce  Other Topics Concern   Not on file  Social History Narrative   Not on file   Social Determinants of Health   Financial Resource Strain: Low Risk  (03/06/2023)   Overall Financial Resource Strain (CARDIA)    Difficulty of Paying Living Expenses: Not hard at all  Food Insecurity: No Food Insecurity (03/06/2023)   Hunger Vital Sign    Worried About Running Out of Food in the Last Year: Never true    Ran Out of Food in the Last Year: Never true  Transportation Needs: No Transportation Needs (03/06/2023)   PRAPARE - Hydrologist (Medical): No    Lack of Transportation (Non-Medical): No  Physical Activity: Insufficiently Active (03/06/2023)   Exercise Vital Sign    Days of Exercise per Week: 5 days    Minutes of Exercise per Session: 10 min  Stress: Stress Concern Present (03/06/2023)   Peck    Feeling of Stress : To some extent  Social Connections: Unknown (03/06/2023)   Social Connection and Isolation Panel [NHANES]    Frequency of Communication with Friends and Family: Twice a week    Frequency of Social Gatherings with Friends and Family: Not on file    Attends Religious Services: Not on Advertising copywriter or Organizations: No     Attends Music therapist: Not on file    Marital Status: Divorced    Tobacco Counseling Counseling given: Not Answered   Clinical Intake:  Pre-visit preparation completed: Yes  Pain : No/denies pain Pain Score: 0-No pain     BMI - recorded: 20.14 Nutritional Status: BMI of 19-24  Normal Nutritional Risks: None Diabetes: No  How often do you need to have someone help you when you read instructions, pamphlets, or other written materials from your  doctor or pharmacy?: 3 - Sometimes What is the last grade level you completed in school?: HSG  Diabetic? No  Interpreter Needed?: No  Information entered by :: Lisette Abu, LPN.   Activities of Daily Living    03/06/2023    2:40 PM 03/02/2023   10:26 AM  In your present state of health, do you have any difficulty performing the following activities:  Hearing? 0 0  Vision? 1 1  Difficulty concentrating or making decisions? 0 0  Walking or climbing stairs? 0 0  Dressing or bathing? 0 0  Doing errands, shopping? 0 0  Preparing Food and eating ? N N  Using the Toilet? N N  In the past six months, have you accidently leaked urine? N N  Do you have problems with loss of bowel control? N N  Managing your Medications? N N  Managing your Finances? N N  Housekeeping or managing your Housekeeping? N N    Patient Care Team: Hoyt Koch, MD as PCP - General (Internal Medicine) Larey Dresser, MD (Cardiology) Marica Otter, OD as Consulting Physician (Optometry)  Indicate any recent Medical Services you may have received from other than [facility] providers in the past year (date may be approximate).     Assessment:   This is a routine wellness examination for Lynanne.  Hearing/Vision screen Hearing Screening - Comments:: Denies hearing difficulties.  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        Depression Screen    03/06/2023    2:38 PM 12/15/2022    4:06 PM 09/13/2022    1:50 PM 04/18/2022    3:49 PM 03/29/2021    3:28 PM 10/16/2016    5:44 PM 12/16/2015    9:34 AM  PHQ 2/9 Scores  PHQ - 2 Score 0 3 3 0 0 0 0  PHQ- 9 Score 0 8 8 0       Fall Risk    03/06/2023    2:45 PM 03/02/2023   10:26 AM 12/15/2022    4:05 PM 09/13/2022    1:50 PM 04/18/2022    3:49 PM  Fall Risk   Falls in the past year? 0 0 0 0 0  Number falls in past yr: 0  0 0 0  Injury with Fall? 0 0 0 0 0  Risk for fall due to : No Fall Risks  No Fall Risks No Fall Risks   Follow up Falls prevention discussed  Falls evaluation completed Falls evaluation completed     FALL RISK PREVENTION PERTAINING TO THE HOME:  Any stairs in or around the home? Yes  to attic If so, are there any without handrails? No  Home free of loose throw rugs in walkways, pet beds, electrical cords, etc? Yes  Adequate lighting in your home to reduce risk of falls? Yes   ASSISTIVE DEVICES UTILIZED TO PREVENT FALLS:  Life alert? No  Use of a cane, walker or w/c? No  Grab bars in  the bathroom? No  Shower chair or bench in shower? No  Elevated toilet seat or a handicapped toilet? No   TIMED UP AND GO:  Was the test performed? No . Telephonic Visit  Cognitive Function:        03/06/2023    2:45 PM  6CIT Screen  What Year? 0 points  What month? 0 points  What time? 0 points  Count back from 20 0 points  Months in reverse 0 points  Repeat phrase 0 points  Total Score 0 points    Immunizations Immunization History  Administered  Date(s) Administered   Influenza Split 09/02/2013   Influenza,inj,Quad PF,6+ Mos 08/18/2016, 09/25/2017, 08/12/2018   Influenza-Unspecified 09/02/2015, 09/17/2019, 09/22/2022   PFIZER(Purple Top)SARS-COV-2 Vaccination 12/13/2019, 01/03/2020, 10/02/2020   Td 12/04/2010    TDAP status: Due, Education has been provided regarding the importance of this vaccine. Advised may receive this vaccine at local pharmacy or Health Dept. Aware to provide a copy of the vaccination record if obtained from local pharmacy or Health Dept. Verbalized acceptance and understanding.  Flu Vaccine status: Up to date  Pneumococcal vaccine status: Due, Education has been provided regarding the importance of this vaccine. Advised may receive this vaccine at local pharmacy or Health Dept. Aware to provide a copy of the vaccination record if obtained from local pharmacy or Health Dept. Verbalized acceptance and understanding.  Covid-19 vaccine status: Completed vaccines  Qualifies for Shingles Vaccine? Yes   Zostavax completed No   Shingrix Completed?: No.    Education has been provided regarding the importance of this vaccine. Patient has been advised to call insurance company to determine out of pocket expense if they have not yet received this vaccine. Advised may also receive vaccine at local pharmacy or Health Dept. Verbalized acceptance and understanding.  Screening Tests Health Maintenance  Topic Date Due   Pneumonia Vaccine 38+ Years old (1 of 2 - PCV) Never done   COVID-19 Vaccine (4 - 2023-24 season) 08/04/2022   Zoster Vaccines- Shingrix (1 of 2) 03/16/2023 (Originally 03/05/2006)   DEXA SCAN  12/16/2023 (Originally 03/05/2021)   INFLUENZA VACCINE  07/05/2023   Medicare Annual Wellness (AWV)  03/05/2024   MAMMOGRAM  07/29/2024   COLONOSCOPY (Pts 45-62yrs Insurance coverage will need to be confirmed)  10/17/2025   Hepatitis C Screening  Completed   HPV VACCINES  Aged Out   DTaP/Tdap/Td  Discontinued     Health Maintenance  Health Maintenance Due  Topic Date Due   Pneumonia Vaccine 14+ Years old (1 of 2 - PCV) Never done   COVID-19 Vaccine (4 - 2023-24 season) 08/04/2022    Colorectal cancer screening: Type of screening: Colonoscopy. Completed 10/18/2015. Repeat every 10 years  Mammogram status: Completed 07/29/2022. Repeat every year  Bone Density status: Never done  Lung Cancer Screening: (Low Dose CT Chest recommended if Age 67-80 years, 30 pack-year currently smoking OR have quit w/in 15years.) does not qualify.   Lung Cancer Screening Referral: no  Additional Screening:  Hepatitis C Screening: does qualify; Completed 07/26/2015  Vision Screening: Recommended annual ophthalmology exams for early detection of glaucoma and other disorders of the eye. Is the patient up to date with their annual eye exam?  Yes  Who is the provider or what is the name of the office in which the patient attends annual eye exams? Marica Otter, OD. If pt is not established with a provider, would they like to be referred to a provider to establish care? No .  Dental Screening: Recommended annual dental exams for proper oral hygiene  Community Resource Referral / Chronic Care Management: CRR required this visit?  No   CCM required this visit?  No      Plan:     I have personally reviewed and noted the following in the patient's chart:   Medical and social history Use of alcohol, tobacco or illicit drugs  Current medications and supplements including opioid prescriptions. Patient is not currently taking opioid prescriptions. Functional ability and status Nutritional status Physical activity Advanced directives List of other physicians Hospitalizations, surgeries, and ER visits in previous 12 months Vitals Screenings to include cognitive, depression, and falls Referrals and appointments  In addition, I have reviewed and discussed with patient certain preventive protocols, quality  metrics, and best practice recommendations. A written personalized care plan for preventive services as well as general preventive health recommendations were provided to patient.     Sheral Flow, LPN   [id number]   Nurse Notes: Normal cognitive status assessed by direct observation over the phone by this Nurse Health Advisor. No abnormalities found.

## 2023-03-06 NOTE — Patient Instructions (Addendum)
[name] , Thank you for taking time to come for your Medicare Wellness Visit. I appreciate your ongoing commitment to your health goals. Please review the following plan we discussed and let me know if I can assist you in the future.   These are the goals we discussed:  Goals      My goal for 2024 is to stay healthy.        This is a list of the screening recommended for you and due dates:  Health Maintenance  Topic Date Due   Pneumonia Vaccine (1 of 2 - PCV) Never done   COVID-19 Vaccine (4 - 2023-24 season) 08/04/2022   Zoster (Shingles) Vaccine (1 of 2) 03/16/2023*   DEXA scan (bone density measurement)  12/16/2023*   Flu Shot  07/05/2023   Medicare Annual Wellness Visit  03/05/2024   Mammogram  07/29/2024   Colon Cancer Screening  10/17/2025   Hepatitis C Screening: USPSTF Recommendation to screen - Ages 18-79 yo.  Completed   HPV Vaccine  Aged Out   DTaP/Tdap/Td vaccine  Discontinued  *Topic was postponed. The date shown is not the original due date.    Advanced directives: No  Conditions/risks identified: Yes  Next appointment: Follow up in one year for your annual wellness visit.07/29/2022   Preventive Care 65 Years and Older, Female Preventive care refers to lifestyle choices and visits with your health care provider that can promote health and wellness. What does preventive care include? A yearly physical exam. This is also called an annual well check. Dental exams once or twice a year. Routine eye exams. Ask your health care provider how often you should have your eyes checked. Personal lifestyle choices, including: Daily care of your teeth and gums. Regular physical activity. Eating a healthy diet. Avoiding tobacco and drug use. Limiting alcohol use. Practicing safe sex. Taking low-dose aspirin every day. Taking vitamin and mineral supplements as recommended by your health care provider. What happens during an annual well check? The services and  screenings done by your health care provider during your annual well check will depend on your age, overall health, lifestyle risk factors, and family history of disease. Counseling  Your health care provider may ask you questions about your: Alcohol use. Tobacco use. Drug use. Emotional well-being. Home and relationship well-being. Sexual activity. Eating habits. History of falls. Memory and ability to understand (cognition). Work and work Statistician. Reproductive health. Screening  You may have the following tests or measurements: Height, weight, and BMI. Blood pressure. Lipid and cholesterol levels. These may be checked every 5 years, or more frequently if you are over 50 years old. Skin check. Lung cancer screening. You may have this screening every year starting at age 80 if you have a 30-pack-year history of smoking and currently smoke or have quit within the past 15 years. Fecal occult blood test (FOBT) of the stool. You may have this test every year starting at age 72. Flexible sigmoidoscopy or colonoscopy. You may have a sigmoidoscopy every 5 years or a colonoscopy every 10 years starting at age 72. Hepatitis C blood test. Hepatitis B blood test. Sexually transmitted disease (STD) testing. Diabetes screening. This is done by checking your blood sugar (glucose) after you have not eaten for a while (fasting). You may have this done every 1-3 years. Bone density scan. This is done to screen for osteoporosis. You may have this done starting at age 52. Mammogram. This may be done every 1-2 years. Talk to your  health care provider about how often you should have regular mammograms. Talk with your health care provider about your test results, treatment options, and if necessary, the need for more tests. Vaccines  Your health care provider may recommend certain vaccines, such as: Influenza vaccine. This is recommended every year. Tetanus, diphtheria, and acellular pertussis (Tdap,  Td) vaccine. You may need a Td booster every 10 years. Zoster vaccine. You may need this after age 64. Pneumococcal 13-valent conjugate (PCV13) vaccine. One dose is recommended after age 60. Pneumococcal polysaccharide (PPSV23) vaccine. One dose is recommended after age 9. Talk to your health care provider about which screenings and vaccines you need and how often you need them. This information is not intended to replace advice given to you by your health care provider. Make sure you discuss any questions you have with your health care provider. Document Released: 12/17/2015 Document Revised: 08/09/2016 Document Reviewed: 09/21/2015 Elsevier Interactive Patient Education  2017 [employer] Prevention in the Home Falls can cause injuries. They can happen to people of all ages. There are many things you can do to make your home safe and to help prevent falls. What can I do on the outside of my home? Regularly fix the edges of walkways and driveways and fix any cracks. Remove anything that might make you trip as you walk through a door, such as a raised step or threshold. Trim any bushes or trees on the path to your home. Use bright outdoor lighting. Clear any walking paths of anything that might make someone trip, such as rocks or tools. Regularly check to see if handrails are loose or broken. Make sure that both sides of any steps have handrails. Any raised decks and porches should have guardrails on the edges. Have any leaves, snow, or ice cleared regularly. Use sand or salt on walking paths during winter. Clean up any spills in your garage right away. This includes oil or grease spills. What can I do in the bathroom? Use night lights. Install grab bars by the toilet and in the tub and shower. Do not use towel bars as grab bars. Use non-skid mats or decals in the tub or shower. If you need to sit down in the shower, use a plastic, non-slip stool. Keep the floor dry. Clean up any  water that spills on the floor as soon as it happens. Remove soap buildup in the tub or shower regularly. Attach bath mats securely with double-sided non-slip rug tape. Do not have throw rugs and other things on the floor that can make you trip. What can I do in the bedroom? Use night lights. Make sure that you have a light by your bed that is easy to reach. Do not use any sheets or blankets that are too big for your bed. They should not hang down onto the floor. Have a firm chair that has side arms. You can use this for support while you get dressed. Do not have throw rugs and other things on the floor that can make you trip. What can I do in the kitchen? Clean up any spills right away. Avoid walking on wet floors. Keep items that you use a lot in easy-to-reach places. If you need to reach something above you, use a strong step stool that has a grab bar. Keep electrical cords out of the way. Do not use floor polish or wax that makes floors slippery. If you must use wax, use non-skid floor wax. Do not have  throw rugs and other things on the floor that can make you trip. What can I do with my stairs? Do not leave any items on the stairs. Make sure that there are handrails on both sides of the stairs and use them. Fix handrails that are broken or loose. Make sure that handrails are as long as the stairways. Check any carpeting to make sure that it is firmly attached to the stairs. Fix any carpet that is loose or worn. Avoid having throw rugs at the top or bottom of the stairs. If you do have throw rugs, attach them to the floor with carpet tape. Make sure that you have a light switch at the top of the stairs and the bottom of the stairs. If you do not have them, ask someone to add them for you. What else can I do to help prevent falls? Wear shoes that: Do not have high heels. Have rubber bottoms. Are comfortable and fit you well. Are closed at the toe. Do not wear sandals. If you use a  stepladder: Make sure that it is fully opened. Do not climb a closed stepladder. Make sure that both sides of the stepladder are locked into place. Ask someone to hold it for you, if possible. Clearly mark and make sure that you can see: Any grab bars or handrails. First and last steps. Where the edge of each step is. Use tools that help you move around (mobility aids) if they are needed. These include: Canes. Walkers. Scooters. Crutches. Turn on the lights when you go into a dark area. Replace any light bulbs as soon as they burn out. Set up your furniture so you have a clear path. Avoid moving your furniture around. If any of your floors are uneven, fix them. If there are any pets around you, be aware of where they are. Review your medicines with your doctor. Some medicines can make you feel dizzy. This can increase your chance of falling. Ask your doctor what other things that you can do to help prevent falls. This information is not intended to replace advice given to you by your health care provider. Make sure you discuss any questions you have with your health care provider. Document Released: 09/16/2009 Document Revised: 04/27/2016 Document Reviewed: 12/25/2014 Elsevier Interactive Patient Education  2017 [employer].

## 2023-03-09 ENCOUNTER — Encounter: Payer: Self-pay | Admitting: Internal Medicine

## 2023-03-09 ENCOUNTER — Ambulatory Visit (INDEPENDENT_AMBULATORY_CARE_PROVIDER_SITE_OTHER): Payer: Medicare Other | Admitting: Internal Medicine

## 2023-03-09 VITALS — BP 170/95 | HR 75 | Temp 98.0°F | Ht 65.0 in | Wt 125.0 lb

## 2023-03-09 DIAGNOSIS — J3489 Other specified disorders of nose and nasal sinuses: Secondary | ICD-10-CM | POA: Diagnosis not present

## 2023-03-09 DIAGNOSIS — J0141 Acute recurrent pansinusitis: Secondary | ICD-10-CM | POA: Diagnosis not present

## 2023-03-09 NOTE — Assessment & Plan Note (Signed)
Just finishing doxycycline 2 week course and resolving but not gone. If no improvement in 1-2 weeks we can try 1-2 week course of azithromycin. 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

## 2023-03-09 NOTE — Progress Notes (Signed)
   Subjective:   Patient ID: [name], female    DOB: 1956/05/08, 67 y.o.   MRN: [id number]  HPI The patient is a 67 YO female coming in for follow up. Sinus infection gradually resolving with antibiotics. BP high and was taking a lot of otc cold/sinus products recently.   Review of Systems  Constitutional: Negative.   HENT:  Positive for congestion and sinus pain.   Eyes: Negative.   Respiratory:  Negative for cough, chest tightness and shortness of breath.   Cardiovascular:  Negative for chest pain, palpitations and leg swelling.  Gastrointestinal:  Negative for abdominal distention, abdominal pain, constipation, diarrhea, nausea and vomiting.  Musculoskeletal: Negative.   Skin: Negative.   Neurological: Negative.   Psychiatric/Behavioral: Negative.      Objective:  Physical Exam Constitutional:      Appearance: She is well-developed.  HENT:     Head: Normocephalic and atraumatic.  Cardiovascular:     Rate and Rhythm: Normal rate and regular rhythm.  Pulmonary:     Effort: Pulmonary effort is normal. No respiratory distress.     Breath sounds: Normal breath sounds. No wheezing or rales.  Abdominal:     General: Bowel sounds are normal. There is no distension.     Palpations: Abdomen is soft.     Tenderness: There is no abdominal tenderness. There is no rebound.  Musculoskeletal:     Cervical back: Normal range of motion.  Skin:    General: Skin is warm and dry.  Neurological:     Mental Status: She is alert and oriented to person, place, and time.     Coordination: Coordination normal.     Vitals:   03/09/23 1539 03/09/23 1543  BP: (!) 170/95 (!) 170/95  Pulse: 75   Temp: 98 F (36.7 C)   TempSrc: Oral   SpO2: 99%   Weight: 125 lb (56.7 kg)   Height: 5\' 5"  (1.651 m)     Assessment & Plan:

## 2023-03-09 NOTE — Assessment & Plan Note (Signed)
Suspect otc sinus products causing high BP today. She will monitor in next 1-2 weeks to see if this is persistently elevated or returns to normal. BP previously all normal.

## 2023-03-26 ENCOUNTER — Ambulatory Visit (INDEPENDENT_AMBULATORY_CARE_PROVIDER_SITE_OTHER): Payer: Medicare Other | Admitting: Family Medicine

## 2023-03-26 ENCOUNTER — Encounter: Payer: Self-pay | Admitting: Family Medicine

## 2023-03-26 VITALS — BP 168/97 | HR 63 | Temp 97.7°F | Resp 20 | Ht 65.0 in | Wt 121.0 lb

## 2023-03-26 DIAGNOSIS — I10 Essential (primary) hypertension: Secondary | ICD-10-CM

## 2023-03-26 LAB — COMPREHENSIVE METABOLIC PANEL
ALT: 15 U/L (ref 0–35)
AST: 20 U/L (ref 0–37)
Albumin: 5.1 g/dL (ref 3.5–5.2)
Alkaline Phosphatase: 90 U/L (ref 39–117)
BUN: 17 mg/dL (ref 6–23)
CO2: 27 mEq/L (ref 19–32)
Calcium: 10.1 mg/dL (ref 8.4–10.5)
Chloride: 103 mEq/L (ref 96–112)
Creatinine, Ser: 0.9 mg/dL (ref 0.40–1.20)
GFR: 66.36 mL/min (ref >60.00)
Glucose, Bld: 95 mg/dL (ref 70–99)
Potassium: 3.8 mEq/L (ref 3.5–5.1)
Sodium: 139 mEq/L (ref 135–145)
Total Bilirubin: 1.9 mg/dL — ABNORMAL HIGH (ref 0.2–1.2)
Total Protein: 7.9 g/dL (ref 6.0–8.3)

## 2023-03-26 LAB — CBC WITH DIFFERENTIAL/PLATELET
Basophils Absolute: 0.1 10*3/uL (ref 0.0–0.1)
Basophils Relative: 1.1 % (ref 0.0–3.0)
Eosinophils Absolute: 0.1 10*3/uL (ref 0.0–0.7)
Eosinophils Relative: 1 % (ref 0.0–5.0)
HCT: 44.3 % (ref 36.0–46.0)
Hemoglobin: 15.2 g/dL — ABNORMAL HIGH (ref 12.0–15.0)
Lymphocytes Relative: 24.7 % (ref 12.0–46.0)
Lymphs Abs: 1.4 10*3/uL (ref 0.7–4.0)
MCHC: 34.2 g/dL (ref 30.0–36.0)
MCV: 88.2 fl (ref 78.0–100.0)
Monocytes Absolute: 0.3 10*3/uL (ref 0.1–1.0)
Monocytes Relative: 5.5 % (ref 3.0–12.0)
Neutro Abs: 3.9 10*3/uL (ref 1.4–7.7)
Neutrophils Relative %: 67.7 % (ref 43.0–77.0)
Platelets: 320 10*3/uL (ref 150.0–400.0)
RBC: 5.03 Mil/uL (ref 3.87–5.11)
RDW: 13.3 % (ref 11.5–15.5)
WBC: 5.8 10*3/uL (ref 4.0–10.5)

## 2023-03-26 LAB — TSH: TSH: 3.01 u[IU]/mL (ref 0.35–5.50)

## 2023-03-26 LAB — LIPID PANEL
Cholesterol: 217 mg/dL — ABNORMAL HIGH (ref 0–200)
HDL: 57.8 mg/dL (ref >39.00)
LDL Cholesterol: 140 mg/dL — ABNORMAL HIGH (ref 0–99)
NonHDL: 159.37
Total CHOL/HDL Ratio: 4
Triglycerides: 95 mg/dL (ref 0.0–149.0)
VLDL: 19 mg/dL (ref 0.0–40.0)

## 2023-03-26 MED ORDER — LOSARTAN POTASSIUM 25 MG PO TABS: 25.0000 mg | ORAL_TABLET | Freq: Every day | ORAL | 2 refills | Status: DC

## 2023-03-26 NOTE — Progress Notes (Signed)
Assessment & Plan:  Primary hypertension Assessment & Plan: New diagnosis.  Started on losartan 25 mg once daily as patient reports she is very sensitive to medications.  Encouraged her to continue monitoring her blood pressure and keeping a log to bring with her to her next appointment.  Education provided on the DASH diet.  Encouraged her to continue drinking plenty of water.  Orders: -     Losartan Potassium; Take 1 tablet (25 mg total) by mouth daily.  Dispense: 30 tablet; Refill: 2 -     Comprehensive metabolic panel -     CBC with Differential/Platelet -     TSH -     Lipid panel     Follow up plan: Return in about 4 weeks (around 04/23/2023) for HTN with PCP.  Wendy Boston, MSN, APRN, FNP-C  Subjective:  HPI: [name] is a 67 y.o. female presenting on 03/26/2023 for Dizziness (Occasional Elevated BP, fatigue more than normal )  Patient reports dizziness and slight headaches that started recently.  Her blood pressure was elevated at her last office visit earlier this month at which time she was diagnosed with a sinus infection.  She saw the doc of the day at work at which time her blood pressure was 147/95.  She was advised by her coworkers to monitor her blood pressure multiple times a day and keep a log.  A majority of her readings range 150-160s/90s.  She has cut out Coke for the past 2 weeks and has been drinking only water.  She does admit there is room for improvement in her diet.  She does not overeat, and only eats because she has to.  She does not smoke.  Her father did have multiple strokes, hyperlipidemia, hypertension, and heart disease.  She did have some Ritz crackers and a piece of bread this morning to settle her stomach before her appointment.    ROS: Negative unless specifically indicated above in HPI.   Relevant past medical history reviewed and updated as indicated.   Allergies and medications reviewed and updated.   Current Outpatient  Medications:    ALPRAZolam (XANAX) 0.25 MG tablet, Take 1 tablet (0.25 mg total) by mouth 2 (two) times daily as needed for anxiety., Disp: 60 tablet, Rfl: 5   Biotin 5 MG CAPS, Take 1 capsule by mouth daily., Disp: , Rfl:    Calcium Citrate-Vitamin D 250-200 MG-UNIT TABS, Take 1 tablet by mouth 2 (two) times daily., Disp: , Rfl:    Estradiol 4 MCG INST, Imvexxy Maintenance Pack 4 mcg vaginal insert, Disp: , Rfl:    fluticasone (FLONASE) 50 MCG/ACT nasal spray, Place 2 sprays into both nostrils daily., Disp: 16 g, Rfl: 0   Glucosamine Sulfate 1000 MG CAPS, Take 1 capsule by mouth daily. , Disp: , Rfl:    meclizine (ANTIVERT) 12.5 MG tablet, Take 1 tablet (12.5 mg total) by mouth 3 (three) times daily as needed for dizziness., Disp: 30 tablet, Rfl: 1   Omega-3 Fatty Acids (FISH OIL PO), Take 2,000 mg by mouth 2 (two) times daily., Disp: , Rfl:    omeprazole (PRILOSEC) 20 MG capsule, Take 20 mg by mouth 2 (two) times daily as needed., Disp: , Rfl:    zolpidem (AMBIEN CR) 6.25 MG CR tablet, TAKE ONE TABLET BY MOUTH AT BEDTIME, Disp: 30 tablet, Rfl: 4   valACYclovir (VALTREX) 1000 MG tablet, Take 1 tablet by mouth twice a day for 3 days then as needed (Patient not taking:  Reported on 03/26/2023), Disp: 30 tablet, Rfl: 3  Allergies  Allergen Reactions   Augmentin [Amoxicillin-Pot Clavulanate] Nausea And Vomiting   Codeine Phosphate Nausea And Vomiting   Darvon [Propoxyphene] Nausea And Vomiting   Oxycodone-Acetaminophen Nausea And Vomiting   Propoxyphene N-Acetaminophen Nausea And Vomiting   Sulfamethoxazole Nausea And Vomiting    Objective:   BP (!) 168/97 (BP Location: Left Arm, Patient Position: Sitting, Cuff Size: Normal) Comment: her omiron machine from home  Pulse 63   Temp 97.7 F (36.5 C)   Resp 20   Ht  (1.651 m)   Wt 121 lb (54.9 kg)   BMI 20.14 kg/m    Physical Exam Vitals reviewed.  Constitutional:      General: She is not in acute distress.    Appearance: Normal  appearance. She is normal weight. She is not ill-appearing, toxic-appearing or diaphoretic.  HENT:     Head: Normocephalic and atraumatic.  Eyes:     General: No scleral icterus.       Right eye: No discharge.        Left eye: No discharge.     Conjunctiva/sclera: Conjunctivae normal.  Cardiovascular:     Rate and Rhythm: Normal rate and regular rhythm.     Heart sounds: Normal heart sounds. No murmur heard.    No friction rub. No gallop.  Pulmonary:     Effort: Pulmonary effort is normal. No respiratory distress.     Breath sounds: Normal breath sounds. No stridor. No wheezing, rhonchi or rales.  Musculoskeletal:        General: Normal range of motion.     Cervical back: Normal range of motion.  Skin:    General: Skin is warm and dry.     Capillary Refill: Capillary refill takes less than 2 seconds.  Neurological:     General: No focal deficit present.     Mental Status: She is alert and oriented to person, place, and time. Mental status is at baseline.  Psychiatric:        Mood and Affect: Mood normal.        Behavior: Behavior normal.        Thought Content: Thought content normal.        Judgment: Judgment normal.

## 2023-03-26 NOTE — Assessment & Plan Note (Signed)
New diagnosis.  Started on losartan 25 mg once daily as patient reports she is very sensitive to medications.  Encouraged her to continue monitoring her blood pressure and keeping a log to bring with her to her next appointment.  Education provided on the DASH diet.  Encouraged her to continue drinking plenty of water.

## 2023-03-30 ENCOUNTER — Encounter: Payer: Self-pay | Admitting: Internal Medicine

## 2023-03-30 ENCOUNTER — Telehealth: Payer: Self-pay | Admitting: Internal Medicine

## 2023-03-30 ENCOUNTER — Encounter: Payer: Self-pay | Admitting: Family Medicine

## 2023-03-30 DIAGNOSIS — I10 Essential (primary) hypertension: Secondary | ICD-10-CM

## 2023-03-30 DIAGNOSIS — R42 Dizziness and giddiness: Secondary | ICD-10-CM

## 2023-03-30 NOTE — Telephone Encounter (Signed)
For our records:  Pt called with the access nurse and reported having high blood pressure. Pt took the reading twice and had 178/90 the first time, waited a few minutes and got 180/99 the second time. Pt also reported feeling dizzy.   Requested for access nurse to call 911 for the pt so that they could receive immediate care

## 2023-04-02 NOTE — Telephone Encounter (Signed)
This message was sent to crawford Friday and she did see pt message.  This was on a different encounter following the pt concern of BP.   "Referral done for cardiology. Okay to keep monitoring BP for now let us know how this is going. "

## 2023-04-06 ENCOUNTER — Ambulatory Visit: Payer: Medicare Other | Attending: Cardiovascular Disease | Admitting: Cardiovascular Disease

## 2023-04-06 ENCOUNTER — Encounter: Payer: Self-pay | Admitting: Cardiovascular Disease

## 2023-04-06 VITALS — BP 134/86 | HR 62 | Ht 64.0 in | Wt 121.4 lb

## 2023-04-06 DIAGNOSIS — I701 Atherosclerosis of renal artery: Secondary | ICD-10-CM | POA: Diagnosis not present

## 2023-04-06 DIAGNOSIS — I10 Essential (primary) hypertension: Secondary | ICD-10-CM | POA: Diagnosis not present

## 2023-04-06 DIAGNOSIS — R079 Chest pain, unspecified: Secondary | ICD-10-CM | POA: Diagnosis not present

## 2023-04-06 MED ORDER — LOSARTAN POTASSIUM 50 MG PO TABS: 50.0000 mg | ORAL_TABLET | Freq: Every day | ORAL | 3 refills | Status: DC

## 2023-04-06 NOTE — Progress Notes (Signed)
Cardiology Office Note:    Date:  04/06/2023   ID:  [name], DOB 1956/05/23, MRN [id number]  PCP:  Myrlene Broker, MD   [facility] Providers Cardiologist:  None     Referring MD: Myrlene Broker, *   Chief Complaint  Patient presents with   Hypertension   Consult  [name] is a 67 y.o. female who is being seen today for the evaluation of recent onset hypertension at the request of Myrlene Broker, *.   History of Present Illness:    [name] is a 67 y.o. female with a hx of generally excellent health, moderate hypercholesterolemia, recently found to have symptomatic hypertension.  4 weeks ago she was feeling "foggy", "fuzzy", but did not have headaches, palpitations or shortness of breath.  She checked her blood pressure in the cardiology clinic and this was markedly elevated in the 160s/90s.  Treatment was initiated with losartan 25 mg once daily and her blood pressure has improved somewhat, but she still has systolic blood pressure that is consistently above 130 (commonly in the 150s-160s) and diastolic blood pressure over 80 (as high as 97).  She woke up feeling flushed around 4:00 in the morning a few nights ago and her blood pressure was 164/96.  In April 2022 her blood pressure was 102/78.  In August 2022 her blood pressure is 112/80.  May 2023 her blood pressure was 126/80.  In October 2023 her blood pressure was 134/76/-140/84 (orthostatic checks).  In January 2024 her blood pressure was 130/80.  On March 09, 2023 her blood pressure was 170/95.  She denies chest pain or shortness of breath either at rest or with activity.  She enjoys working in her garden.  She does not engage in routine physical activity for fun, since she works 2 jobs and feels tired at the end of the day.  She denies headaches or focal neurological complaints.  She has occasional palpitations if she is "scared or upset".  These are never sustained.  She does not  have lower extremity edema.  She was worried about hair loss so her thyroid was checked and is normal.  She does not have heat intolerance, major weight changes or other symptoms of hyper or hypothyroidism.  She denies hematuria, nocturia, polyuria/polydipsia.  She does not have visual changes.  She very rarely will take over-the-counter ibuprofen, less than monthly.  Several years ago she had markedly elevated cholesterol, cholesterol 225 and LDL 162 in 2017.  She was taking a statin, but by changing her diet and eating a healthy lifestyle was able to reduce her cholesterol not to discontinue statin therapy.  She has never been overweight.  For a while took Vytorin and did okay with that.  Of note, she may have had some side effects with atorvastatin, but tolerated rosuvastatin well.  In 2019, LDL was as low as 31 and total cholesterol 99 while on treatment with rosuvastatin.  During those years, her blood pressure was consistently very normal typically in the 100s/60s.  Her father had a history of multiple strokes starting in his early 37s but lived to his 18s and died of a cerebral infarction.  He may have had high blood pressure.  Her mother and her siblings have been healthy and have not had high blood pressure.  She does not smoke.  She does not have diabetes mellitus.  Her most recent lipid profile showed total cholesterol 217, HDL 58, LDL 140, triglycerides 95.  Send labs showed normal TSH at 3.010.  Creatinine was 0.9.  In 2006 she had vertigo possible syncope and underwent extensive workup for cerebrovascular disease.  MRI raised concern for possible right vertebral basilar stenosis so she underwent invasive angiography invasive angiography of the carotids and vertebrals.  Findings were normal.  There was a possible venous angioma in the right frontal supraorbital area.  In 2017 she had mild right-sided hydronephrosis from an obstructive stone.  In 2020 she had evidence of descending colitis, felt  to be infectious.  Past Medical History:  Diagnosis Date   Acid reflux    Cancer (HCC)    precancerous cervix area   Depression    Hyperlipidemia    Hypertension     Past Surgical History:  Procedure Laterality Date   APPENDECTOMY     BREAST ENHANCEMENT SURGERY     CYSTOSCOPY WITH RETROGRADE PYELOGRAM, URETEROSCOPY AND STENT PLACEMENT Right 10/17/2016   Procedure: CYSTOSCOPY WITH RETROGRADE PYELOGRAM, URETEROSCOPY LASER LITHOTRIPSY AND STENT PLACEMENT;  Surgeon: Crist Fat, MD;  Location: [facility];  Service: Urology;  Laterality: Right;   DILATION AND CURETTAGE OF UTERUS     maxillofacial surgery     for TMJ   TONSILLECTOMY     UPPER GASTROINTESTINAL ENDOSCOPY  2002   Dr Bosie Clos    Current Medications: Current Meds  Medication Sig   ALPRAZolam (XANAX) 0.25 MG tablet Take 1 tablet (0.25 mg total) by mouth 2 (two) times daily as needed for anxiety.   Biotin 5 MG CAPS Take 1 capsule by mouth daily.   Calcium Citrate-Vitamin D 250-200 MG-UNIT TABS Take 1 tablet by mouth 2 (two) times daily.   Estradiol 4 MCG INST Imvexxy Maintenance Pack 4 mcg vaginal insert   fluticasone (FLONASE) 50 MCG/ACT nasal spray Place 2 sprays into both nostrils daily.   Glucosamine Sulfate 1000 MG CAPS Take 1 capsule by mouth daily.    losartan (COZAAR) 50 MG tablet Take 1 tablet (50 mg total) by mouth daily.   meclizine (ANTIVERT) 12.5 MG tablet Take 1 tablet (12.5 mg total) by mouth 3 (three) times daily as needed for dizziness.   Omega-3 Fatty Acids (FISH OIL PO) Take 2,000 mg by mouth 2 (two) times daily.   omeprazole (PRILOSEC) 20 MG capsule Take 20 mg by mouth 2 (two) times daily as needed.   valACYclovir (VALTREX) 1000 MG tablet Take 1 tablet by mouth twice a day for 3 days then as needed   zolpidem (AMBIEN CR) 6.25 MG CR tablet TAKE ONE TABLET BY MOUTH AT BEDTIME   [DISCONTINUED] losartan (COZAAR) 25 MG tablet Take 1 tablet (25 mg total) by mouth daily.     Allergies:   Augmentin  [amoxicillin-pot clavulanate], Codeine phosphate, Darvon [propoxyphene], Oxycodone-acetaminophen, Propoxyphene n-acetaminophen, and Sulfamethoxazole   Social History   Socioeconomic History   Marital status: Divorced    Spouse name: Not on file   Number of children: 0   Years of education: Not on file   Highest education level: Some college, no degree  Occupational History   Occupation: retired  Tobacco Use   Smoking status: Former    Packs/day: 0.30    Years: 4.00    Additional pack years: 0.00    Total pack years: 1.20    Types: Cigarettes    Quit date: 12/04/1992    Years since quitting: 30.3   Smokeless tobacco: Never  Substance and Sexual Activity   Alcohol use: No    Alcohol/week: 0.0 standard drinks of alcohol  Comment: quit 2.5 yrs ago   Drug use: No   Sexual activity: Not Currently    Comment: divorce  Other Topics Concern   Not on file  Social History Narrative   Not on file   Social Determinants of Health   Financial Resource Strain: Low Risk  (03/08/2023)   Overall Financial Resource Strain (CARDIA)    Difficulty of Paying Living Expenses: Not hard at all  Food Insecurity: No Food Insecurity (03/08/2023)   Hunger Vital Sign    Worried About Running Out of Food in the Last Year: Never true    Ran Out of Food in the Last Year: Never true  Transportation Needs: No Transportation Needs (03/08/2023)   PRAPARE - Administrator, Civil Service (Medical): No    Lack of Transportation (Non-Medical): No  Physical Activity: Insufficiently Active (03/08/2023)   Exercise Vital Sign    Days of Exercise per Week: 5 days    Minutes of Exercise per Session: 10 min  Stress: Stress Concern Present (03/08/2023)   [employer] of Occupational Health - Occupational Stress Questionnaire    Feeling of Stress : To some extent  Social Connections: Socially Isolated (03/08/2023)   Social Connection and Isolation Panel [NHANES]    Frequency of Communication with Friends and  Family: Three times a week    Frequency of Social Gatherings with Friends and Family: Never    Attends Religious Services: Never    Database administrator or Organizations: No    Attends Engineer, structural: Not on file    Marital Status: Divorced     Family History: The patient's family history includes Alzheimer's disease in her brother; Breast cancer in her maternal aunt; Cancer in her sister and sister; Diabetes in her brother; Emphysema in her mother; Heart disease in her father; Hyperlipidemia in her father; Hypertension in her father; Stroke in her brother; Stroke (age of onset: 72) in her father. There is no history of Colon cancer.  ROS:   Please see the history of present illness.     All other systems reviewed and are negative.  EKGs/Labs/Other Studies Reviewed:    The following studies were reviewed today: Cholesterol profile over the last several years CT, MRA, MRI, invasive angiography of the carotids and vertebrals from 2006 were reviewed  The CT of the abdomen and pelvis for suspected diverticulitis 2020 does not describe aortic or branch atherosclerosis and showed "negative adrenals".  There was no evidence of aortic aneurysm.   EKG:  EKG is  ordered today.  The ekg ordered today demonstrates normal sinus rhythm, normal tracing  Recent Labs: 03/26/2023: ALT 15; BUN 17; Creatinine, Ser 0.90; Hemoglobin 15.2; Platelets 320.0; Potassium 3.8; Sodium 139; TSH 3.01  Recent Lipid Panel    Component Value Date/Time   CHOL 217 (H) 03/26/2023 1000   TRIG 95.0 03/26/2023 1000   HDL 57.80 03/26/2023 1000   CHOLHDL 4 03/26/2023 1000   VLDL 19.0 03/26/2023 1000   LDLCALC 140 (H) 03/26/2023 1000     Risk Assessment/Calculations:                Physical Exam:    VS:  BP 134/86 (BP Location: Left Arm, Patient Position: Sitting, Cuff Size: Normal)   Pulse 62   Ht 5\' 4"  (1.626 m)   Wt 121 lb 6.4 oz (55.1 kg)   SpO2 99%   BMI 20.84 kg/m     Wt Readings  from Last 3 Encounters:  04/06/23  121 lb 6.4 oz (55.1 kg)  03/26/23 121 lb (54.9 kg)  03/09/23 125 lb (56.7 kg)     GEN: Very thin and fit appearing, younger than stated age, well nourished, well developed in no acute distress HEENT: Normal NECK: No JVD; No carotid bruits LYMPHATICS: No lymphadenopathy CARDIAC: RRR, no murmurs, rubs, gallops RESPIRATORY:  Clear to auscultation without rales, wheezing or rhonchi  ABDOMEN: Soft, non-tender, non-distended.  No abdominal bruit heard. MUSCULOSKELETAL:  No edema; No deformity  SKIN: Warm and dry NEUROLOGIC:  Alert and oriented x 3 PSYCHIATRIC:  Normal affect   ASSESSMENT:    1. Chest pain of uncertain etiology   2. Essential hypertension   3. Renal artery stenosis (HCC)   4. Primary hypertension    PLAN:    In order of problems listed above:  HTN: Unusual age of onset for essential hypertension, although statistically this remains the most likely diagnosis.  Will check for renal artery stenosis.  Note absence of visible atherosclerotic plaque on CT of the abdomen performed as recently as 2020.  Will also check plasma renin-aldosterone ratio.  Interpretation of this may be a little challenging since she is already taking angiotensin receptor blocker.  Subsequently will increase the losartan to 50 mg once daily.  We have discussed sodium restricted diet.  She mainly cooks for herself and eats a healthy diet.  Pheochromocytoma is considered since she has complaints of flushing and the blood pressure elevation can be episodically more severe.  We discussed the fact that this is a rare condition but we will pursue it if needed. HLP: Puts her at risk for developing atherosclerotic lesions including renal artery stenosis.  If we do not identify significant atherosclerosis, then she can continue with lifestyle changes for her mild hypercholesterolemia.  We discussed the fact that if we do identify atherosclerotic abnormalities, it would be worthwhile  restarting a statin.  She did very well on rosuvastatin in the past.           Medication Adjustments/Labs and Tests Ordered: Current medicines are reviewed at length with the patient today.  Concerns regarding medicines are outlined above.  Orders Placed This Encounter  Procedures   Aldosterone + renin activity w/ ratio   EKG 12-Lead   VAS US RENAL ARTERY DUPLEX   Meds ordered this encounter  Medications   losartan (COZAAR) 50 MG tablet    Sig: Take 1 tablet (50 mg total) by mouth daily.    Dispense:  90 tablet    Refill:  3    Patient Instructions  Medication Instructions:  Increase Losartan to 50 mg a day *If you need a refill on your cardiac medications before your next appointment, please call your pharmacy*   Lab Work: Plasma Renin/aldosterone If you have labs (blood work) drawn today and your tests are completely normal, you will receive your results only by: MyChart Message (if you have MyChart) OR A paper copy in the mail If you have any lab test that is abnormal or we need to change your treatment, we will call you to review the results.   Testing/Procedures: Your physician has requested that you have a renal artery duplex. During this test, an ultrasound is used to evaluate blood flow to the kidneys. Take your medications as you usually do. This will take place at [address] [facility], [address].  No food after 11PM the night before.  Water is OK. (Don't drink liquids if you have been instructed not to for ANOTHER  test). Avoid foods that produce bowel gas, for 24 hours prior to exam (see below). No breakfast, no chewing gum, no smoking or carbonated beverages. Patient may take morning medications with water. Come in for test at least 15 minutes early to register.    Follow-Up: At [facility], you and your health needs are our priority.  As part of our continuing mission to provide you with exceptional heart care, we have created designated Provider  Care Teams.  These Care Teams include your primary Cardiologist (physician) and Advanced Practice Providers (APPs -  Physician Assistants and Nurse Practitioners) who all work together to provide you with the care you need, when you need it.  We recommend signing up for the patient portal called "MyChart".  Sign up information is provided on this After Visit Summary.  MyChart is used to connect with patients for Virtual Visits (Telemedicine).  Patients are able to view lab/test results, encounter notes, upcoming appointments, etc.  Non-urgent messages can be sent to your provider as well.   To learn more about what you can do with MyChart, go to [email or web address].    Your next appointment:   3-4 month(s)  Provider:   Dr Royann Shivers  Other Instructions Keep BP log and send in 2 weeks    Signed, Thurmon Fair, MD  04/06/2023 10:24 AM    [facility]

## 2023-04-06 NOTE — Patient Instructions (Signed)
Medication Instructions:  Increase Losartan to 50 mg a day *If you need a refill on your cardiac medications before your next appointment, please call your pharmacy*   Lab Work: Plasma Renin/aldosterone If you have labs (blood work) drawn today and your tests are completely normal, you will receive your results only by: MyChart Message (if you have MyChart) OR A paper copy in the mail If you have any lab test that is abnormal or we need to change your treatment, we will call you to review the results.   Testing/Procedures: Your physician has requested that you have a renal artery duplex. During this test, an ultrasound is used to evaluate blood flow to the kidneys. Take your medications as you usually do. This will take place at [address] [facility], [address].  No food after 11PM the night before.  Water is OK. (Don't drink liquids if you have been instructed not to for ANOTHER test). Avoid foods that produce bowel gas, for 24 hours prior to exam (see below). No breakfast, no chewing gum, no smoking or carbonated beverages. Patient may take morning medications with water. Come in for test at least 15 minutes early to register.    Follow-Up: At [facility], you and your health needs are our priority.  As part of our continuing mission to provide you with exceptional heart care, we have created designated Provider Care Teams.  These Care Teams include your primary Cardiologist (physician) and Advanced Practice Providers (APPs -  Physician Assistants and Nurse Practitioners) who all work together to provide you with the care you need, when you need it.  We recommend signing up for the patient portal called "MyChart".  Sign up information is provided on this After Visit Summary.  MyChart is used to connect with patients for Virtual Visits (Telemedicine).  Patients are able to view lab/test results, encounter notes, upcoming appointments, etc.  Non-urgent messages can be sent to your  provider as well.   To learn more about what you can do with MyChart, go to [email or web address].    Your next appointment:   3-4 month(s)  Provider:   Dr Royann Shivers  Other Instructions Keep BP log and send in 2 weeks

## 2023-04-09 ENCOUNTER — Telehealth: Payer: Self-pay | Admitting: Emergency Medicine

## 2023-04-09 ENCOUNTER — Ambulatory Visit (HOSPITAL_COMMUNITY)
Admission: RE | Admit: 2023-04-09 | Discharge: 2023-04-09 | Disposition: A | Discharge status: Home / Self Care | Payer: Medicare Other | Source: Ambulatory Visit | Attending: Cardiology | Admitting: Cardiology

## 2023-04-09 DIAGNOSIS — I701 Atherosclerosis of renal artery: Secondary | ICD-10-CM | POA: Diagnosis not present

## 2023-04-09 NOTE — Telephone Encounter (Signed)
Called to give message/results; no answer, left message that results are normal. Results are available in MyChart as well.

## 2023-04-10 LAB — ALDOSTERONE + RENIN ACTIVITY W/ RATIO

## 2023-04-18 LAB — ALDOSTERONE + RENIN ACTIVITY W/ RATIO
Aldos/Renin Ratio: 1.4 (ref 0.0–30.0)
Renin Activity, Plasma: 2.859 ng/mL/hr (ref 0.167–5.380)

## 2023-04-23 ENCOUNTER — Ambulatory Visit: Payer: Medicare Other | Admitting: Internal Medicine

## 2023-04-25 ENCOUNTER — Encounter (HOSPITAL_COMMUNITY): Payer: Medicare Other

## 2023-05-03 ENCOUNTER — Other Ambulatory Visit: Payer: Self-pay | Admitting: Internal Medicine

## 2023-05-07 ENCOUNTER — Encounter: Payer: Self-pay | Admitting: Cardiovascular Disease

## 2023-05-21 ENCOUNTER — Other Ambulatory Visit: Payer: Self-pay | Admitting: Cardiovascular Disease

## 2023-05-21 ENCOUNTER — Encounter: Payer: Self-pay | Admitting: Cardiovascular Disease

## 2023-05-21 MED ORDER — LISINOPRIL 10 MG PO TABS: 10.0000 mg | ORAL_TABLET | Freq: Every day | ORAL | 3 refills | Status: DC

## 2023-05-21 NOTE — Progress Notes (Unsigned)
Reports knee pain worsening ever since she started losartan.  Blood pressure is now well-controlled on losartan 25 mg twice daily.  Will stop losartan and try lisinopril 10 mg once daily.

## 2023-05-30 DIAGNOSIS — M170 Bilateral primary osteoarthritis of knee: Secondary | ICD-10-CM | POA: Diagnosis not present

## 2023-05-31 NOTE — Telephone Encounter (Signed)
Well, then maybe the right choice is a longer lasting relative of losartan, such as irbesartan 150 mg daily. We can try that once the stomach issues resolve.

## 2023-06-04 ENCOUNTER — Telehealth: Payer: Self-pay

## 2023-06-04 MED ORDER — SUCRALFATE 1 G PO TABS: 1.0000 g | ORAL_TABLET | Freq: Three times a day (TID) | ORAL | 3 refills | Status: AC | PRN

## 2023-06-04 NOTE — Telephone Encounter (Signed)
It sounds like a higher dosage, but the drugs are not equivalent (the sose range for losartan is 25-100 mg, the dose range for irbesartan is 75-300 mg). Irbesartan 150 mg daily is roughly equivalent to a total of 50 mg of losartan, but has a better 4-h coverage profile. Please refill the sucralfate for 3 months.

## 2023-06-04 NOTE — Telephone Encounter (Signed)
Spoke with patient to clarify sucralfate dosage.  Patient taking 1 gm tablet by mouth TID PRN.  Will send this to pharmacy.  Also patient agreed to earlier appt.  Set for July 26 to follow up with BP and med changes

## 2023-06-08 ENCOUNTER — Telehealth: Payer: Self-pay

## 2023-06-08 MED ORDER — IRBESARTAN 150 MG PO TABS: 150.0000 mg | ORAL_TABLET | Freq: Every day | ORAL | 3 refills | Status: DC

## 2023-06-08 NOTE — Telephone Encounter (Signed)
RX for Irbesartan sent to pharmacy

## 2023-06-25 ENCOUNTER — Other Ambulatory Visit: Payer: Self-pay

## 2023-06-25 DIAGNOSIS — I10 Essential (primary) hypertension: Secondary | ICD-10-CM | POA: Diagnosis not present

## 2023-06-26 LAB — BASIC METABOLIC PANEL
BUN/Creatinine Ratio: 21 (ref 12–28)
BUN: 20 mg/dL (ref 8–27)
CO2: 25 mmol/L (ref 20–29)
Calcium: 10.3 mg/dL (ref 8.7–10.3)
Chloride: 103 mmol/L (ref 96–106)
Creatinine, Ser: 0.97 mg/dL (ref 0.57–1.00)
Glucose: 107 mg/dL — ABNORMAL HIGH (ref 70–99)
Potassium: 4.7 mmol/L (ref 3.5–5.2)
Sodium: 142 mmol/L (ref 134–144)
eGFR: 64 mL/min/{1.73_m2} (ref >59)

## 2023-06-29 ENCOUNTER — Ambulatory Visit: Payer: Medicare Other | Admitting: Cardiovascular Disease

## 2023-06-29 VITALS — BP 142/72 | HR 65 | Ht 64.0 in | Wt 118.4 lb

## 2023-06-29 DIAGNOSIS — E7800 Pure hypercholesterolemia, unspecified: Secondary | ICD-10-CM | POA: Diagnosis not present

## 2023-06-29 DIAGNOSIS — I10 Essential (primary) hypertension: Secondary | ICD-10-CM | POA: Diagnosis not present

## 2023-06-29 NOTE — Progress Notes (Signed)
Cardiology Office Note:    Date:  06/29/2023   ID:  [name], DOB 01-03-1956, MRN [id number]  PCP:  Myrlene Broker, MD   [facility] Providers Cardiologist:  None     Referring MD: Myrlene Broker, *   No chief complaint on file. [name] is a 67 y.o. female who is being seen today for the evaluation of recent onset hypertension at the request of Myrlene Broker, *.   History of Present Illness:    [name] is a 67 y.o. female with a hx of generally excellent health, moderate hypercholesterolemia, recently found to have symptomatic hypertension.  Workup for relatively late onset hypertension did not disclose evidence of renal artery stenosis or hyperaldosteronism.  Treatment with lisinopril reduce her blood pressure but also caused indigestion nausea and diarrhea.  After switching to irbesartan her blood pressure is very well-controlled and she has no side effects.  Her typical blood pressure at home is in the 120s/mid 60s.  She remains physically active and is very lean.  In fact has lost more weight and her BMI is now only 20.  She has not had dizziness, palpitations, syncope, edema, shortness of breath or chest pain.  She loves to work outside in the garden.  She does complain of muscle cramps at night and hair loss.  Follow-up lipid profile shows a total cholesterol 217, LDL 140, HDL 58, normal triglycerides.  In the past, her LDL was even higher to 162 and she took Vytorin for a while but this causes the knee discomfort.  In 2019 she was on rosuvastatin and her LDL was as low as 31, total cholesterol 99.   Her father had a history of multiple strokes starting in his early 37s but lived to his 75s and died of a cerebral infarction.  He may have had high blood pressure.  Her mother and her siblings have been healthy and have not had high blood pressure.  She does not smoke.  She does not have diabetes mellitus.  Her most recent lipid profile  showed total cholesterol 217, HDL 58, LDL 140, triglycerides 95.  Recent labs showed normal TSH at 3.010.  Creatinine was 0.9.  In 2006 she had vertigo possible syncope and underwent extensive workup for cerebrovascular disease.  MRI raised concern for possible right vertebral basilar stenosis so she underwent invasive angiography invasive angiography of the carotids and vertebrals.  Findings were normal.  There was a possible venous angioma in the right frontal supraorbital area.  In 2017 she had mild right-sided hydronephrosis from an obstructive stone.  In 2020 she had evidence of descending colitis, felt to be infectious.  Past Medical History:  Diagnosis Date   Acid reflux    Cancer (HCC)    precancerous cervix area   Depression    Hyperlipidemia    Hypertension     Past Surgical History:  Procedure Laterality Date   APPENDECTOMY     BREAST ENHANCEMENT SURGERY     CYSTOSCOPY WITH RETROGRADE PYELOGRAM, URETEROSCOPY AND STENT PLACEMENT Right 10/17/2016   Procedure: CYSTOSCOPY WITH RETROGRADE PYELOGRAM, URETEROSCOPY LASER LITHOTRIPSY AND STENT PLACEMENT;  Surgeon: Crist Fat, MD;  Location: [facility];  Service: Urology;  Laterality: Right;   DILATION AND CURETTAGE OF UTERUS     maxillofacial surgery     for TMJ   TONSILLECTOMY     UPPER GASTROINTESTINAL ENDOSCOPY  2002   Dr Bosie Clos    Current Medications: Current Meds  Medication Sig   ALPRAZolam (XANAX) 0.25 MG tablet Take 1 tablet (0.25 mg total) by mouth 2 (two) times daily as needed for anxiety.   Biotin 5 MG CAPS Take 1 capsule by mouth daily.   Calcium Citrate-Vitamin D 250-200 MG-UNIT TABS Take 1 tablet by mouth 2 (two) times daily.   Estradiol 4 MCG INST Imvexxy Maintenance Pack 4 mcg vaginal insert   fluticasone (FLONASE) 50 MCG/ACT nasal spray Place 2 sprays into both nostrils daily.   Glucosamine Sulfate 1000 MG CAPS Take 1 capsule by mouth daily.    irbesartan (AVAPRO) 150 MG tablet Take 1 tablet (150 mg  total) by mouth daily.   Omega-3 Fatty Acids (FISH OIL PO) Take 2,000 mg by mouth 2 (two) times daily.   omeprazole (PRILOSEC) 20 MG capsule Take 20 mg by mouth 2 (two) times daily as needed.   sucralfate (CARAFATE) 1 g tablet Take 1 tablet (1 g total) by mouth 3 (three) times daily as needed.   valACYclovir (VALTREX) 1000 MG tablet Take 1 tablet by mouth twice a day for 3 days then as needed   zolpidem (AMBIEN CR) 6.25 MG CR tablet TAKE ONE TABLET BY MOUTH AT BEDTIME     Allergies:   Augmentin [amoxicillin-pot clavulanate], Codeine phosphate, Darvon [propoxyphene], Oxycodone-acetaminophen, Propoxyphene n-acetaminophen, and Sulfamethoxazole   Social History   Socioeconomic History   Marital status: Divorced    Spouse name: Not on file   Number of children: 0   Years of education: Not on file   Highest education level: Some college, no degree  Occupational History   Occupation: retired  Tobacco Use   Smoking status: Former    Current packs/day: 0.00    Average packs/day: 0.3 packs/day for 4.0 years (1.2 ttl pk-yrs)    Types: Cigarettes    Start date: 12/04/1988    Quit date: 12/04/1992    Years since quitting: 30.5   Smokeless tobacco: Never  Substance and Sexual Activity   Alcohol use: No    Alcohol/week: 0.0 standard drinks of alcohol    Comment: quit 2.5 yrs ago   Drug use: No   Sexual activity: Not Currently    Comment: divorce  Other Topics Concern   Not on file  Social History Narrative   Not on file   Social Determinants of Health   Financial Resource Strain: Low Risk  (03/08/2023)   Overall Financial Resource Strain (CARDIA)    Difficulty of Paying Living Expenses: Not hard at all  Food Insecurity: No Food Insecurity (03/08/2023)   Hunger Vital Sign    Worried About Running Out of Food in the Last Year: Never true    Ran Out of Food in the Last Year: Never true  Transportation Needs: No Transportation Needs (03/08/2023)   PRAPARE - Scientist, research (physical sciences) (Medical): No    Lack of Transportation (Non-Medical): No  Physical Activity: Insufficiently Active (03/08/2023)   Exercise Vital Sign    Days of Exercise per Week: 5 days    Minutes of Exercise per Session: 10 min  Stress: Stress Concern Present (03/08/2023)   [employer] of Occupational Health - Occupational Stress Questionnaire    Feeling of Stress : To some extent  Social Connections: Socially Isolated (03/08/2023)   Social Connection and Isolation Panel [NHANES]    Frequency of Communication with Friends and Family: Three times a week    Frequency of Social Gatherings with Friends and Family: Never    Attends Religious Services:  Never    Active Member of Clubs or Organizations: No    Attends Banker Meetings: Not on file    Marital Status: Divorced     Family History: The patient's family history includes Alzheimer's disease in her brother; Breast cancer in her maternal aunt; Cancer in her sister and sister; Diabetes in her brother; Emphysema in her mother; Heart disease in her father; Hyperlipidemia in her father; Hypertension in her father; Stroke in her brother; Stroke (age of onset: 47) in her father. There is no history of Colon cancer.  ROS:   Please see the history of present illness.     All other systems reviewed and are negative.  EKGs/Labs/Other Studies Reviewed:    The following studies were reviewed today:  Renal duplex ultrasound 04/09/2023 without significant renal artery stenosis. Cholesterol profile over the last several years CT, MRA, MRI, invasive angiography of the carotids and vertebrals from 2006 were reviewed  The CT of the abdomen and pelvis for suspected diverticulitis 2020 does not describe aortic or branch atherosclerosis and showed "negative adrenals".  There was no evidence of aortic aneurysm.   EKG:  EKG is  ordered today.  The ekg ordered today demonstrates normal sinus rhythm, normal tracing  Recent  Labs: 03/26/2023: ALT 15; Hemoglobin 15.2; Platelets 320.0; TSH 3.01 06/25/2023: BUN 20; Creatinine, Ser 0.97; Potassium 4.7; Sodium 142  Recent Lipid Panel    Component Value Date/Time   CHOL 217 (H) 03/26/2023 1000   TRIG 95.0 03/26/2023 1000   HDL 57.80 03/26/2023 1000   CHOLHDL 4 03/26/2023 1000   VLDL 19.0 03/26/2023 1000   LDLCALC 140 (H) 03/26/2023 1000     Risk Assessment/Calculations:      HYPERTENSION CONTROL Vitals:   06/29/23 0838 06/29/23 0900  BP: (!) 142/72 (!) 142/72    The patient's blood pressure is elevated above target today.  In order to address the patient's elevated BP: The blood pressure is usually elevated in clinic.  Blood pressures monitored at home have been optimal.            Physical Exam:    VS:  BP (!) 142/72   Pulse 65   Ht 5\' 4"  (1.626 m)   Wt 118 lb 6.4 oz (53.7 kg)   SpO2 99%   BMI 20.32 kg/m     Wt Readings from Last 3 Encounters:  06/29/23 118 lb 6.4 oz (53.7 kg)  04/06/23 121 lb 6.4 oz (55.1 kg)  03/26/23 121 lb (54.9 kg)     GEN: Very thin and fit appearing, younger than stated age, well nourished, well developed in no acute distress HEENT: Normal NECK: No JVD; No carotid bruits LYMPHATICS: No lymphadenopathy CARDIAC: RRR, no murmurs, rubs, gallops RESPIRATORY:  Clear to auscultation without rales, wheezing or rhonchi  ABDOMEN: Soft, non-tender, non-distended.  No abdominal bruit heard. MUSCULOSKELETAL:  No edema; No deformity  SKIN: Warm and dry NEUROLOGIC:  Alert and oriented x 3 PSYCHIATRIC:  Normal affect   ASSESSMENT:    1. Essential hypertension   2. Hypercholesterolemia     PLAN:    In order of problems listed above:  HTN: Well-controlled on a moderate dose of irbesartan monotherapy.  No reason to perform further evaluation for secondary causes of hypertension at this point.  Normal renal function. HLP: Recommended a coronary calcium score to see if we need to treat this long-term.            Medication Adjustments/Labs and Tests Ordered: Current medicines  are reviewed at length with the patient today.  Concerns regarding medicines are outlined above.  Orders Placed This Encounter  Procedures   CT CARDIAC SCORING (SELF PAY ONLY)   No orders of the defined types were placed in this encounter.   Patient Instructions  Medication Instructions:  No changes *If you need a refill on your cardiac medications before your next appointment, please call your pharmacy*  Testing/Procedures: Dr Royann Shivers has ordered a CT coronary calcium score.   Test locations:  [facility]  [facility]  This is $99 out of pocket.   Coronary CalciumScan A coronary calcium scan is an imaging test used to look for deposits of calcium and other fatty materials (plaques) in the inner lining of the blood vessels of the heart (coronary arteries). These deposits of calcium and plaques can partly clog and narrow the coronary arteries without producing any symptoms or warning signs. This puts a person at risk for a heart attack. This test can detect these deposits before symptoms develop. Tell a health care provider about: Any allergies you have. All medicines you are taking, including vitamins, herbs, eye drops, creams, and over-the-counter medicines. Any problems you or family members have had with anesthetic medicines. Any blood disorders you have. Any surgeries you have had. Any medical conditions you have. Whether you are pregnant or may be pregnant. What are the risks? Generally, this is a safe procedure. However, problems may occur, including: Harm to a pregnant woman and her unborn baby. This test involves the use of radiation. Radiation exposure can be dangerous to a pregnant woman and her unborn baby. If you are pregnant, you generally should not have this procedure done. Slight increase in the risk  of cancer. This is because of the radiation involved in the test. What happens before the procedure? No preparation is needed for this procedure. What happens during the procedure? You will undress and remove any jewelry around your neck or chest. You will put on a hospital gown. Sticky electrodes will be placed on your chest. The electrodes will be connected to an electrocardiogram (ECG) machine to record a tracing of the electrical activity of your heart. A CT scanner will take pictures of your heart. During this time, you will be asked to lie still and hold your breath for 2-3 seconds while a picture of your heart is being taken. The procedure may vary among health care providers and hospitals. What happens after the procedure? You can get dressed. You can return to your normal activities. It is up to you to get the results of your test. Ask your health care provider, or the department that is doing the test, when your results will be ready. Summary A coronary calcium scan is an imaging test used to look for deposits of calcium and other fatty materials (plaques) in the inner lining of the blood vessels of the heart (coronary arteries). Generally, this is a safe procedure. Tell your health care provider if you are pregnant or may be pregnant. No preparation is needed for this procedure. A CT scanner will take pictures of your heart. You can return to your normal activities after the scan is done. This information is not intended to replace advice given to you by your health care provider. Make sure you discuss any questions you have with your health care provider. Document Released: 05/18/2008 Document Revised: 10/09/2016 Document Reviewed: 10/09/2016 Elsevier Interactive Patient Education  2017 Elsevier Inc.    Follow-Up: At [facility], you and your health needs are our priority.  As part of our continuing mission to provide you with exceptional heart care, we have created  designated Provider Care Teams.  These Care Teams include your primary Cardiologist (physician) and Advanced Practice Providers (APPs -  Physician Assistants and Nurse Practitioners) who all work together to provide you with the care you need, when you need it.  We recommend signing up for the patient portal called "MyChart".  Sign up information is provided on this After Visit Summary.  MyChart is used to connect with patients for Virtual Visits (Telemedicine).  Patients are able to view lab/test results, encounter notes, upcoming appointments, etc.  Non-urgent messages can be sent to your provider as well.   To learn more about what you can do with MyChart, go to [email or web address].    Your next appointment:   1 year(s)  Provider:   Dr Royann Shivers    Signed, Thurmon Fair, MD  06/29/2023 9:01 AM    [facility]

## 2023-06-29 NOTE — Patient Instructions (Signed)
Medication Instructions:  No changes *If you need a refill on your cardiac medications before your next appointment, please call your pharmacy*  Testing/Procedures: Dr Royann Shivers has ordered a CT coronary calcium score.   Test locations:  [facility]  [facility]  This is $99 out of pocket.   Coronary CalciumScan A coronary calcium scan is an imaging test used to look for deposits of calcium and other fatty materials (plaques) in the inner lining of the blood vessels of the heart (coronary arteries). These deposits of calcium and plaques can partly clog and narrow the coronary arteries without producing any symptoms or warning signs. This puts a person at risk for a heart attack. This test can detect these deposits before symptoms develop. Tell a health care provider about: Any allergies you have. All medicines you are taking, including vitamins, herbs, eye drops, creams, and over-the-counter medicines. Any problems you or family members have had with anesthetic medicines. Any blood disorders you have. Any surgeries you have had. Any medical conditions you have. Whether you are pregnant or may be pregnant. What are the risks? Generally, this is a safe procedure. However, problems may occur, including: Harm to a pregnant woman and her unborn baby. This test involves the use of radiation. Radiation exposure can be dangerous to a pregnant woman and her unborn baby. If you are pregnant, you generally should not have this procedure done. Slight increase in the risk of cancer. This is because of the radiation involved in the test. What happens before the procedure? No preparation is needed for this procedure. What happens during the procedure? You will undress and remove any jewelry around your neck or chest. You will put on a hospital gown. Sticky electrodes will be placed on your chest. The  electrodes will be connected to an electrocardiogram (ECG) machine to record a tracing of the electrical activity of your heart. A CT scanner will take pictures of your heart. During this time, you will be asked to lie still and hold your breath for 2-3 seconds while a picture of your heart is being taken. The procedure may vary among health care providers and hospitals. What happens after the procedure? You can get dressed. You can return to your normal activities. It is up to you to get the results of your test. Ask your health care provider, or the department that is doing the test, when your results will be ready. Summary A coronary calcium scan is an imaging test used to look for deposits of calcium and other fatty materials (plaques) in the inner lining of the blood vessels of the heart (coronary arteries). Generally, this is a safe procedure. Tell your health care provider if you are pregnant or may be pregnant. No preparation is needed for this procedure. A CT scanner will take pictures of your heart. You can return to your normal activities after the scan is done. This information is not intended to replace advice given to you by your health care provider. Make sure you discuss any questions you have with your health care provider. Document Released: 05/18/2008 Document Revised: 10/09/2016 Document Reviewed: 10/09/2016 Elsevier Interactive Patient Education  2017 [employer].    Follow-Up: At [facility], you and your health needs are our priority.  As part of our continuing mission to provide you with exceptional heart care, we have created designated Provider Care Teams.  These Care Teams include your primary Cardiologist (  physician) and Advanced Practice Providers (APPs -  Physician Assistants and Nurse Practitioners) who all work together to provide you with the care you need, when you need it.  We recommend signing up for the patient portal called "MyChart".  Sign up  information is provided on this After Visit Summary.  MyChart is used to connect with patients for Virtual Visits (Telemedicine).  Patients are able to view lab/test results, encounter notes, upcoming appointments, etc.  Non-urgent messages can be sent to your provider as well.   To learn more about what you can do with MyChart, go to [email or web address].    Your next appointment:   1 year(s)  Provider:   Dr Royann Shivers

## 2023-08-30 DIAGNOSIS — M816 Localized osteoporosis [Lequesne]: Secondary | ICD-10-CM | POA: Diagnosis not present

## 2023-08-30 DIAGNOSIS — Z6820 Body mass index (BMI) 20.0-20.9, adult: Secondary | ICD-10-CM | POA: Diagnosis not present

## 2023-09-03 ENCOUNTER — Ambulatory Visit: Payer: Medicare Other | Admitting: Cardiovascular Disease

## 2023-10-03 ENCOUNTER — Other Ambulatory Visit: Payer: Self-pay

## 2023-10-03 ENCOUNTER — Emergency Department (HOSPITAL_COMMUNITY): Payer: Medicare Other

## 2023-10-03 ENCOUNTER — Ambulatory Visit (INDEPENDENT_AMBULATORY_CARE_PROVIDER_SITE_OTHER): Payer: Medicare Other | Admitting: Nurse Practitioner

## 2023-10-03 ENCOUNTER — Encounter (HOSPITAL_COMMUNITY): Payer: Self-pay

## 2023-10-03 ENCOUNTER — Emergency Department (HOSPITAL_COMMUNITY)
Admission: EM | Admit: 2023-10-03 | Discharge: 2023-10-03 | Disposition: A | Discharge status: Home / Self Care | Payer: Medicare Other | Attending: Emergency Medicine | Admitting: Emergency Medicine

## 2023-10-03 VITALS — BP 120/82 | HR 83 | Temp 98.3°F | Ht 64.0 in | Wt 122.0 lb

## 2023-10-03 DIAGNOSIS — R55 Syncope and collapse: Secondary | ICD-10-CM | POA: Insufficient documentation

## 2023-10-03 DIAGNOSIS — R002 Palpitations: Secondary | ICD-10-CM | POA: Diagnosis not present

## 2023-10-03 DIAGNOSIS — G4700 Insomnia, unspecified: Secondary | ICD-10-CM | POA: Diagnosis not present

## 2023-10-03 DIAGNOSIS — R110 Nausea: Secondary | ICD-10-CM | POA: Diagnosis not present

## 2023-10-03 DIAGNOSIS — R42 Dizziness and giddiness: Secondary | ICD-10-CM | POA: Insufficient documentation

## 2023-10-03 DIAGNOSIS — R030 Elevated blood-pressure reading, without diagnosis of hypertension: Secondary | ICD-10-CM | POA: Diagnosis not present

## 2023-10-03 LAB — BASIC METABOLIC PANEL
Anion gap: 11 (ref 5–15)
BUN: 18 mg/dL (ref 8–23)
CO2: 23 mmol/L (ref 22–32)
Calcium: 9.4 mg/dL (ref 8.9–10.3)
Chloride: 108 mmol/L (ref 98–111)
Creatinine, Ser: 0.91 mg/dL (ref 0.44–1.00)
GFR, Estimated: >60 mL/min (ref >60)
Glucose, Bld: 117 mg/dL — ABNORMAL HIGH (ref 70–99)
Potassium: 3.6 mmol/L (ref 3.5–5.1)
Sodium: 142 mmol/L (ref 135–145)

## 2023-10-03 LAB — CBC
HCT: 38.6 % (ref 36.0–46.0)
Hemoglobin: 13.3 g/dL (ref 12.0–15.0)
MCH: 29.8 pg (ref 26.0–34.0)
MCHC: 34.5 g/dL (ref 30.0–36.0)
MCV: 86.5 fL (ref 80.0–100.0)
Platelets: 293 10*3/uL (ref 150–400)
RBC: 4.46 MIL/uL (ref 3.87–5.11)
RDW: 12.6 % (ref 11.5–15.5)
WBC: 6.8 10*3/uL (ref 4.0–10.5)
nRBC: 0 % (ref 0.0–0.2)

## 2023-10-03 LAB — TROPONIN I (HIGH SENSITIVITY)
Troponin I (High Sensitivity): 4 ng/L (ref <18)
Troponin I (High Sensitivity): 6 ng/L (ref <18)

## 2023-10-03 MED ORDER — AMBIEN CR 6.25 MG PO TBCR: 6.2500 mg | EXTENDED_RELEASE_TABLET | Freq: Every evening | ORAL | 0 refills | Status: DC | PRN

## 2023-10-03 NOTE — ED Triage Notes (Signed)
Patient arrived POV from home with complaint of palpitations, dizziness, nausea, bilateral intermittent hand numbness.   Reports symptoms starting today.

## 2023-10-03 NOTE — Progress Notes (Signed)
Established Patient Office Visit  Subjective   Patient ID: [name], female    DOB: 1956-01-23  Age: 67 y.o. MRN: [id number]  Chief Complaint  Patient presents with   Insomnia    Having difficulty sleeping and also sleeping in and out     Patient has today for acute visit for the above.  She reports that she was in the emergency department earlier today for complaints of palpitations.  She underwent evaluation which identified normal troponins, no anemia, stable metabolic panel.  EKG was also completed which identified normal sinus rhythm.    They recommended she have outpatient cardiac monitoring to rule out arrhythmia, and told her that due to her chronic insomnia she should follow-up with her PCP to discuss treatment options to see if better sleep could improve her symptoms.  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  She reports getting about 2 hours of sleep per night and this has been ongoing for a few months.  She reports that she has tried melatonin and Lunesta in the past without improvement in her insomnia.  She currently is on Ambien 6.25 mg tablet, she was taking 1/2 tablet at night without improvement in sleep.  She also takes Xanax as needed for panic attacks but reports this is very seldom.  She reports that last time she filled her Ambien the manufacture changed and the tablets looked different.  She reports that she did not tolerate these tablets as well as the previous ones.   She denies excessive caffeine intake, and reports no caffeine intake.  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  She does not want to have heart monitor prescribed by [place] today.    Review of Systems  Constitutional:  Positive for malaise/fatigue.  Psychiatric/Behavioral:  Negative for suicidal ideas. The  patient has insomnia.       Objective:     BP 120/82   Pulse 83   Temp 98.3 F (36.8 C) (Temporal)   Ht 5\' 4"  (1.626 m)   Wt 122 lb (55.3 kg)   BMI 20.94 kg/m    Physical Exam Vitals reviewed.  Constitutional:      General: She is not in acute distress.    Appearance: Normal appearance.  HENT:     Head: Normocephalic and atraumatic.  Cardiovascular:     Rate and Rhythm: Normal rate and regular rhythm.     Pulses: Normal pulses.     Heart sounds: Normal heart sounds.  Pulmonary:     Effort: Pulmonary effort is normal.     Breath sounds: Normal breath sounds.  Skin:    General: Skin is warm and dry.  Neurological:     General: No focal deficit present.     Mental Status: She is alert and oriented to person, place, and time.  Psychiatric:        Mood and Affect: Mood normal.        Behavior: Behavior normal.        Judgment: Judgment normal.      Results for orders placed or performed during the hospital encounter of 10/03/23  Basic metabolic panel  Result Value Ref Range   Sodium 142 135 - 145 mmol/L   Potassium 3.6 3.5 - 5.1 mmol/L   Chloride  108 98 - 111 mmol/L   CO2 23 22 - 32 mmol/L   Glucose, Bld 117 (H) 70 - 99 mg/dL   BUN 18 8 - 23 mg/dL   Creatinine, Ser 4.09 0.44 - 1.00 mg/dL   Calcium 9.4 8.9 - 81.1 mg/dL   GFR, Estimated >91 >47 mL/min   Anion gap 11 5 - 15  CBC  Result Value Ref Range   WBC 6.8 4.0 - 10.5 K/uL   RBC 4.46 3.87 - 5.11 MIL/uL   Hemoglobin 13.3 12.0 - 15.0 g/dL   HCT 82.9 56.2 - 13.0 %   MCV 86.5 80.0 - 100.0 fL   MCH 29.8 26.0 - 34.0 pg   MCHC 34.5 30.0 - 36.0 g/dL   RDW 86.5 78.4 - 69.6 %   Platelets 293 150 - 400 K/uL   nRBC 0.0 0.0 - 0.2 %  Troponin I (High Sensitivity)  Result Value Ref Range   Troponin I (High Sensitivity) 4 <18 ng/L  Troponin I (High Sensitivity)  Result Value Ref Range   Troponin I (High Sensitivity) 6 <18 ng/L      The 10-year ASCVD risk score (Arnett DK, et al., 2019) is: 8.3%     Assessment & Plan:   Problem List Items Addressed This Visit       Other   Insomnia - Primary    Chronic, not well-controlled We discussed sleep hygiene techniques specifically avoidance of screens 60 to 90 minutes before bedtime, having a scheduled sleep and rise time, avoiding caffeine in the afternoon, avoiding exercising right before bedtime, participating in a simple activity that can help her calm down before bedtime as a routine.  We also discussed the role of counseling services which she has declined at this time.  We discussed different treatment options with pharmacological therapy.  Per shared decision making she would like to try tradename Ambien 6.25mg  at bedtime as needed and knows to avoid taking Xanax with Ambien at the same time.  She was advised to follow-up with cardiology to undergo outpatient cardiac monitor and to follow-up with her PCP in approximately 1 month to see how her lifestyle changes in addition to taking Ambien has helped with her insomnia.  She reports understanding.      Relevant Medications   AMBIEN CR 6.25 MG CR tablet    Return in about 1 month (around 11/03/2023) for F/U with Phung Kotas.    Elenore Paddy, NP

## 2023-10-03 NOTE — Assessment & Plan Note (Signed)
Chronic, not well-controlled We discussed sleep hygiene techniques specifically avoidance of screens 60 to 90 minutes before bedtime, having a scheduled sleep and rise time, avoiding caffeine in the afternoon, avoiding exercising right before bedtime, participating in a simple activity that can help her calm down before bedtime as a routine.  We also discussed the role of counseling services which she has declined at this time.  We discussed different treatment options with pharmacological therapy.  Per shared decision making she would like to try tradename Ambien 6.25mg  at bedtime as needed and knows to avoid taking Xanax with Ambien at the same time.  She was advised to follow-up with cardiology to undergo outpatient cardiac monitor and to follow-up with her PCP in approximately 1 month to see how her lifestyle changes in addition to taking Ambien has helped with her insomnia.  She reports understanding.

## 2023-10-03 NOTE — ED Provider Notes (Signed)
[place] EMERGENCY DEPARTMENT AT [facility] Provider Note   CSN: [id number] Arrival date & time: 10/03/23  0125     History  Chief Complaint  Patient presents with   Palpitations    [name] is a 67 y.o. female.  Patient here after episode of palpitations and dizziness and nausea.  Symptoms recurred earlier this evening.  Now resolved.  She is feeling better.  She felt little bit off yesterday.  She denies any speech changes, vision loss, weakness.  She felt some tingling in her hands during this episode but denies any sensation changes otherwise.  No headache fever or chills.  No chest pain or shortness of breath.  No ringing or ears.  No new medications.  Patient has a history of high cholesterol, hypertension, depression.  She says she has not been sleeping well.  She did not lose consciousness.  She did not hit her head.  She not having any extremity pain.  The history is provided by the patient.       Home Medications Prior to Admission medications   Medication Sig Start Date End Date Taking? Authorizing Provider  ALPRAZolam (XANAX) 0.25 MG tablet Take 1 tablet (0.25 mg total) by mouth 2 (two) times daily as needed for anxiety. 03/29/21   Myrlene Broker, MD  Biotin 5 MG CAPS Take 1 capsule by mouth daily.    [provider]  Calcium Citrate-Vitamin D 250-200 MG-UNIT TABS Take 1 tablet by mouth 2 (two) times daily.    [provider]  Estradiol 4 MCG INST Imvexxy Maintenance Pack 4 mcg vaginal insert    [provider]  fluticasone (FLONASE) 50 MCG/ACT nasal spray Place 2 sprays into both nostrils daily. 08/30/22   Waldon Merl, PA-C  Glucosamine Sulfate 1000 MG CAPS Take 1 capsule by mouth daily.     [provider]  irbesartan (AVAPRO) 150 MG tablet Take 1 tablet (150 mg total) by mouth daily. 06/08/23   Croitoru, Mihai, MD  Omega-3 Fatty Acids (FISH OIL PO) Take 2,000 mg by mouth 2 (two) times daily.     [provider]  omeprazole (PRILOSEC) 20 MG capsule Take 20 mg by mouth 2 (two) times daily as needed.    [provider]  sucralfate (CARAFATE) 1 g tablet Take 1 tablet (1 g total) by mouth 3 (three) times daily as needed. 06/04/23   Croitoru, Mihai, MD  valACYclovir (VALTREX) 1000 MG tablet Take 1 tablet by mouth twice a day for 3 days then as needed 05/18/22     zolpidem (AMBIEN CR) 6.25 MG CR tablet TAKE ONE TABLET BY MOUTH AT BEDTIME 05/03/23   Myrlene Broker, MD      Allergies    Augmentin [amoxicillin-pot clavulanate], Codeine phosphate, Darvon [propoxyphene], Oxycodone-acetaminophen, Propoxyphene n-acetaminophen, and Sulfamethoxazole    Review of Systems   Review of Systems  Physical Exam Updated Vital Signs BP (!) 164/86   Pulse 73   Temp 98.2 F (36.8 C) (Oral)   Resp 18   SpO2 100%  Physical Exam Vitals and nursing note reviewed.  Constitutional:      General: She is not in acute distress.    Appearance: She is well-developed. She is not ill-appearing.  HENT:     Head: Normocephalic and atraumatic.     Nose: Nose normal.     Mouth/Throat:     Mouth: Mucous membranes are moist.  Eyes:     Extraocular Movements: Extraocular movements intact.  Conjunctiva/sclera: Conjunctivae normal.     Pupils: Pupils are equal, round, and reactive to light.  Cardiovascular:     Rate and Rhythm: Normal rate and regular rhythm.     Pulses: Normal pulses.     Heart sounds: Normal heart sounds. No murmur heard. Pulmonary:     Effort: Pulmonary effort is normal. No respiratory distress.     Breath sounds: Normal breath sounds.  Abdominal:     Palpations: Abdomen is soft.     Tenderness: There is no abdominal tenderness.  Musculoskeletal:        General: No swelling or tenderness. Normal range of motion.     Cervical back: Normal range of motion and neck supple.  Skin:    General: Skin is warm and dry.     Capillary Refill: Capillary refill takes less  than 2 seconds.  Neurological:     General: No focal deficit present.     Mental Status: She is alert and oriented to person, place, and time.     Cranial Nerves: No cranial nerve deficit.     Sensory: No sensory deficit.     Motor: No weakness.     Coordination: Coordination normal.     Gait: Gait normal.     Comments: 5+ out of 5 strength throughout, normal sensation, no drift, normal finger-to-nose finger, normal speech  Psychiatric:        Mood and Affect: Mood normal.     ED Results / Procedures / Treatments   Labs (all labs ordered are listed, but only abnormal results are displayed) Labs Reviewed  BASIC METABOLIC PANEL - Abnormal; Notable for the following components:      Result Value   Glucose, Bld 117 (*)    All other components within normal limits  CBC  TROPONIN I (HIGH SENSITIVITY)  TROPONIN I (HIGH SENSITIVITY)    EKG EKG Interpretation Date/Time:  Wednesday October 03 2023 01:20:36 EDT Ventricular Rate:  81 PR Interval:  130 QRS Duration:  78 QT Interval:  382 QTC Calculation: 443 R Axis:   84  Text Interpretation: Normal sinus rhythm Cannot rule out Anterior infarct , age undetermined Abnormal ECG When compared with ECG of 10-Jul-2019 17:19, PREVIOUS ECG IS PRESENT No significant change was found Confirmed by Glynn Octave [phone]) on 10/03/2023 6:01:08 AM  Radiology DG Chest 2 View  Result Date: 10/03/2023 CLINICAL DATA:  Palpitations.  High blood pressure for 1 hour EXAM: CHEST - 2 VIEW COMPARISON:  04/18/2022 FINDINGS: Normal heart size and mediastinal contours. No acute infiltrate or edema. No effusion or pneumothorax. No acute osseous findings. IMPRESSION: No active cardiopulmonary disease. Electronically Signed   By: Tiburcio Pea M.D.   On: 10/03/2023 04:34    Procedures Procedures    Medications Ordered in ED Medications - No data to display  ED Course/ Medical Decision Making/ A&P                                 Medical Decision  Making Amount and/or Complexity of Data Reviewed Labs: ordered. Radiology: ordered.   [name] is here with palpitations and near syncope.  Unremarkable vitals.  No fever.  Differential diagnosis could be PACs or PVCs or paroxysmal A-fib flutter versus metabolic process or electrolyte abnormality or anemia seems less likely to be ACS.  Have no concern for stroke.  Could be a peripheral vertigo.  Could be stress related.  She had  near syncopal episode earlier tonight with palpitations and uneasy feeling for a while that passed on its own.  EKG shows sinus rhythm.  No ischemic changes per my review interpretation.  She is already had blood work done prior to my evaluation including CBC BMP chest x-ray troponins.  Per my review and interpretation of labs no significant anemia or electrolyte abnormality or kidney injury or leukocytosis.  No pneumonia or pneumothorax on chest x-ray per my review and interpretation.  Troponin is negative x 2.  She has a normal neurological exam.  Stable to ambulate without any issues.  Overall she did not lose consciousness.  She is got hypertension history but blood pressures are reassuring here.  Overall we will have her follow-up with her primary care doctor and cardiology.  She might benefit from Holter monitor.  Told to return if symptoms worsen.  Discharged in good condition.  Stands return precautions.  This chart was dictated using voice recognition software.  Despite best efforts to proofread,  errors can occur which can change the documentation meaning.         Final Clinical Impression(s) / ED Diagnoses Final diagnoses:  Palpitations  Near syncope    Rx / DC Orders ED Discharge Orders          Ordered    Ambulatory referral to Cardiology       Comments: If you have not heard from the Cardiology office within the next 72 hours please call [phone].   10/03/23 0743              Virgina Norfolk, DO 10/03/23 0745

## 2023-10-08 ENCOUNTER — Encounter: Payer: Self-pay | Admitting: Cardiovascular Disease

## 2023-10-08 ENCOUNTER — Encounter: Payer: Self-pay | Admitting: Internal Medicine

## 2023-10-18 ENCOUNTER — Other Ambulatory Visit (HOSPITAL_COMMUNITY): Payer: Self-pay

## 2023-10-19 ENCOUNTER — Other Ambulatory Visit (HOSPITAL_COMMUNITY): Payer: Self-pay

## 2023-11-08 DIAGNOSIS — M8580 Other specified disorders of bone density and structure, unspecified site: Secondary | ICD-10-CM | POA: Diagnosis not present

## 2023-11-15 DIAGNOSIS — M810 Age-related osteoporosis without current pathological fracture: Secondary | ICD-10-CM | POA: Diagnosis not present

## 2023-11-16 ENCOUNTER — Encounter: Payer: Self-pay | Admitting: Cardiovascular Disease

## 2023-12-08 ENCOUNTER — Emergency Department (HOSPITAL_COMMUNITY)
Admission: EM | Admit: 2023-12-08 | Discharge: 2023-12-08 | Disposition: A | Discharge status: Home / Self Care | Payer: Medicare Other | Attending: Emergency Medicine | Admitting: Emergency Medicine

## 2023-12-08 ENCOUNTER — Encounter (HOSPITAL_COMMUNITY): Payer: Self-pay | Admitting: Emergency Medicine

## 2023-12-08 DIAGNOSIS — Z20822 Contact with and (suspected) exposure to covid-19: Secondary | ICD-10-CM | POA: Insufficient documentation

## 2023-12-08 DIAGNOSIS — E876 Hypokalemia: Secondary | ICD-10-CM | POA: Insufficient documentation

## 2023-12-08 DIAGNOSIS — K529 Noninfective gastroenteritis and colitis, unspecified: Secondary | ICD-10-CM | POA: Insufficient documentation

## 2023-12-08 DIAGNOSIS — I10 Essential (primary) hypertension: Secondary | ICD-10-CM | POA: Insufficient documentation

## 2023-12-08 DIAGNOSIS — E785 Hyperlipidemia, unspecified: Secondary | ICD-10-CM | POA: Insufficient documentation

## 2023-12-08 DIAGNOSIS — R197 Diarrhea, unspecified: Secondary | ICD-10-CM | POA: Diagnosis present

## 2023-12-08 DIAGNOSIS — F32A Depression, unspecified: Secondary | ICD-10-CM | POA: Diagnosis not present

## 2023-12-08 DIAGNOSIS — Z79899 Other long term (current) drug therapy: Secondary | ICD-10-CM | POA: Insufficient documentation

## 2023-12-08 LAB — RESP PANEL BY RT-PCR (RSV, FLU A&B, COVID)  RVPGX2
Influenza A by PCR: NEGATIVE
Influenza B by PCR: NEGATIVE
Resp Syncytial Virus by PCR: NEGATIVE
SARS Coronavirus 2 by RT PCR: NEGATIVE

## 2023-12-08 LAB — CBC WITH DIFFERENTIAL/PLATELET
Abs Immature Granulocytes: 0.01 10*3/uL (ref 0.00–0.07)
Basophils Absolute: 0.1 10*3/uL (ref 0.0–0.1)
Basophils Relative: 1 %
Eosinophils Absolute: 0.1 10*3/uL (ref 0.0–0.5)
Eosinophils Relative: 1 %
HCT: 37.2 % (ref 36.0–46.0)
Hemoglobin: 12.2 g/dL (ref 12.0–15.0)
Immature Granulocytes: 0 %
Lymphocytes Relative: 19 %
Lymphs Abs: 1.3 10*3/uL (ref 0.7–4.0)
MCH: 30 pg (ref 26.0–34.0)
MCHC: 32.8 g/dL (ref 30.0–36.0)
MCV: 91.6 fL (ref 80.0–100.0)
Monocytes Absolute: 0.5 10*3/uL (ref 0.1–1.0)
Monocytes Relative: 7 %
Neutro Abs: 5 10*3/uL (ref 1.7–7.7)
Neutrophils Relative %: 72 %
Platelets: 258 10*3/uL (ref 150–400)
RBC: 4.06 MIL/uL (ref 3.87–5.11)
RDW: 12.7 % (ref 11.5–15.5)
WBC: 6.8 10*3/uL (ref 4.0–10.5)
nRBC: 0 % (ref 0.0–0.2)

## 2023-12-08 LAB — URINALYSIS, ROUTINE W REFLEX MICROSCOPIC
Bilirubin Urine: NEGATIVE
Glucose, UA: NEGATIVE mg/dL
Hgb urine dipstick: NEGATIVE
Ketones, ur: NEGATIVE mg/dL
Nitrite: NEGATIVE
Protein, ur: 30 mg/dL — AB
Specific Gravity, Urine: 1.019 (ref 1.005–1.030)
pH: 5 (ref 5.0–8.0)

## 2023-12-08 LAB — COMPREHENSIVE METABOLIC PANEL
ALT: 13 U/L (ref 0–44)
AST: 14 U/L — ABNORMAL LOW (ref 15–41)
Albumin: 3.6 g/dL (ref 3.5–5.0)
Alkaline Phosphatase: 65 U/L (ref 38–126)
Anion gap: 8 (ref 5–15)
BUN: 19 mg/dL (ref 8–23)
CO2: 21 mmol/L — ABNORMAL LOW (ref 22–32)
Calcium: 8.3 mg/dL — ABNORMAL LOW (ref 8.9–10.3)
Chloride: 111 mmol/L (ref 98–111)
Creatinine, Ser: 0.71 mg/dL (ref 0.44–1.00)
GFR, Estimated: >60 mL/min (ref >60)
Glucose, Bld: 102 mg/dL — ABNORMAL HIGH (ref 70–99)
Potassium: 3.3 mmol/L — ABNORMAL LOW (ref 3.5–5.1)
Sodium: 140 mmol/L (ref 135–145)
Total Bilirubin: 1.8 mg/dL — ABNORMAL HIGH (ref 0.0–1.2)
Total Protein: 6.3 g/dL — ABNORMAL LOW (ref 6.5–8.1)

## 2023-12-08 LAB — LIPASE, BLOOD: Lipase: 32 U/L (ref 11–51)

## 2023-12-08 MED ORDER — ONDANSETRON 4 MG PO TBDP
4.0000 mg | ORAL_TABLET | Freq: Once | ORAL | Status: DC
  Filled 2023-12-08: qty 1

## 2023-12-08 MED ORDER — METOCLOPRAMIDE HCL 10 MG PO TABS: 10.0000 mg | ORAL_TABLET | Freq: Four times a day (QID) | ORAL | 0 refills | Status: DC

## 2023-12-08 MED ORDER — METOCLOPRAMIDE HCL 5 MG/ML IJ SOLN
10.0000 mg | Freq: Once | INTRAMUSCULAR | Status: AC
  Administered 2023-12-08: 10 mg via INTRAVENOUS
  Filled 2023-12-08: qty 2

## 2023-12-08 MED ORDER — ONDANSETRON HCL 4 MG/2ML IJ SOLN
4.0000 mg | Freq: Once | INTRAMUSCULAR | Status: AC
  Administered 2023-12-08: 4 mg via INTRAVENOUS
  Filled 2023-12-08: qty 2

## 2023-12-08 MED ORDER — ONDANSETRON 4 MG PO TBDP: 4.0000 mg | ORAL_TABLET | Freq: Three times a day (TID) | ORAL | 0 refills | Status: DC | PRN

## 2023-12-08 MED ORDER — POTASSIUM CHLORIDE CRYS ER 20 MEQ PO TBCR
40.0000 meq | EXTENDED_RELEASE_TABLET | Freq: Once | ORAL | Status: AC
  Administered 2023-12-08: 40 meq via ORAL
  Filled 2023-12-08: qty 2

## 2023-12-08 NOTE — ED Triage Notes (Signed)
 PT having nausea, vomiting, diarrh since New Years Day. Pt has been unable to keep things down today. Feels dizzy and thought he had vertigo at first but thinks now dehydrated.

## 2023-12-08 NOTE — ED Provider Notes (Signed)
 [place] EMERGENCY DEPARTMENT AT [facility] Provider Note   CSN: [id number] Arrival date & time: 12/08/23  0540     History  Chief Complaint  Patient presents with   Abdominal Pain    [name] is a 68 y.o. female hyperlipidemia, hypertension, depression reporting to emergency room with stomach cramping, emesis and diarrhea.  This has been an ongoing for 3 days.  Patient reports she has had 3-4 episodes of diarrhea each day, nonbloody, loose stool. Reports multiple episodes of nonbilioius, nonbloody vomit.  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  No other known sick contacts.  Reports no recent travel.  Denies chest pain, shortness of breath, fever, chills.   Abdominal Pain      Home Medications Prior to Admission medications   Medication Sig Start Date End Date Taking? Authorizing Provider  ALPRAZolam  (XANAX ) 0.25 MG tablet Take 1 tablet (0.25 mg total) by mouth 2 (two) times daily as needed for anxiety. 03/29/21   Rollene Almarie LABOR, MD  AMBIEN  CR 6.25 MG CR tablet Take 1 tablet (6.25 mg total) by mouth at bedtime as needed for sleep. 10/03/23   Elnor Lauraine BRAVO, NP  Biotin 5 MG CAPS Take 1 capsule by mouth daily.    [provider]  Calcium  Citrate-Vitamin D  250-200 MG-UNIT TABS Take 1 tablet by mouth 2 (two) times daily.    [provider]  Estradiol 4 MCG INST Imvexxy Maintenance Pack 4 mcg vaginal insert    [provider]  fluticasone  (FLONASE ) 50 MCG/ACT nasal spray Place 2 sprays into both nostrils daily. 08/30/22   Gladis Elsie BROCKS, PA-C  Glucosamine Sulfate 1000 MG CAPS Take 1 capsule by mouth daily.     [provider]  irbesartan  (AVAPRO ) 150 MG tablet Take 1 tablet (150 mg total) by mouth daily. 06/08/23   Croitoru, Mihai, MD  Omega-3 Fatty Acids (FISH OIL PO) Take 2,000 mg by mouth 2 (two) times daily.    [provider]  omeprazole (PRILOSEC) 20 MG capsule Take 20  mg by mouth 2 (two) times daily as needed.    [provider]  sucralfate  (CARAFATE ) 1 g tablet Take 1 tablet (1 g total) by mouth 3 (three) times daily as needed. 06/04/23   Croitoru, Mihai, MD  valACYclovir  (VALTREX ) 1000 MG tablet Take 1 tablet by mouth twice a day for 3 days then as needed 05/18/22         Allergies    Augmentin  [amoxicillin -pot clavulanate], Codeine phosphate, Darvon [propoxyphene], Oxycodone -acetaminophen , Propoxyphene n-acetaminophen , and Sulfamethoxazole    Review of Systems   Review of Systems  Gastrointestinal:  Positive for abdominal pain.    Physical Exam Updated Vital Signs BP (!) 169/97 (BP Location: Right Arm)   Pulse 61   Temp 97.7 F (36.5 C) (Oral)   Resp 18   SpO2 99%  Physical Exam Vitals and nursing note reviewed.  Constitutional:      General: She is not in acute distress.    Appearance: She is ill-appearing. She is not toxic-appearing.  HENT:     Head: Normocephalic and atraumatic.  Eyes:     General: No scleral icterus.    Conjunctiva/sclera: Conjunctivae normal.  Cardiovascular:     Rate and Rhythm: Normal rate and regular rhythm.     Pulses: Normal pulses.     Heart sounds: Normal heart sounds.  Pulmonary:     Effort: Pulmonary effort is normal. No  respiratory distress.     Breath sounds: Normal breath sounds.  Abdominal:     General: Abdomen is flat. Bowel sounds are normal.     Palpations: Abdomen is soft.     Tenderness: There is no abdominal tenderness.  Skin:    General: Skin is warm and dry.     Capillary Refill: Capillary refill takes less than 2 seconds.     Findings: No lesion.  Neurological:     General: No focal deficit present.     Mental Status: She is alert and oriented to person, place, and time. Mental status is at baseline.     ED Results / Procedures / Treatments   Labs (all labs ordered are listed, but only abnormal results are displayed) Labs Reviewed  URINALYSIS, ROUTINE W REFLEX MICROSCOPIC  - Abnormal; Notable for the following components:      Result Value   APPearance HAZY (*)    Protein, ur 30 (*)    Leukocytes,Ua TRACE (*)    Bacteria, UA RARE (*)    All other components within normal limits  COMPREHENSIVE METABOLIC PANEL - Abnormal; Notable for the following components:   Potassium 3.3 (*)    CO2 21 (*)    Glucose, Bld 102 (*)    Calcium  8.3 (*)    Total Protein 6.3 (*)    AST 14 (*)    Total Bilirubin 1.8 (*)    All other components within normal limits  RESP PANEL BY RT-PCR (RSV, FLU A&B, COVID)  RVPGX2  LIPASE, BLOOD  CBC WITH DIFFERENTIAL/PLATELET    EKG None  Radiology No results found.  Procedures Procedures    Medications Ordered in ED Medications  ondansetron  (ZOFRAN ) injection 4 mg (4 mg Intravenous Given 12/08/23 0739)  metoCLOPramide  (REGLAN ) injection 10 mg (10 mg Intravenous Given 12/08/23 0809)    ED Course/ Medical Decision Making/ A&P                                 Medical Decision Making Amount and/or Complexity of Data Reviewed Labs: ordered.  Risk Prescription drug management.   [name] 68 y.o. presented today for abd pain. Working DDx includes, but not limited to, gastroenteritis, colitis, SBO, appendicitis, cholecystitis, hepatobiliary pathology, gastritis, PUD, ACS, dissection, pancreatitis, nephrolithiasis, AAA, UTI, pyelonephritis, ruptured ectopic pregnancy, PID, ovarian   R/o DDx: These are considered less likely than current impression due to history of present illness, physical exam, labs/imaging findings.  Review of prior external notes: None  Pmhx: hypertension, hyperlipidemia   Unique Tests and My Interpretation:  CBC with differential: no leukocytosis, no anemia  CMP: K 3.3, will replace. Elevated bili of 1.8, similar to prior recent labs, discussed with patient Lipase: 32 UA: trace leukocytes, rare bacteria - patient denies urinary symptoms, will hold off on antibiotics at this time.  Pittore panel  negative  Imaging:  No focal area of tenderness on abdominal exam.   Problem List / ED Course / Critical interventions / Medication management  Patient reporting to emergency room with 4 days of nausea vomiting diarrhea and abdominal cramping.  Patient hemodynamically stable and well-appearing.  No elevated leukocytosis, no anemia.  Patient does have low potassium which was supplemented here.  Otherwise reassuring lab work.  Patient has no focal tenderness on exam thus I do not feel imaging is necessary at this time.  Symptoms are consistent with gastroenteritis.  Symptoms improved after fluids Zofran   and Reglan .  Will send home with medication to manage symptoms and given strict return precaution.  Patient agrees to follow-up with primary care early next week. I ordered medication including Zofran , Reglan , NS PO challenged, tolerating oral intake. Feeling better after fluids and medications.  Reevaluation of the patient after these medicines showed that the patient improved Patients vitals assessed. Upon arrival patient is hemodynamically stable.  I have reviewed the patients home medicines and have made adjustments as needed    Consult: None  Plan:  F/u w/ PCP in 2-3d to ensure resolution of sx.  Patient was given return precautions. Patient stable for discharge at this time.  Patient educated on current sx/dx and verbalized understanding of plan. Return to ER w/ new or worsening sx.          Final Clinical Impression(s) / ED Diagnoses Final diagnoses:  Gastroenteritis    Rx / DC Orders ED Discharge Orders          Ordered    metoCLOPramide  (REGLAN ) 10 MG tablet  Every 6 hours        12/08/23 0957    ondansetron  (ZOFRAN -ODT) 4 MG disintegrating tablet  Every 8 hours PRN        12/08/23 0957              Annielee Jemmott, Warren SAILOR, PA-C 12/08/23 1016    Dean Clarity, MD 12/08/23 1044

## 2023-12-08 NOTE — Discharge Instructions (Addendum)
 You were seen in the emergency room today for gastroenteritis.  I have sent Zofran  and Reglan  to your pharmacy please take as prescribed. Take pepto or Immodium as needed for diarrhea. Wash your hands regularly.  Please make sure you are staying well-hydrated with water you can alternate Pedialyte and Gatorade as needed.  Please return to emergency room with new or worsening symptoms including if you are unable to keep anything down.

## 2023-12-08 NOTE — ED Notes (Signed)
 Pt currently reports no n/v following fluid challenge.

## 2023-12-17 ENCOUNTER — Telehealth: Payer: Self-pay

## 2023-12-17 NOTE — Progress Notes (Signed)
 Transition Care Management Unsuccessful Follow-up Telephone Call  Date of discharge and from where:  12/08/2023 [facility]  Attempts:  2nd Attempt  Reason for unsuccessful TCM follow-up call:  Left voice message  Yoland Scherr [facility]  [facility], [facility] Resource Care Guide Direct Dial: [phone]  Website: [email or web address]

## 2023-12-17 NOTE — Progress Notes (Signed)
 Transition Care Management Unsuccessful Follow-up Telephone Call  Date of discharge and from where:  12/08/2023 [facility]  Attempts:  1st Attempt  Reason for unsuccessful TCM follow-up call:  Left voice message  Blaise Grieshaber [facility]  [facility], [facility] Resource Care Guide Direct Dial: [phone]  Website: [email or web address]

## 2023-12-24 ENCOUNTER — Encounter: Payer: Self-pay | Admitting: Cardiovascular Disease

## 2023-12-24 MED ORDER — AMLODIPINE BESYLATE 5 MG PO TABS: 5.0000 mg | ORAL_TABLET | Freq: Every day | ORAL | 3 refills | Status: AC

## 2023-12-24 NOTE — Telephone Encounter (Signed)
Will be glad to refer her to a gastroenterologist.  Please stop the irbesartan and start amlodipine 5 mg once daily.

## 2023-12-29 ENCOUNTER — Emergency Department (HOSPITAL_COMMUNITY)
Admission: EM | Admit: 2023-12-29 | Discharge: 2023-12-30 | Disposition: A | Discharge status: Home / Self Care | Payer: Medicare Other | Attending: Emergency Medicine | Admitting: Emergency Medicine

## 2023-12-29 ENCOUNTER — Other Ambulatory Visit: Payer: Self-pay

## 2023-12-29 DIAGNOSIS — K449 Diaphragmatic hernia without obstruction or gangrene: Secondary | ICD-10-CM | POA: Diagnosis not present

## 2023-12-29 DIAGNOSIS — Z79899 Other long term (current) drug therapy: Secondary | ICD-10-CM | POA: Insufficient documentation

## 2023-12-29 DIAGNOSIS — I10 Essential (primary) hypertension: Secondary | ICD-10-CM | POA: Insufficient documentation

## 2023-12-29 DIAGNOSIS — K5730 Diverticulosis of large intestine without perforation or abscess without bleeding: Secondary | ICD-10-CM | POA: Diagnosis not present

## 2023-12-29 DIAGNOSIS — K2900 Acute gastritis without bleeding: Secondary | ICD-10-CM | POA: Diagnosis not present

## 2023-12-29 DIAGNOSIS — N281 Cyst of kidney, acquired: Secondary | ICD-10-CM | POA: Diagnosis not present

## 2023-12-29 DIAGNOSIS — R109 Unspecified abdominal pain: Secondary | ICD-10-CM | POA: Diagnosis present

## 2023-12-29 DIAGNOSIS — K529 Noninfective gastroenteritis and colitis, unspecified: Secondary | ICD-10-CM | POA: Insufficient documentation

## 2023-12-29 LAB — COMPREHENSIVE METABOLIC PANEL
ALT: 12 U/L (ref 0–44)
AST: 18 U/L (ref 15–41)
Albumin: 4.7 g/dL (ref 3.5–5.0)
Alkaline Phosphatase: 81 U/L (ref 38–126)
Anion gap: 9 (ref 5–15)
BUN: 17 mg/dL (ref 8–23)
CO2: 24 mmol/L (ref 22–32)
Calcium: 9.9 mg/dL (ref 8.9–10.3)
Chloride: 106 mmol/L (ref 98–111)
Creatinine, Ser: 0.83 mg/dL (ref 0.44–1.00)
GFR, Estimated: >60 mL/min (ref >60)
Glucose, Bld: 99 mg/dL (ref 70–99)
Potassium: 3.8 mmol/L (ref 3.5–5.1)
Sodium: 139 mmol/L (ref 135–145)
Total Bilirubin: 2.6 mg/dL — ABNORMAL HIGH (ref 0.0–1.2)
Total Protein: 7.8 g/dL (ref 6.5–8.1)

## 2023-12-29 LAB — URINALYSIS, ROUTINE W REFLEX MICROSCOPIC
Bacteria, UA: NONE SEEN
Bilirubin Urine: NEGATIVE
Glucose, UA: NEGATIVE mg/dL
Ketones, ur: NEGATIVE mg/dL
Nitrite: NEGATIVE
Protein, ur: NEGATIVE mg/dL
Specific Gravity, Urine: 1.005 (ref 1.005–1.030)
pH: 7 (ref 5.0–8.0)

## 2023-12-29 LAB — CBC
HCT: 42.8 % (ref 36.0–46.0)
Hemoglobin: 14.6 g/dL (ref 12.0–15.0)
MCH: 30.2 pg (ref 26.0–34.0)
MCHC: 34.1 g/dL (ref 30.0–36.0)
MCV: 88.4 fL (ref 80.0–100.0)
Platelets: 318 10*3/uL (ref 150–400)
RBC: 4.84 MIL/uL (ref 3.87–5.11)
RDW: 12.6 % (ref 11.5–15.5)
WBC: 6.3 10*3/uL (ref 4.0–10.5)
nRBC: 0 % (ref 0.0–0.2)

## 2023-12-29 LAB — LIPASE, BLOOD: Lipase: 39 U/L (ref 11–51)

## 2023-12-29 MED ORDER — FAMOTIDINE IN NACL 20-0.9 MG/50ML-% IV SOLN
20.0000 mg | Freq: Once | INTRAVENOUS | Status: AC
  Administered 2023-12-29: 20 mg via INTRAVENOUS
  Filled 2023-12-29: qty 50

## 2023-12-29 MED ORDER — SODIUM CHLORIDE 0.9 % IV BOLUS
1000.0000 mL | Freq: Once | INTRAVENOUS | Status: AC
  Administered 2023-12-29: 1000 mL via INTRAVENOUS

## 2023-12-29 MED ORDER — FENTANYL CITRATE PF 50 MCG/ML IJ SOSY
50.0000 ug | PREFILLED_SYRINGE | Freq: Once | INTRAMUSCULAR | Status: AC
  Administered 2023-12-29: 50 ug via INTRAVENOUS
  Filled 2023-12-29: qty 1

## 2023-12-29 MED ORDER — ONDANSETRON 4 MG PO TBDP
4.0000 mg | ORAL_TABLET | Freq: Once | ORAL | Status: AC | PRN
  Administered 2023-12-29: 4 mg via ORAL
  Filled 2023-12-29: qty 1

## 2023-12-29 NOTE — ED Triage Notes (Signed)
Patient to ED by POV with c/o upper ABD discomfort. She states pain has gone on for 2 weeks. Pain is a sharp pain in ABD causing nausea. Denies vomiting but reports loose stools. She took OTC meds with no relief also sucralfate 1GM

## 2023-12-29 NOTE — ED Provider Notes (Signed)
[place] EMERGENCY DEPARTMENT AT [facility] Provider Note   CSN: [id number] Arrival date & time: 12/29/23  1710     History {Add pertinent medical, surgical, social history, OB history to HPI:1} Chief Complaint  Patient presents with   Abdominal Pain    [name] is a 68 y.o. female.   Abdominal Pain 68 year old female history of depression, hypertension, hyperlipidemia presenting for abdominal pain.  She states she was first evaluated January 4 when this for started.  She had nausea, vomiting, epigastric pain.  She was given Zofran, Reglan at home and discharged.  She is been trying Maalox and send the pain is present persistent.  It is worse with movement, worse with eating.  She is not having any vomiting or diarrhea but still has some nausea.  No fevers or chills.  No chest pain or shortness of breath.  She feels somewhat dizzy.  It is worse this week and persistent which is why she is here.  No history of peptic ulcers as far she is aware.  She is not on PPI or Pepcid.     Home Medications Prior to Admission medications   Medication Sig Start Date End Date Taking? Authorizing Provider  ALPRAZolam (XANAX) 0.25 MG tablet Take 1 tablet (0.25 mg total) by mouth 2 (two) times daily as needed for anxiety. 03/29/21   Myrlene Broker, MD  AMBIEN CR 6.25 MG CR tablet Take 1 tablet (6.25 mg total) by mouth at bedtime as needed for sleep. 10/03/23   Elenore Paddy, NP  amLODipine (NORVASC) 5 MG tablet Take 1 tablet (5 mg total) by mouth daily. 12/24/23   Croitoru, Mihai, MD  Biotin 5 MG CAPS Take 1 capsule by mouth daily.    [provider]  Calcium Citrate-Vitamin D 250-200 MG-UNIT TABS Take 1 tablet by mouth 2 (two) times daily.    [provider]  Estradiol 4 MCG INST Imvexxy Maintenance Pack 4 mcg vaginal insert    [provider]  fluticasone (FLONASE) 50 MCG/ACT nasal spray Place 2 sprays into both nostrils daily. 08/30/22   Waldon Merl, PA-C  Glucosamine Sulfate 1000 MG CAPS Take 1 capsule by mouth daily.     [provider]  metoCLOPramide (REGLAN) 10 MG tablet Take 1 tablet (10 mg total) by mouth every 6 (six) hours. 12/08/23   Barrett, Horald Chestnut, PA-C  Omega-3 Fatty Acids (FISH OIL PO) Take 2,000 mg by mouth 2 (two) times daily.    [provider]  omeprazole (PRILOSEC) 20 MG capsule Take 20 mg by mouth 2 (two) times daily as needed.    [provider]  ondansetron (ZOFRAN-ODT) 4 MG disintegrating tablet Take 1 tablet (4 mg total) by mouth every 8 (eight) hours as needed for nausea or vomiting. 12/08/23   Barrett, Horald Chestnut, PA-C  sucralfate (CARAFATE) 1 g tablet Take 1 tablet (1 g total) by mouth 3 (three) times daily as needed. 06/04/23   Croitoru, Mihai, MD  valACYclovir (VALTREX) 1000 MG tablet Take 1 tablet by mouth twice a day for 3 days then as needed 05/18/22         Allergies    Augmentin [amoxicillin-pot clavulanate], Codeine phosphate, Darvon [propoxyphene], Oxycodone-acetaminophen, Propoxyphene n-acetaminophen, and Sulfamethoxazole    Review of Systems   Review of Systems  Gastrointestinal:  Positive for abdominal pain.  Review of systems completed and notable as per HPI.  ROS otherwise negative.   Physical Exam Updated Vital Signs BP Marland Kitchen)  167/110   Pulse 77   Temp (!) 97.5 F (36.4 C)   Resp 18   Ht 5\' 4"  (1.626 m)   Wt 55 kg   SpO2 99%   BMI 20.81 kg/m  Physical Exam Vitals and nursing note reviewed.  Constitutional:      General: She is not in acute distress.    Appearance: She is well-developed.  HENT:     Head: Normocephalic and atraumatic.  Eyes:     Conjunctiva/sclera: Conjunctivae normal.  Cardiovascular:     Rate and Rhythm: Normal rate and regular rhythm.     Pulses: Normal pulses.     Heart sounds: Normal heart sounds. No murmur heard. Pulmonary:     Effort: Pulmonary effort is normal. No respiratory distress.     Breath sounds: Normal breath sounds.   Abdominal:     Palpations: Abdomen is soft.     Tenderness: There is abdominal tenderness in the epigastric area. There is no right CVA tenderness, left CVA tenderness, guarding or rebound.  Musculoskeletal:        General: No swelling.     Cervical back: Neck supple.     Right lower leg: No edema.     Left lower leg: No edema.  Skin:    General: Skin is warm and dry.     Capillary Refill: Capillary refill takes less than 2 seconds.  Neurological:     Mental Status: She is alert.  Psychiatric:        Mood and Affect: Mood normal.     ED Results / Procedures / Treatments   Labs (all labs ordered are listed, but only abnormal results are displayed) Labs Reviewed  COMPREHENSIVE METABOLIC PANEL - Abnormal; Notable for the following components:      Result Value   Total Bilirubin 2.6 (*)    All other components within normal limits  LIPASE, BLOOD  CBC  URINALYSIS, ROUTINE W REFLEX MICROSCOPIC    EKG None  Radiology No results found.  Procedures Procedures  {Document cardiac monitor, telemetry assessment procedure when appropriate:1}  Medications Ordered in ED Medications  ondansetron (ZOFRAN-ODT) disintegrating tablet 4 mg (4 mg Oral Given 12/29/23 1730)    ED Course/ Medical Decision Making/ A&P   {   Click here for ABCD2, HEART and other calculatorsREFRESH Note before signing :1}                              Medical Decision Making Amount and/or Complexity of Data Reviewed Labs: ordered.  Risk Prescription drug management.   Medical Decision Making:   [name] is a 68 y.o. female who presented to the ED today with epigastric abdominal pain for some time.  She is hypertensive but otherwise stable.  She is tender in the epigastrium on exam.  I wonder if she may have peptic ulcer disease.  Will treat for this, obtain CT scan given no recent imaging to evaluate for possible mass, biliary pathology.  No lower abdominal tenderness lower concern for  obstruction, appendicitis.   {crccomplexity:27900} Reviewed and confirmed nursing documentation for past medical history, family history, social history.  Reassessment and Plan:   ***    Patient's presentation is most consistent with {EM COPA:27473}     {Document critical care time when appropriate:1} {Document review of labs and clinical decision tools ie heart score, Chads2Vasc2 etc:1}  {Document your independent review of radiology images, and any outside records:1} {Document  your discussion with family members, caretakers, and with consultants:1} {Document social determinants of health affecting pt's care:1} {Document your decision making why or why not admission, treatments were needed:1} Final Clinical Impression(s) / ED Diagnoses Final diagnoses:  None    Rx / DC Orders ED Discharge Orders     None

## 2023-12-30 ENCOUNTER — Emergency Department (HOSPITAL_COMMUNITY): Payer: Medicare Other

## 2023-12-30 DIAGNOSIS — N281 Cyst of kidney, acquired: Secondary | ICD-10-CM | POA: Diagnosis not present

## 2023-12-30 DIAGNOSIS — K449 Diaphragmatic hernia without obstruction or gangrene: Secondary | ICD-10-CM | POA: Diagnosis not present

## 2023-12-30 DIAGNOSIS — K5730 Diverticulosis of large intestine without perforation or abscess without bleeding: Secondary | ICD-10-CM | POA: Diagnosis not present

## 2023-12-30 MED ORDER — FAMOTIDINE 20 MG PO TABS: 20.0000 mg | ORAL_TABLET | Freq: Two times a day (BID) | ORAL | 0 refills | Status: DC | PRN

## 2023-12-30 MED ORDER — LIDOCAINE VISCOUS HCL 2 % MT SOLN
15.0000 mL | Freq: Once | OROMUCOSAL | Status: AC
  Administered 2023-12-30: 15 mL via ORAL
  Filled 2023-12-30: qty 15

## 2023-12-30 MED ORDER — ESOMEPRAZOLE MAGNESIUM 40 MG PO CPDR: 40.0000 mg | DELAYED_RELEASE_CAPSULE | Freq: Every day | ORAL | 0 refills | Status: AC

## 2023-12-30 MED ORDER — IOHEXOL 300 MG/ML  SOLN
100.0000 mL | Freq: Once | INTRAMUSCULAR | Status: AC | PRN
  Administered 2023-12-30: 100 mL via INTRAVENOUS

## 2023-12-30 MED ORDER — ALUM & MAG HYDROXIDE-SIMETH 200-200-20 MG/5ML PO SUSP
30.0000 mL | Freq: Once | ORAL | Status: AC
  Administered 2023-12-30: 30 mL via ORAL
  Filled 2023-12-30: qty 30

## 2023-12-30 NOTE — ED Provider Notes (Signed)
  Physical Exam  BP (!) 147/82   Pulse 66   Temp 98 F (36.7 C) (Oral)   Resp 18   Ht 5\' 4"  (1.626 m)   Wt 55 kg   SpO2 99%   BMI 20.81 kg/m   Physical Exam Vitals and nursing note reviewed.  Constitutional:      General: She is not in acute distress.    Appearance: She is well-developed.  HENT:     Head: Normocephalic and atraumatic.  Eyes:     Conjunctiva/sclera: Conjunctivae normal.  Cardiovascular:     Rate and Rhythm: Normal rate and regular rhythm.     Heart sounds: No murmur heard. Pulmonary:     Effort: Pulmonary effort is normal. No respiratory distress.     Breath sounds: Normal breath sounds.  Abdominal:     Palpations: Abdomen is soft.     Tenderness: There is no abdominal tenderness.  Musculoskeletal:        General: No swelling.     Cervical back: Neck supple.  Skin:    General: Skin is warm and dry.     Capillary Refill: Capillary refill takes less than 2 seconds.  Neurological:     Mental Status: She is alert.  Psychiatric:        Mood and Affect: Mood normal.     Procedures  Procedures  ED Course / MDM    Medical Decision Making Amount and/or Complexity of Data Reviewed Labs: ordered. Radiology: ordered.  Risk OTC drugs. Prescription drug management.   Patient received in handoff.  Epigastric abdominal pain pending CT imaging at time of signout.  CT imaging concerning for enteritis but is otherwise unremarkable for acute pathology.  Given GI cocktail prior to discharge and will start on a PPI and use Pepcid as needed.  She has Zofran at home and I did encourage her to continue using this medicine.  Referral sent to gastroenterology as her presentation appears consistent with developing peptic ulcer disease.       Glendora Score, MD 12/30/23 [phone]

## 2023-12-31 ENCOUNTER — Encounter: Payer: Self-pay | Admitting: Nurse Practitioner

## 2024-02-06 NOTE — Progress Notes (Signed)
 02/06/2024 [name] [id number] November 20, 1956   CHIEF COMPLAINT: Gastritis, ED follow up  HISTORY OF PRESENT ILLNESS: [name] is a 68 year old female with a past medical history of anxiety, depression, hypertension, hyperlipidemia, kidney stones s/p stent placement, diverticulosis and hepatic steatosis.  She presented to the ED 12/08/2023 with N/V/D and stomach cramping x 3 days. K+ 3.3. T. Bili 1.8. Lipase 32. It was thought she likely had gastroenteritis. Her symptoms improved after receiving Zofran and Reglan and she was discharged home.   Her epigastric pain and nausea persisted without further vomiting and diarrhea was less. She presented to the ED 12/29/2023 with nausea without vomiting with epigastric pain and she felt slightly dizzy. WBC 6.3. T. Bili 2.6. Normal Alk phos, AST/ALT levels. 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 She was prescribed Esomeprazole 40mg  every day, Carafate 1gmp po qid and Famotidine PRN. She stated she was diagnosed with Norovirus, however, I do not see a GI pathogen panel showing Norovirus in Epic results. She took Nexium for about one month, Carafate x 10 days and Famotidine as needed. Her epigastric pain abated within 4 days after starting Nexium. She also maintained a bland diet. She has lost 3 to 4 lbs. No NSAID use. No known history of gallstones. She is passing a normal brown stool daily. No rectal bleeding or black stools. She underwent a colonoscopy by Dr. Russella Dar in 2016 which was normal. No known family history of IBD or colorectal cancer.      Latest Ref Rng & Units 12/29/2023    5:28 PM 12/08/2023    7:19 AM 10/03/2023    2:01 AM  CBC  WBC 4.0 - 10.5  K/uL 6.3  6.8  6.8   Hemoglobin 12.0 - 15.0 g/dL 40.9  81.1  91.4   Hematocrit 36.0 - 46.0 % 42.8  37.2  38.6   Platelets 150 - 400 K/uL 318  258  293        Latest Ref Rng & Units 12/29/2023    5:28 PM 12/08/2023    7:19 AM 10/03/2023    2:01 AM  CMP  Glucose 70 - 99 mg/dL 99  782  956   BUN 8 - 23 mg/dL 17  19  18    Creatinine 0.44 - 1.00 mg/dL 2.13  0.86  5.78   Sodium 135 - 145 mmol/L 139  140  142   Potassium 3.5 - 5.1 mmol/L 3.8  3.3  3.6   Chloride 98 - 111 mmol/L 106  111  108   CO2 22 - 32 mmol/L 24  21  23    Calcium 8.9 - 10.3 mg/dL 9.9  8.3  9.4   Total Protein 6.5 - 8.1 g/dL 7.8  6.3    Total Bilirubin 0.0 - 1.2 mg/dL 2.6  1.8    Alkaline Phos 38 - 126 U/L 81  65    AST 15 - 41 U/L 18  14    ALT 0 - 44 U/L 12  13     CTAP with contrast 12/2023: FINDINGS: Lower chest: Small hiatal hernia. The cardiac size is normal. New demonstration of a small anterior pericardial effusion is noted.   Lung bases are clear apart from mild posterior atelectasis. There are bilateral  subpectoral breast implants again noted.   Hepatobiliary: No focal liver abnormality is seen. No gallstones, gallbladder wall thickening, or biliary dilatation. The liver is mildly steatotic.   Pancreas: No abnormality.   Spleen: Stable 1.2 cm septated cyst anteriorly. Additional scattered tiny hypodensities are too small to characterize but unchanged.   Adrenals/Urinary Tract: No adrenal mass. Stable 7 mm too small to characterize hypodense Bosniak 2 cyst superior pole left kidney.   New 6 mm hypodense too small to characterize Bosniak 2 cyst inferior pole of the right kidney.   No follow-up imaging is recommended. There is no mass enhancement in either kidney.   There is no urinary stone or obstruction. There is no bladder thickening.   Stomach/Bowel: The stomach is contracted. There is no bowel obstruction.   There is fluid filling and mucosal enhancement without dilatation or mesenteric  inflammatory change, involving multiple lower abdominal small bowel segments, findings consistent with nonspecific enteritis.   Scattered diverticula descending and sigmoid colon noted without evidence of colitis or diverticulitis.   Vascular/Lymphatic: No significant vascular findings are present. No enlarged abdominal or pelvic lymph nodes.   Reproductive: Uterus and bilateral adnexa are unremarkable.   Other: No abdominal wall hernia or abnormality. No abdominopelvic ascites.   Musculoskeletal: Chronic L5 pars defects. Chronic L5-S1 disc collapse and minimal grade 1 anterolisthesis. No acute or other significant osseous findings.   IMPRESSION: 1. Nonspecific enteritis involving multiple lower abdominal small bowel segments. No bowel obstruction or mesenteric inflammatory change. 2. Colonic diverticulosis without evidence of diverticulitis. 3. Small hiatal hernia. 4. Mild hepatic steatosis. 5. Small anterior pericardial effusion, new. 6. Chronic L5 pars defects with minimal grade 1 anterolisthesis and chronic L5-S1 disc collapse.  PAST GI PROCEDURES:  Colonoscopy 10/18/2015 by Dr. Russella Dar: Normal colonoscopy  Recall colonoscopy 10 years   EGD 2002 by [facility]: Results not available in care everywhere or Epic  Past Medical History:  Diagnosis Date   Acid reflux    Cancer (HCC)    precancerous cervix area   Depression    Hyperlipidemia    Hypertension    Past Surgical History:  Procedure Laterality Date   APPENDECTOMY     BREAST ENHANCEMENT SURGERY     CYSTOSCOPY WITH RETROGRADE PYELOGRAM, URETEROSCOPY AND STENT PLACEMENT Right 10/17/2016   Procedure: CYSTOSCOPY WITH RETROGRADE PYELOGRAM, URETEROSCOPY LASER LITHOTRIPSY AND STENT PLACEMENT;  Surgeon: Crist Fat, MD;  Location: [facility];  Service: Urology;  Laterality: Right;   DILATION AND CURETTAGE OF UTERUS     maxillofacial surgery     for TMJ   TONSILLECTOMY     UPPER GASTROINTESTINAL ENDOSCOPY  2002    Dr Bosie Clos   Social History:  She is divorced. 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 Past smoker, quit smoking cigarettes 31 years ago. Infrequent alcohol intake. No drug use.   Family History: Two sisters and maternal aunt with breast cancer. Father with CAD and CVA. Mother with emphysema. Brother with DM and Alzheimer's  disease.   Allergies  Allergen Reactions   Augmentin [Amoxicillin-Pot Clavulanate] Nausea And Vomiting   Codeine Phosphate Nausea And Vomiting   Darvon [Propoxyphene] Nausea And Vomiting   Oxycodone-Acetaminophen Nausea And Vomiting   Propoxyphene N-Acetaminophen Nausea And Vomiting   Sulfamethoxazole Nausea And Vomiting     Outpatient Encounter Medications as of 02/07/2024  Medication Sig   ALPRAZolam (XANAX) 0.25 MG tablet Take 1 tablet (0.25 mg total) by mouth 2 (two) times daily as needed for anxiety.   AMBIEN CR  6.25 MG CR tablet Take 1 tablet (6.25 mg total) by mouth at bedtime as needed for sleep.   amLODipine (NORVASC) 5 MG tablet Take 1 tablet (5 mg total) by mouth daily. (Patient not taking: Reported on 12/30/2023)   Biotin 5 MG CAPS Take 1 capsule by mouth daily.   Calcium Citrate-Vitamin D 250-200 MG-UNIT TABS Take 1 tablet by mouth 2 (two) times daily.   esomeprazole (NEXIUM) 40 MG capsule Take 1 capsule (40 mg total) by mouth daily.   Estradiol 4 MCG INST    EVISTA 60 MG tablet Take 60 mg by mouth daily.   famotidine (PEPCID) 20 MG tablet Take 1 tablet (20 mg total) by mouth 2 (two) times daily as needed for heartburn or indigestion.   fluticasone (FLONASE) 50 MCG/ACT nasal spray Place 2 sprays into both nostrils daily.   Glucosamine Sulfate 1000 MG CAPS Take 1 capsule by mouth daily.    irbesartan (AVAPRO) 150 MG tablet Take 150 mg by mouth daily.   metoCLOPramide (REGLAN) 10 MG tablet Take 1 tablet (10 mg total) by mouth every 6 (six) hours. (Patient not taking: Reported on 12/30/2023)   Omega-3 Fatty Acids (FISH OIL PO) Take 2,000 mg by  mouth 2 (two) times daily.   omeprazole (PRILOSEC) 20 MG capsule Take 20 mg by mouth 2 (two) times daily as needed. (Patient not taking: Reported on 12/30/2023)   ondansetron (ZOFRAN-ODT) 4 MG disintegrating tablet Take 1 tablet (4 mg total) by mouth every 8 (eight) hours as needed for nausea or vomiting. (Patient not taking: Reported on 12/30/2023)   RETIN-A 0.05 % cream Apply 1 Application topically at bedtime.   sucralfate (CARAFATE) 1 g tablet Take 1 tablet (1 g total) by mouth 3 (three) times daily as needed.   valACYclovir (VALTREX) 1000 MG tablet Take 1 tablet by mouth twice a day for 3 days then as needed   No facility-administered encounter medications on file as of 02/07/2024.   REVIEW OF SYSTEMS:  Gen: Denies fever, sweats or chills. No weight loss.  CV: Denies chest pain, palpitations or edema. Resp: Denies cough, shortness of breath of hemoptysis.  GI: See HPI.  GU: Denies urinary burning, blood in urine, increased urinary frequency or incontinence. MS: Denies joint pain, muscles aches or weakness. Derm: Denies rash, itchiness, skin lesions or unhealing ulcers. Psych:+ Anxiety and depression. Heme: Denies bruising, easy bleeding. Neuro:  Denies headaches, dizziness or paresthesias. Endo:  Denies any problems with DM, thyroid or adrenal function.  PHYSICAL EXAM: BP 134/88   Pulse 69   Ht 5\' 4"  (1.626 m)   Wt 119 lb 8 oz (54.2 kg)   BMI 20.51 kg/m   Wt Readings from Last 3 Encounters:  02/07/24 119 lb 8 oz (54.2 kg)  12/29/23 121 lb 4.1 oz (55 kg)  10/03/23 122 lb (55.3 kg)    General: in no acute distress. Head: Normocephalic and atraumatic. Eyes:  Sclerae non-icteric, conjunctive pink. Ears: Normal auditory acuity. Mouth: Dentition intact. No ulcers or lesions.  Neck: Supple, no lymphadenopathy or thyromegaly.  Lungs: Clear bilaterally to auscultation without wheezes, crackles or rhonchi. Heart: Regular rate and rhythm. No murmur, rub or gallop appreciated.   Abdomen: Soft, nontender, nondistended. No masses. No hepatosplenomegaly. Normoactive bowel sounds x 4 quadrants.  Rectal: Deferred.  Musculoskeletal: Symmetrical with no gross deformities. Skin: Warm and dry. No rash or lesions on visible extremities. Extremities: No edema. Neurological: Alert oriented x 4, no focal deficits.  Psychological:  Alert and cooperative. Normal  mood and affect.  ASSESSMENT AND PLAN:  68 year old female with N/V/D and epigastrici pain, suspect acute gastroenteritis. CTAP showed CTAP in the ED 12/29/2023 showed nonspecific enteritis involving multiple lower abdominal small bowel segments, colonic diverticulosis without evidence of diverticulitis. Elevated T. Bili level secondary to vomiting. CTAP showed a normal gallbladder without biliary ductal dilatation.  -EGD tp rule out gastritis/PUD benefits and risks discussed including risk with sedation, risk of bleeding, perforation and infection  -Colonoscopy to rule out colonic inflammatory process in setting of enteritis per CT -Hepatic panel to fractionate bilirubin levels -Check RUQ sono if N/V and epigastric pain recurs or if direct bili > indirect -Consider future CCK if EGD/RUQ sono unrevealing  -Avoid NSAIDS -GERD diet  Colon cancer screening. Normal colonoscopy 10/2015. -Colonoscopy as ordered above   Hepatic steatosis per CTAP 12/29/2023 -Avoid significant weight gain -Hepatic panel as ordered above       CC:  Myrlene Broker, *

## 2024-02-07 ENCOUNTER — Ambulatory Visit: Payer: Medicare Other | Admitting: Nurse Practitioner

## 2024-02-07 ENCOUNTER — Other Ambulatory Visit

## 2024-02-07 ENCOUNTER — Encounter: Payer: Self-pay | Admitting: Nurse Practitioner

## 2024-02-07 VITALS — BP 134/88 | HR 69 | Ht 64.0 in | Wt 119.5 lb

## 2024-02-07 DIAGNOSIS — R112 Nausea with vomiting, unspecified: Secondary | ICD-10-CM | POA: Diagnosis not present

## 2024-02-07 DIAGNOSIS — K529 Noninfective gastroenteritis and colitis, unspecified: Secondary | ICD-10-CM

## 2024-02-07 DIAGNOSIS — K5730 Diverticulosis of large intestine without perforation or abscess without bleeding: Secondary | ICD-10-CM

## 2024-02-07 DIAGNOSIS — R1013 Epigastric pain: Secondary | ICD-10-CM | POA: Diagnosis not present

## 2024-02-07 DIAGNOSIS — K760 Fatty (change of) liver, not elsewhere classified: Secondary | ICD-10-CM | POA: Diagnosis not present

## 2024-02-07 LAB — HEPATIC FUNCTION PANEL
ALT: 17 U/L (ref 0–35)
AST: 22 U/L (ref 0–37)
Albumin: 5 g/dL (ref 3.5–5.2)
Alkaline Phosphatase: 81 U/L (ref 39–117)
Bilirubin, Direct: 0.3 mg/dL (ref 0.0–0.3)
Total Bilirubin: 1.6 mg/dL — ABNORMAL HIGH (ref 0.2–1.2)
Total Protein: 7.8 g/dL (ref 6.0–8.3)

## 2024-02-07 NOTE — Patient Instructions (Addendum)
 Your provider has requested that you go to the basement level for lab work before leaving today. Press "B" on the elevator. The lab is located at the first door on the left as you exit the elevator.   Due to recent changes in healthcare laws, you may see the results of your imaging and laboratory studies on MyChart before your provider has had a chance to review them.  We understand that in some cases there may be results that are confusing or concerning to you. Not all laboratory results come back in the same time frame and the provider may be waiting for multiple results in order to interpret others.  Please give [place] 48 hours in order for your provider to thoroughly review all the results before contacting the office for clarification of your results.    Please contact our office if nausea, vomiting, diarrhea, or abdominal pain recurs.  It has been recommended to you by your physician that you have a(n) colonoscopy and endoscopy completed. Per your request, we did not schedule the procedure(s) today. Please contact our office at [phone] should you decide to have the procedure completed. You will be scheduled for a pre-visit and procedure at that time.   Thank you for trusting me with your gastrointestinal care!   Alcide Evener, CRNP    _______________________________________________________  If your blood pressure at your visit was 140/90 or greater, please contact your primary care physician to follow up on this.  _______________________________________________________  If you are age 23 or older, your body mass index should be between 23-30. Your Body mass index is 20.51 kg/m. If this is out of the aforementioned range listed, please consider follow up with your Primary Care Provider.  If you are age 21 or younger, your body mass index should be between 19-25. Your Body mass index is 20.51 kg/m. If this is out of the aformentioned range listed, please consider follow up with  your Primary Care Provider.   ________________________________________________________  The [facility] providers would like to encourage you to use [facility] to communicate with providers for non-urgent requests or questions.  Due to long hold times on the telephone, sending your provider a message by [facility] may be a faster and more efficient way to get a response.  Please allow 48 business hours for a response.  Please remember that this is for non-urgent requests.  _______________________________________________________

## 2024-02-08 DIAGNOSIS — R1013 Epigastric pain: Secondary | ICD-10-CM | POA: Insufficient documentation

## 2024-02-08 NOTE — Progress Notes (Signed)
 Agree with assessment and plan as outlined.

## 2024-02-27 IMAGING — DX DG CHEST 2V
2 series · 2 of 2 positions shown · non-contrast
Comparison: None Available.

CLINICAL DATA: Cough.

EXAM:
CHEST - 2 VIEW

[chest pa]
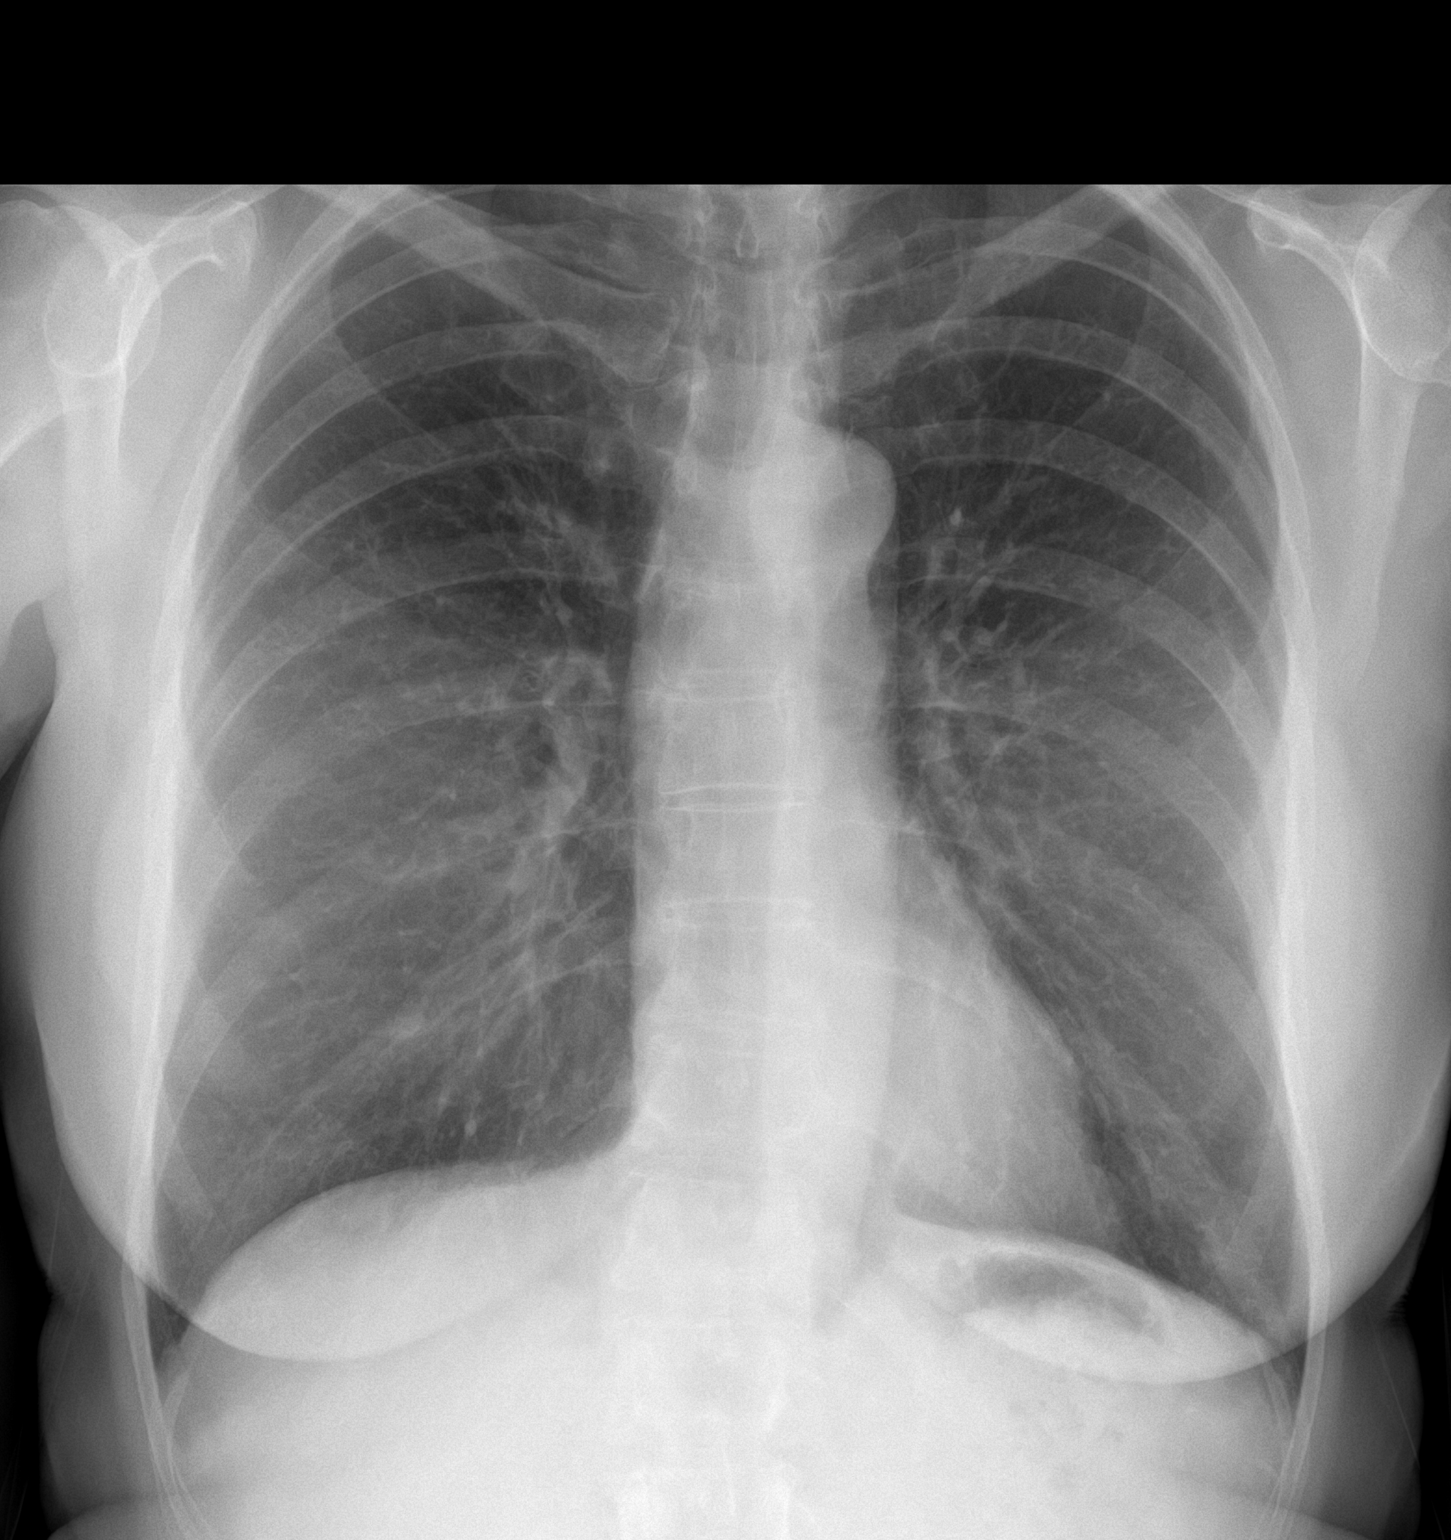

[chest lat]
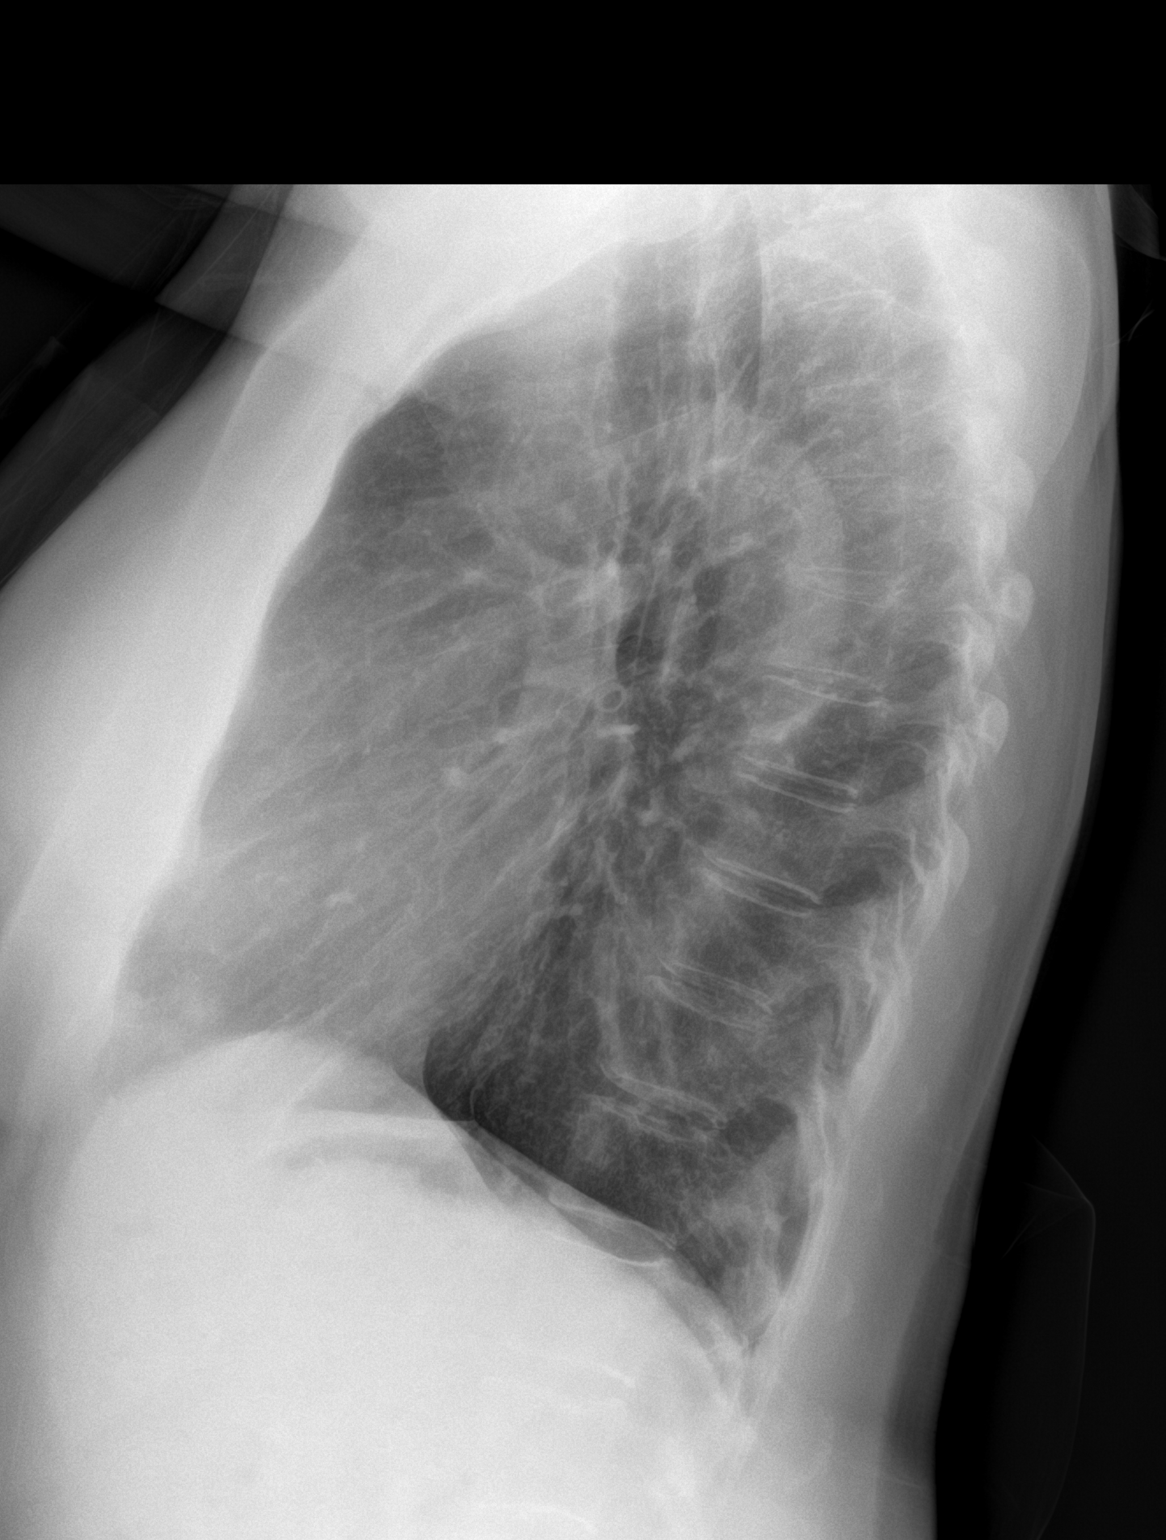

[2 of 2 positions shown; findings below may reference images not displayed]

FINDINGS: The heart size and mediastinal contours are within normal limits.
Both lungs are clear. The visualized skeletal structures are
unremarkable.
IMPRESSION: No active cardiopulmonary disease.

## 2024-05-05 ENCOUNTER — Telehealth: Payer: Self-pay

## 2024-05-05 DIAGNOSIS — R112 Nausea with vomiting, unspecified: Secondary | ICD-10-CM

## 2024-05-05 DIAGNOSIS — R1013 Epigastric pain: Secondary | ICD-10-CM

## 2024-05-05 DIAGNOSIS — K529 Noninfective gastroenteritis and colitis, unspecified: Secondary | ICD-10-CM

## 2024-05-05 NOTE — Telephone Encounter (Signed)
-----   Message from [facility] Naira Standiford S sent at 02/07/2024  9:07 AM EST ----- Schedule for double with Dr. Karene Oto.  Dx epi pain, n/v enteritis

## 2024-05-05 NOTE — Telephone Encounter (Signed)
 Lmtcb.  (Re: schedule double in [facility] with Dr. Karene Oto)

## 2024-05-12 NOTE — Telephone Encounter (Signed)
 Left message for patient stating that I called to schedule her procedures with Dr. Karene Oto.

## 2024-05-13 MED ORDER — NA SULFATE-K SULFATE-MG SULF 17.5-3.13-1.6 GM/177ML PO SOLN: 1.0000 | Freq: Once | ORAL | 0 refills | Status: AC

## 2024-05-13 NOTE — Telephone Encounter (Signed)
 Patient scheduled for pre visit and procedure with provider.

## 2024-05-23 ENCOUNTER — Other Ambulatory Visit (HOSPITAL_BASED_OUTPATIENT_CLINIC_OR_DEPARTMENT_OTHER)

## 2024-06-13 ENCOUNTER — Ambulatory Visit (HOSPITAL_BASED_OUTPATIENT_CLINIC_OR_DEPARTMENT_OTHER)

## 2024-06-16 ENCOUNTER — Encounter

## 2024-06-17 ENCOUNTER — Other Ambulatory Visit: Payer: Self-pay | Admitting: Cardiovascular Disease

## 2024-06-17 DIAGNOSIS — E7800 Pure hypercholesterolemia, unspecified: Secondary | ICD-10-CM

## 2024-06-20 ENCOUNTER — Ambulatory Visit (HOSPITAL_BASED_OUTPATIENT_CLINIC_OR_DEPARTMENT_OTHER)

## 2024-06-30 ENCOUNTER — Other Ambulatory Visit: Payer: Self-pay | Admitting: Cardiovascular Disease

## 2024-07-01 ENCOUNTER — Other Ambulatory Visit (HOSPITAL_BASED_OUTPATIENT_CLINIC_OR_DEPARTMENT_OTHER)

## 2024-07-07 ENCOUNTER — Encounter: Admitting: Gastroenterology

## 2024-07-10 ENCOUNTER — Encounter: Admitting: Gastroenterology

## 2024-07-17 ENCOUNTER — Ambulatory Visit

## 2024-07-17 ENCOUNTER — Encounter: Payer: Self-pay | Admitting: Gastroenterology

## 2024-07-17 VITALS — Ht 64.0 in | Wt 120.0 lb

## 2024-07-17 DIAGNOSIS — R112 Nausea with vomiting, unspecified: Secondary | ICD-10-CM

## 2024-07-17 DIAGNOSIS — R1013 Epigastric pain: Secondary | ICD-10-CM

## 2024-07-17 DIAGNOSIS — K529 Noninfective gastroenteritis and colitis, unspecified: Secondary | ICD-10-CM

## 2024-07-17 MED ORDER — ONDANSETRON HCL 4 MG PO TABS
4.0000 mg | ORAL_TABLET | Freq: Three times a day (TID) | ORAL | 1 refills | Status: AC | PRN
Start: 2024-07-17 — End: ?

## 2024-07-17 NOTE — Progress Notes (Signed)
 No issues known to pt with past sedation with any surgeries or procedures Patient denies ever being told they had issues or difficulty with intubation  No FH of Malignant Hyperthermia Pt is not on diet pills nor GLP-1 medications Pt is not on home 02  Pt is not on blood thinners  Pt denies issues with chronic constipation  No A fib or A flutter Have any cardiac testing pending--no Ambulates independently

## 2024-07-18 ENCOUNTER — Encounter: Payer: Self-pay | Admitting: Gastroenterology

## 2024-07-24 ENCOUNTER — Encounter: Admitting: Gastroenterology

## 2024-07-30 ENCOUNTER — Other Ambulatory Visit: Payer: Self-pay | Admitting: Cardiovascular Disease

## 2024-07-31 ENCOUNTER — Encounter: Payer: Self-pay | Admitting: Gastroenterology

## 2024-07-31 ENCOUNTER — Ambulatory Visit: Admitting: Gastroenterology

## 2024-07-31 VITALS — BP 106/55 | HR 95 | Temp 98.0°F | Resp 17 | Ht 64.0 in | Wt 120.0 lb

## 2024-07-31 DIAGNOSIS — K5730 Diverticulosis of large intestine without perforation or abscess without bleeding: Secondary | ICD-10-CM

## 2024-07-31 DIAGNOSIS — K635 Polyp of colon: Secondary | ICD-10-CM

## 2024-07-31 DIAGNOSIS — D123 Benign neoplasm of transverse colon: Secondary | ICD-10-CM

## 2024-07-31 DIAGNOSIS — K2970 Gastritis, unspecified, without bleeding: Secondary | ICD-10-CM | POA: Diagnosis not present

## 2024-07-31 DIAGNOSIS — K529 Noninfective gastroenteritis and colitis, unspecified: Secondary | ICD-10-CM

## 2024-07-31 DIAGNOSIS — E785 Hyperlipidemia, unspecified: Secondary | ICD-10-CM | POA: Diagnosis not present

## 2024-07-31 DIAGNOSIS — R1013 Epigastric pain: Secondary | ICD-10-CM

## 2024-07-31 DIAGNOSIS — I10 Essential (primary) hypertension: Secondary | ICD-10-CM | POA: Diagnosis not present

## 2024-07-31 DIAGNOSIS — D120 Benign neoplasm of cecum: Secondary | ICD-10-CM

## 2024-07-31 DIAGNOSIS — Z1211 Encounter for screening for malignant neoplasm of colon: Secondary | ICD-10-CM | POA: Diagnosis not present

## 2024-07-31 DIAGNOSIS — R112 Nausea with vomiting, unspecified: Secondary | ICD-10-CM

## 2024-07-31 MED ORDER — SODIUM CHLORIDE 0.9 % IV SOLN: 500.0000 mL | Freq: Once | INTRAVENOUS | Status: DC

## 2024-07-31 NOTE — Patient Instructions (Signed)
 Resume previous diet  Continue present medications  Await pathology results  See handouts for gastritis, polyps and diverticulosis YOU HAD AN ENDOSCOPIC PROCEDURE TODAY AT THE [facility]:   Refer to the procedure report that was given to you for any specific questions about what was found during the examination.  If the procedure report does not answer your questions, please call your gastroenterologist to clarify.  If you requested that your care partner not be given the details of your procedure findings, then the procedure report has been included in a sealed envelope for you to review at your convenience later.  YOU SHOULD EXPECT: Some feelings of bloating in the abdomen. Passage of more gas than usual.  Walking can help get rid of the air that was put into your GI tract during the procedure and reduce the bloating. If you had a lower endoscopy (such as a colonoscopy or flexible sigmoidoscopy) you may notice spotting of blood in your stool or on the toilet paper. If you underwent a bowel prep for your procedure, you may not have a normal bowel movement for a few days.  Please Note:  You might notice some irritation and congestion in your nose or some drainage.  This is from the oxygen used during your procedure.  There is no need for concern and it should clear up in a day or so.  SYMPTOMS TO REPORT IMMEDIATELY: Following lower endoscopy (colonoscopy or flexible sigmoidoscopy):  Excessive amounts of blood in the stool  Significant tenderness or worsening of abdominal pains  Swelling of the abdomen that is new, acute  Fever of 100F or higher Following upper endoscopy (EGD)  Vomiting of blood or coffee ground material  New chest pain or pain under the shoulder blades  Painful or persistently difficult swallowing  New shortness of breath  Black, tarry-looking stools  For urgent or emergent issues, a gastroenterologist can be reached at any hour by calling [phone]. Do  not use MyChart messaging for urgent concerns.   DIET:  We do recommend a small meal at first, but then you may proceed to your regular diet.  Drink plenty of fluids but you should avoid alcoholic beverages for 24 hours.  ACTIVITY:  You should plan to take it easy for the rest of today and you should NOT DRIVE or use heavy machinery until tomorrow (because of the sedation medicines used during the test).    FOLLOW UP: Our staff will call the number listed on your records the next business day following your procedure.  We will call around 7:15- 8:00 am to check on you and address any questions or concerns that you may have regarding the information given to you following your procedure. If we do not reach you, we will leave a message.     If any biopsies were taken you will be contacted by phone or by letter within the next 1-3 weeks.  Please call us  at [phone] if you have not heard about the biopsies in 3 weeks.   SIGNATURES/CONFIDENTIALITY: You and/or your care partner have signed paperwork which will be entered into your electronic medical record.  These signatures attest to the fact that that the information above on your After Visit Summary has been reviewed and is understood.  Full responsibility of the confidentiality of this discharge information lies with you and/or your care-partner.

## 2024-07-31 NOTE — Op Note (Signed)
 [facility] Patient Name: [name] Procedure Date: 07/31/2024 10:42 AM MRN: [id number] Endoscopist: Sandor Flatter , MD, [id number] Age: 68 Referring MD:  Date of Birth: November 11, 1956 Gender: Female Account #: [id number] Procedure:                Upper GI endoscopy Indications:              Epigastric abdominal pain, Nausea Medicines:                Monitored Anesthesia Care Procedure:                Pre-Anesthesia Assessment:                           - Prior to the procedure, a History and Physical                            was performed, and patient medications and                            allergies were reviewed. The patient's tolerance of                            previous anesthesia was also reviewed. The risks                            and benefits of the procedure and the sedation                            options and risks were discussed with the patient.                            All questions were answered, and informed consent                            was obtained. Prior Anticoagulants: The patient has                            taken no anticoagulant or antiplatelet agents. ASA                            Grade Assessment: II - A patient with mild systemic                            disease. After reviewing the risks and benefits,                            the patient was deemed in satisfactory condition to                            undergo the procedure.                           After obtaining informed consent, the endoscope was  passed under direct vision. Throughout the                            procedure, the patient's blood pressure, pulse, and                            oxygen saturations were monitored continuously. The                            Olympus Scope [id number] was introduced through the                            mouth, and advanced to the third part of duodenum.                            The upper GI  endoscopy was accomplished without                            difficulty. The patient tolerated the procedure                            well. Scope In: Scope Out: Findings:                 The examined esophagus was normal.                           The gastroesophageal flap valve was visualized                            endoscopically and classified as Hill Grade III                            (minimal fold, loose to endoscope, hiatal hernia                            likely).                           Localized mild inflammation characterized by                            erythema was found in the prepyloric region of the                            stomach. Biopsies were taken with a cold forceps                            for histology and Helicobacter pylori testing.                            Estimated blood loss was minimal.                           The gastric fundus, gastric body and incisura were  normal. Additional mucosal biopsies were taken with                            a cold forceps for Helicobacter pylori testing.                            Estimated blood loss was minimal.                           The examined duodenum was normal. Complications:            No immediate complications. Estimated Blood Loss:     Estimated blood loss was minimal. Impression:               - Normal esophagus.                           - Gastroesophageal flap valve classified as Hill                            Grade III (minimal fold, loose to endoscope, hiatal                            hernia likely).                           - Gastritis. Biopsied.                           - Normal gastric fundus, gastric body and incisura.                            Biopsied.                           - Normal examined duodenum. Recommendation:           - Patient has a contact number available for                            emergencies. The signs and symptoms of potential                             delayed complications were discussed with the                            patient. Return to normal activities tomorrow.                            Written discharge instructions were provided to the                            patient.                           - Resume previous diet.                           -  Continue present medications.                           - Await pathology results.                           - Perform a colonoscopy today. Sandor Flatter, MD 07/31/2024 11:24:21 AM

## 2024-07-31 NOTE — Progress Notes (Signed)
 Called to room to assist during endoscopic procedure.  Patient ID and intended procedure confirmed with present staff. Received instructions for my participation in the procedure from the performing physician.

## 2024-07-31 NOTE — Op Note (Signed)
 [facility] Patient Name: [name] Procedure Date: 07/31/2024 10:41 AM MRN: [id number] Endoscopist: Sandor Flatter , MD, [id number] Age: 68 Referring MD:  Date of Birth: Dec 21, 1955 Gender: Female Account #: [id number] Procedure:                Colonoscopy Indications:              Screening for colorectal malignant neoplasm Medicines:                Monitored Anesthesia Care Procedure:                Pre-Anesthesia Assessment:                           - Prior to the procedure, a History and Physical                            was performed, and patient medications and                            allergies were reviewed. The patient's tolerance of                            previous anesthesia was also reviewed. The risks                            and benefits of the procedure and the sedation                            options and risks were discussed with the patient.                            All questions were answered, and informed consent                            was obtained. Prior Anticoagulants: The patient has                            taken no anticoagulant or antiplatelet agents. ASA                            Grade Assessment: II - A patient with mild systemic                            disease. After reviewing the risks and benefits,                            the patient was deemed in satisfactory condition to                            undergo the procedure.                           After obtaining informed consent, the colonoscope  was passed under direct vision. Throughout the                            procedure, the patient's blood pressure, pulse, and                            oxygen saturations were monitored continuously. The                            Olympus Scope [id number] was introduced through the                            anus and advanced to the the cecum, identified by                             appendiceal orifice and ileocecal valve. The                            colonoscopy was performed without difficulty. The                            patient tolerated the procedure well. The quality                            of the bowel preparation was good. The ileocecal                            valve, appendiceal orifice, and rectum were                            photographed. Scope In: 10:59:46 AM Scope Out: 11:16:38 AM Scope Withdrawal Time: 0 hours 12 minutes 37 seconds  Total Procedure Duration: 0 hours 16 minutes 52 seconds  Findings:                 The perianal and digital rectal examinations were                            normal.                           Two sessile polyps were found in the transverse                            colon and cecum. The polyps were 3 to 4 mm in size.                            These polyps were removed with a cold snare.                            Resection and retrieval were complete. Estimated                            blood loss was minimal.  A few small-mouthed diverticula were found in the                            sigmoid colon. The remainder of the colon was                            otherwise normal appearing.                           Retroflexion in the rectum was not performed due to                            anatomy (short, narrow rectal vault). Anterograde                            views were otherwise normal appearing. Complications:            No immediate complications. Estimated Blood Loss:     Estimated blood loss was minimal. Impression:               - Two 3 to 4 mm polyps in the transverse colon and                            in the cecum, removed with a cold snare. Resected                            and retrieved.                           - Mild diverticulosis in the sigmoid colon. Recommendation:           - Patient has a contact number available for                             emergencies. The signs and symptoms of potential                            delayed complications were discussed with the                            patient. Return to normal activities tomorrow.                            Written discharge instructions were provided to the                            patient.                           - Resume previous diet.                           - Continue present medications.                           - Await pathology results.                           -  Repeat colonoscopy for surveillance based on                            pathology results.                           - Return to GI office PRN. Sandor Flatter, MD 07/31/2024 11:28:44 AM

## 2024-07-31 NOTE — Progress Notes (Signed)
 GASTROENTEROLOGY PROCEDURE H&P NOTE   Primary Care Physician: Rollene Almarie LABOR, MD    Reason for Procedure:  Epigastric pain, nausea/vomiting, colon cancer screening  Plan:    EGD, colonoscopy  Patient is appropriate for endoscopic procedure(s) in the ambulatory ([facility]) setting.  The nature of the procedure, as well as the risks, benefits, and alternatives were carefully and thoroughly reviewed with the patient. Ample time for discussion and questions allowed. The patient understood, was satisfied, and agreed to proceed.     HPI: [name] is a 68 y.o. female who presents for EGD and colonoscopy for evaluation of nausea/vomiting, epigastric pain, along with colon cancer screening.  Last seen in GI clinic on 02/07/2024.  Since then, diarrhea has resolved.    PAST GI PROCEDURES:   Colonoscopy 10/18/2015 by Dr. Aneita: Normal colonoscopy  Recall colonoscopy 10 years    EGD 2002 by [facility]: Results not available in care everywhere or Epic  Past Medical History:  Diagnosis Date   Acid reflux    Cancer (HCC)    precancerous cervix area   Depression    Hyperlipidemia    Hypertension     Past Surgical History:  Procedure Laterality Date   APPENDECTOMY     BREAST ENHANCEMENT SURGERY     COLONOSCOPY     CYSTOSCOPY WITH RETROGRADE PYELOGRAM, URETEROSCOPY AND STENT PLACEMENT Right 10/17/2016   Procedure: CYSTOSCOPY WITH RETROGRADE PYELOGRAM, URETEROSCOPY LASER LITHOTRIPSY AND STENT PLACEMENT;  Surgeon: Morene LELON Salines, MD;  Location: [facility];  Service: Urology;  Laterality: Right;   DILATION AND CURETTAGE OF UTERUS     maxillofacial surgery     for TMJ   THUMB ARTHROSCOPY     TONSILLECTOMY     UPPER GASTROINTESTINAL ENDOSCOPY  12/04/2000   Dr Dianna    Prior to Admission medications   Medication Sig Start Date End Date Taking? Authorizing Provider  ALPRAZolam  (XANAX ) 0.25 MG tablet Take 1 tablet (0.25 mg total) by mouth 2 (two) times daily as needed for  anxiety. 03/29/21  Yes Rollene Almarie LABOR, MD  Biotin 5 MG CAPS Take 1 capsule by mouth daily.   Yes [provider]  Calcium  Citrate-Vitamin D  250-200 MG-UNIT TABS Take 1 tablet by mouth 2 (two) times daily.   Yes [provider]  Estradiol 4 MCG INST    Yes [provider]  Glucosamine Sulfate 1000 MG CAPS Take 1 capsule by mouth daily.    Yes [provider]  irbesartan  (AVAPRO ) 150 MG tablet TAKE ONE TABLET BY MOUTH DAILY 07/30/24  Yes Croitoru, Mihai, MD  Omega-3 Fatty Acids (FISH OIL PO) Take 2,000 mg by mouth 2 (two) times daily.   Yes [provider]  ondansetron  (ZOFRAN ) 4 MG tablet Take 1 tablet (4 mg total) by mouth every 8 (eight) hours as needed for nausea or vomiting. 07/17/24  Yes Reighlynn Swiney V, DO  RETIN-A 0.05 % cream Apply 1 Application topically at bedtime.   Yes [provider]  AMBIEN  CR 6.25 MG CR tablet Take 1 tablet (6.25 mg total) by mouth at bedtime as needed for sleep. Patient not taking: Reported on 07/17/2024 10/03/23   Elnor Lauraine BRAVO, NP  amLODipine  (NORVASC ) 5 MG tablet Take 1 tablet (5 mg total) by mouth daily. Patient not taking: Reported on 07/17/2024 12/24/23   Croitoru, Mihai, MD  esomeprazole  (NEXIUM ) 40 MG capsule Take 1 capsule (40 mg total) by mouth daily. Patient taking differently: Take 40 mg by mouth daily as needed.  12/30/23   Kommor, Madison, MD  EVISTA 60 MG tablet Take 60 mg by mouth daily. Patient not taking: Reported on 07/17/2024 11/15/23   [provider]  fluticasone  (FLONASE ) 50 MCG/ACT nasal spray Place 2 sprays into both nostrils daily. Patient taking differently: Place 2 sprays into both nostrils daily as needed. 08/30/22   Gladis Elsie BROCKS, PA-C  sucralfate  (CARAFATE ) 1 g tablet Take 1 tablet (1 g total) by mouth 3 (three) times daily as needed. 06/04/23   Croitoru, Mihai, MD  valACYclovir  (VALTREX ) 1000 MG tablet Take 1 tablet by mouth twice a day for 3 days then as  needed Patient taking differently: daily as needed. 05/18/22       Current Outpatient Medications  Medication Sig Dispense Refill   ALPRAZolam  (XANAX ) 0.25 MG tablet Take 1 tablet (0.25 mg total) by mouth 2 (two) times daily as needed for anxiety. 60 tablet 5   Biotin 5 MG CAPS Take 1 capsule by mouth daily.     Calcium  Citrate-Vitamin D  250-200 MG-UNIT TABS Take 1 tablet by mouth 2 (two) times daily.     Estradiol 4 MCG INST  (Patient taking differently: 2 (two) times a week.)     Glucosamine Sulfate 1000 MG CAPS Take 1 capsule by mouth daily.      irbesartan  (AVAPRO ) 150 MG tablet TAKE ONE TABLET BY MOUTH DAILY 15 tablet 0   Omega-3 Fatty Acids (FISH OIL PO) Take 2,000 mg by mouth 2 (two) times daily.     ondansetron  (ZOFRAN ) 4 MG tablet Take 1 tablet (4 mg total) by mouth every 8 (eight) hours as needed for nausea or vomiting. 30 tablet 1   RETIN-A 0.05 % cream Apply 1 Application topically at bedtime.     AMBIEN  CR 6.25 MG CR tablet Take 1 tablet (6.25 mg total) by mouth at bedtime as needed for sleep. (Patient not taking: Reported on 07/17/2024) 30 tablet 0   amLODipine  (NORVASC ) 5 MG tablet Take 1 tablet (5 mg total) by mouth daily. (Patient not taking: Reported on 07/17/2024) 90 tablet 3   esomeprazole  (NEXIUM ) 40 MG capsule Take 1 capsule (40 mg total) by mouth daily. (Patient taking differently: Take 40 mg by mouth daily as needed.) 30 capsule 0   EVISTA 60 MG tablet Take 60 mg by mouth daily. (Patient not taking: Reported on 07/17/2024)     fluticasone  (FLONASE ) 50 MCG/ACT nasal spray Place 2 sprays into both nostrils daily. (Patient taking differently: Place 2 sprays into both nostrils daily as needed.) 16 g 0   sucralfate  (CARAFATE ) 1 g tablet Take 1 tablet (1 g total) by mouth 3 (three) times daily as needed. 90 tablet 3   valACYclovir  (VALTREX ) 1000 MG tablet Take 1 tablet by mouth twice a day for 3 days then as needed (Patient taking differently: daily as needed.) 30 tablet 3    Current Facility-Administered Medications  Medication Dose Route Frequency Provider Last Rate Last Admin   0.9 %  sodium chloride  infusion  500 mL Intravenous Once Sherri Levenhagen V, DO        Allergies as of 07/31/2024 - Review Complete 07/31/2024  Allergen Reaction Noted   Augmentin  [amoxicillin -pot clavulanate] Diarrhea and Nausea And Vomiting 09/13/2022   Codeine phosphate Diarrhea and Nausea And Vomiting 02/09/2006   Darvon [propoxyphene] Diarrhea and Nausea And Vomiting 12/16/2015   Oxycodone -acetaminophen  Diarrhea and Nausea And Vomiting 02/09/2006   Propoxyphene n-acetaminophen  Diarrhea and Nausea And Vomiting 02/09/2006   Sulfamethoxazole Diarrhea and Nausea And Vomiting 02/09/2006  Family History  Problem Relation Age of Onset   Emphysema Mother    Stroke Father 10       CVA multiple   Heart disease Father    Hypertension Father    Hyperlipidemia Father    Cancer Sister        BREAST CANCER   Cancer Sister        BREAST CANCER   Alzheimer's disease Brother    Diabetes Brother    Stroke Brother    Breast cancer Maternal Aunt    Colon cancer Neg Hx    Esophageal cancer Neg Hx    Rectal cancer Neg Hx    Stomach cancer Neg Hx    Colon polyps Neg Hx     Social History   Socioeconomic History   Marital status: Divorced    Spouse name: Not on file   Number of children: 0   Years of education: Not on file   Highest education level: Some college, no degree  Occupational History   Occupation: retired  Tobacco Use   Smoking status: Former    Current packs/day: 0.00    Average packs/day: 0.3 packs/day for 4.0 years (1.2 ttl pk-yrs)    Types: Cigarettes    Start date: 12/04/1988    Quit date: 12/04/1992    Years since quitting: 31.6   Smokeless tobacco: Never  Vaping Use   Vaping status: Never Used  Substance and Sexual Activity   Alcohol use: Yes    Comment: occasional   Drug use: No   Sexual activity: Not Currently    Comment: divorce  Other Topics  Concern   Not on file  Social History Narrative   Not on file   Social Drivers of Health   Financial Resource Strain: Low Risk  (03/08/2023)   Overall Financial Resource Strain (CARDIA)    Difficulty of Paying Living Expenses: Not hard at all  Food Insecurity: No Food Insecurity (03/08/2023)   Hunger Vital Sign    Worried About Running Out of Food in the Last Year: Never true    Ran Out of Food in the Last Year: Never true  Transportation Needs: No Transportation Needs (03/08/2023)   PRAPARE - Administrator, Civil Service (Medical): No    Lack of Transportation (Non-Medical): No  Physical Activity: Insufficiently Active (03/08/2023)   Exercise Vital Sign    Days of Exercise per Week: 5 days    Minutes of Exercise per Session: 10 min  Stress: Stress Concern Present (03/08/2023)   [employer] of Occupational Health - Occupational Stress Questionnaire    Feeling of Stress : To some extent  Social Connections: Socially Isolated (03/08/2023)   Social Connection and Isolation Panel    Frequency of Communication with Friends and Family: Three times a week    Frequency of Social Gatherings with Friends and Family: Never    Attends Religious Services: Never    Database administrator or Organizations: No    Attends Engineer, structural: Not on file    Marital Status: Divorced  Intimate Partner Violence: Not At Risk (03/06/2023)   Humiliation, Afraid, Rape, and Kick questionnaire    Fear of Current or Ex-Partner: No    Emotionally Abused: No    Physically Abused: No    Sexually Abused: No    Physical Exam: Vital signs in last 24 hours: @BP  112/69   Pulse 68   Temp 98 F (36.7 C)   Ht 5' 4 (1.626 m)  Wt 120 lb (54.4 kg)   SpO2 100%   BMI 20.60 kg/m  GEN: NAD EYE: Sclerae anicteric ENT: MMM CV: Non-tachycardic Pulm: CTA b/l GI: Soft, NT/ND NEURO:  Alert & Oriented x 3   Sandor Flatter, DO [facility]   07/31/2024 10:41 AM

## 2024-07-31 NOTE — Progress Notes (Signed)
 Sedate, gd SR, tolerated procedure well, VSS, report to RN

## 2024-07-31 NOTE — Progress Notes (Signed)
 Pt's states no medical or surgical changes since previsit or office visit.

## 2024-08-01 ENCOUNTER — Telehealth: Payer: Self-pay | Admitting: *Deleted

## 2024-08-01 NOTE — Telephone Encounter (Signed)
  Follow up Call-     07/31/2024    9:33 AM  Call back number  Post procedure Call Back phone  # [phone]  Permission to leave phone message Yes     Patient questions:  Do you have a fever, pain , or abdominal swelling? No. Pain Score  0 *  Have you tolerated food without any problems? Yes.    Have you been able to return to your normal activities? Yes.    Do you have any questions about your discharge instructions: Diet   No. Medications  No. Follow up visit  No.  Do you have questions or concerns about your Care? No.  Actions: * If pain score is 4 or above: No action needed, pain <4.

## 2024-08-05 LAB — SURGICAL PATHOLOGY

## 2024-08-07 ENCOUNTER — Ambulatory Visit: Payer: Self-pay | Admitting: Gastroenterology

## 2024-08-11 ENCOUNTER — Encounter: Payer: Self-pay | Admitting: Cardiovascular Disease

## 2024-08-15 ENCOUNTER — Other Ambulatory Visit: Payer: Self-pay | Admitting: Cardiovascular Disease

## 2024-08-18 ENCOUNTER — Ambulatory Visit (HOSPITAL_BASED_OUTPATIENT_CLINIC_OR_DEPARTMENT_OTHER)
Admission: RE | Admit: 2024-08-18 | Discharge: 2024-08-18 | Disposition: A | Discharge status: Home / Self Care | Payer: Self-pay | Source: Ambulatory Visit | Attending: Cardiovascular Disease | Admitting: Cardiovascular Disease

## 2024-08-18 DIAGNOSIS — E7800 Pure hypercholesterolemia, unspecified: Secondary | ICD-10-CM | POA: Insufficient documentation

## 2024-08-19 ENCOUNTER — Ambulatory Visit: Payer: Self-pay | Admitting: Cardiovascular Disease

## 2024-08-19 ENCOUNTER — Telehealth: Payer: Self-pay | Admitting: Gastroenterology

## 2024-08-19 NOTE — Telephone Encounter (Signed)
 Pt states since her endocolon she has been having multiple loose stools.Reports if she sneeze or coughs she passes stool and can't control it. She states she has not been on any antibiotics recently. Pt wants to know what we recommend. Please advise.

## 2024-08-19 NOTE — Telephone Encounter (Signed)
 PT had ECL on 8/28. Since then she has had severe loose stools so bad that she messes up her clothes. She also has severe gas. Requesting to speak with nurse.

## 2024-08-19 NOTE — Telephone Encounter (Signed)
 Left message for pt to call back

## 2024-08-19 NOTE — Telephone Encounter (Signed)
 Sorry to hear this. Any recent antibiotic use? She can try taking some immodium to slow it down / bulk stools. If this persists she should contact us  for reassessment and be seen in the office to discuss options. thanks

## 2024-08-20 NOTE — Telephone Encounter (Signed)
 Pt reports she has not been on any antibiotics recently. Reports she has been taking Imodium and it has not helped. She is quite concerned as she states everything just runs right through her. Pt scheduled to see Alan Coombs PA tomorrow at 10:20am. Pt aware of appt.

## 2024-08-21 ENCOUNTER — Ambulatory Visit: Admitting: Physician Assistant

## 2024-08-21 ENCOUNTER — Encounter: Payer: Self-pay | Admitting: Physician Assistant

## 2024-08-21 VITALS — BP 110/78 | HR 62 | Ht 61.0 in | Wt 123.2 lb

## 2024-08-21 DIAGNOSIS — K760 Fatty (change of) liver, not elsewhere classified: Secondary | ICD-10-CM | POA: Diagnosis not present

## 2024-08-21 DIAGNOSIS — D123 Benign neoplasm of transverse colon: Secondary | ICD-10-CM

## 2024-08-21 DIAGNOSIS — R112 Nausea with vomiting, unspecified: Secondary | ICD-10-CM

## 2024-08-21 DIAGNOSIS — D519 Vitamin B12 deficiency anemia, unspecified: Secondary | ICD-10-CM | POA: Diagnosis not present

## 2024-08-21 DIAGNOSIS — R197 Diarrhea, unspecified: Secondary | ICD-10-CM

## 2024-08-21 DIAGNOSIS — R1013 Epigastric pain: Secondary | ICD-10-CM

## 2024-08-21 DIAGNOSIS — K5730 Diverticulosis of large intestine without perforation or abscess without bleeding: Secondary | ICD-10-CM

## 2024-08-21 NOTE — Progress Notes (Signed)
 08/21/2024 [name] [id number] 03/20/1956  Referring provider: Rollene Almarie LABOR, * Primary GI doctor: Dr. San  ASSESSMENT AND PLAN:  Diarrhea since the colonoscopy, extreme gas, worse after eating, imodium has not helped Mild AB cramping during BM, improved afterwards 07/31/2024 colonoscopy Dr. San good prep 2 polyps 3 to 4 mm mild diverticulosis sigmoid colon, 1 polyp TA 1 polyp HP recall 7 years Post-colonoscopy diarrhea with abdominal cramping Diarrhea and cramping likely due to gut flora disruption post-procedure. Differential includes post-infectious IBS and unlikely small intestinal bacterial overgrowth, microscopic colitis. - Recommend Align probiotic daily for 2-4 weeks. - Advise avoidance of milk and soft cheeses, recommend hard cheeses like white cheddar. - Suggest fiber supplement such as Benefiber or Citrucel. - Provided information on post-infectious IBS and dietary modifications. - Send home with Dietherx for stool studies if symptoms do not improve. - Instruct to report any new symptoms such as fever, chills, or nausea.  Epigastric pain, cyclic, associated nausea, vomiting 12/29/2023 CTAP W nonspecific enteritis 6 without diverticulitis, small HH mild hepatic steatosis small anterior pericardial effusion 07/31/2024 EGD normal esophagus Hill grade 3 gastritis normal duodenum Path negative H. Pylori -Consider HIDA scan, declines at this time  -Low fat diet -Discussed patho  B12 deficiency 12/29/2023  HGB 14.6 MCV 88.4 Platelets 318 Recent Labs    10/03/23 0201 12/08/23 0719 12/29/23 1728  HGB 13.3 12.2 14.6   Hepatic steatosis seen on CT December 29, 2023    Latest Ref Rng & Units 02/07/2024    9:23 AM 12/29/2023    5:28 PM 12/08/2023    7:19 AM  Hepatic Function  Total Protein 6.0 - 8.3 g/dL 7.8  7.8  6.3   Albumin 3.5 - 5.2 g/dL 5.0  4.7  3.6   AST 0 - 37 U/L 22  18  14    ALT 0 - 35 U/L 17  12  13    Alk Phosphatase 39 - 117 U/L 81  81   65   Total Bilirubin 0.2 - 1.2 mg/dL 1.6  2.6  1.8   Bilirubin, Direct 0.0 - 0.3 mg/dL 0.3      Platelets 681  - need LFTs and CBC monitored every 6 months, - evaluation with imaging every 2-3 years.  - Encouraged diet/exercise for modest 10% body weight loss as treatment for hepatic steatosis -Continue to work on risk factor modification including diet exercise and control of risk factors including blood sugars.  Personal history of TA polyps 07/31/2024 colonoscopy Dr. San good prep 2 polyps 3 to 4 mm mild diverticulosis sigmoid colon, 1 polyp TA 1 polyp HP recall 7 years  Patient Care Team: Rollene Almarie LABOR, MD as PCP - General (Internal Medicine) Rolan Ezra RAMAN, MD (Cardiology) Cleotilde Sewer, OD as Consulting Physician (Optometry)  HISTORY OF PRESENT ILLNESS: 68 y.o. female with a past medical history listed below presents for evaluation of diarrhea.   Patient last seen in the office 02/07/2024 by Elida Nyle Sharps for epigastric pain.  Discussed the use of AI scribe software for clinical note transcription with the patient, who gave verbal consent to proceed.  History of Present Illness   [name] is a 68 year old female who presents with diarrhea and gastrointestinal symptoms following a colonoscopy.  She experiences diarrhea and excessive gas following a colonoscopy. The diarrhea varies in nature, sometimes being explosive, sometimes resembling 'baby' stools, and occasionally leaking like water. These symptoms began post-procedure and were not present beforehand.  She  experiences cramping associated with bowel movements, which improves after defecation. Bowel movements occur multiple times daily, especially after meals, and she describes the process as rapid, stating 'it processes at three minutes'. No fevers, chills, nausea, or vomiting are present, but cramping is noted. She denies nocturnal symptoms, as they do not disturb her sleep.  Her bowel movements  were irregular before starting blood pressure medication about a year ago, which she believes improved her bowel regularity. She is currently taking Ibrastatin for hypertension.  She has not previously used probiotics and is uncertain about their benefits. She took Carafate  in January and February for stomach issues but is not currently on it.  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 Dietary habits include consuming hard cheeses like white cheddar with Triscuits without issues, and she drinks 2% milk. She is not a frequent fruit juice consumer but occasionally drinks cranberry juice.        She  reports that she quit smoking about 31 years ago. Her smoking use included cigarettes. She started smoking about 35 years ago. She has a 1.2 pack-year smoking history. She has never used smokeless tobacco. She reports current alcohol use. She reports that she does not use drugs.  RELEVANT GI HISTORY, IMAGING AND LABS: Results   DIAGNOSTIC EGD: Floppy lower esophageal sphincter     Colonoscopy 10/18/2015 by Dr. 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  CBC    Component Value Date/Time   WBC 6.3 12/29/2023 1728   RBC 4.84 12/29/2023 1728   HGB 14.6 12/29/2023 1728   HCT 42.8 12/29/2023 1728   PLT 318 12/29/2023 1728   MCV 88.4 12/29/2023 1728   MCV 87.5 10/16/2016 1832   MCH 30.2 12/29/2023 1728   MCHC 34.1 12/29/2023 1728   RDW 12.6 12/29/2023 1728   LYMPHSABS 1.3 12/08/2023 0719   MONOABS 0.5 12/08/2023 0719    EOSABS 0.1 12/08/2023 0719   BASOSABS 0.1 12/08/2023 0719   Recent Labs    10/03/23 0201 12/08/23 0719 12/29/23 1728  HGB 13.3 12.2 14.6    CMP     Component Value Date/Time   NA 139 12/29/2023 1728   NA 142 06/25/2023 0928   K 3.8 12/29/2023 1728   CL 106 12/29/2023 1728   CO2 24 12/29/2023 1728   GLUCOSE 99 12/29/2023 1728   BUN 17 12/29/2023 1728   BUN 20 06/25/2023 0928   CREATININE 0.83 12/29/2023 1728   CREATININE 0.92 12/15/2022 1640   CALCIUM  9.9 12/29/2023 1728   PROT 7.8 02/07/2024 0923   ALBUMIN 5.0 02/07/2024 0923   AST 22 02/07/2024 0923   ALT 17 02/07/2024 0923   ALKPHOS 81 02/07/2024 0923   BILITOT 1.6 (H) 02/07/2024 0923   GFRNONAA >60 12/29/2023 1728   GFRNONAA 59 (L) 10/16/2016 1820   GFRAA >60 07/10/2019 1721   GFRAA 67 10/16/2016 1820      Latest Ref Rng & Units 02/07/2024    9:23 AM 12/29/2023    5:28 PM 12/08/2023    7:19 AM  Hepatic Function  Total Protein 6.0 - 8.3 g/dL  7.8  7.8  6.3   Albumin 3.5 - 5.2 g/dL 5.0  4.7  3.6   AST 0 - 37 U/L 22  18  14    ALT 0 - 35 U/L 17  12  13    Alk Phosphatase 39 - 117 U/L 81  81  65   Total Bilirubin 0.2 - 1.2 mg/dL 1.6  2.6  1.8   Bilirubin, Direct 0.0 - 0.3 mg/dL 0.3         Current Medications:    Current Outpatient Medications (Cardiovascular):    amLODipine  (NORVASC ) 5 MG tablet, Take 1 tablet (5 mg total) by mouth daily.   irbesartan  (AVAPRO ) 150 MG tablet, TAKE ONE TABLET BY MOUTH DAILY. NEEDS APPOINTMENT FOR FURTHER REFILLS.  Current Outpatient Medications (Respiratory):    fluticasone  (FLONASE ) 50 MCG/ACT nasal spray, Place 2 sprays into both nostrils daily. (Patient taking differently: Place 2 sprays into both nostrils daily as needed.)    Current Outpatient Medications (Other):    ALPRAZolam  (XANAX ) 0.25 MG tablet, Take 1 tablet (0.25 mg total) by mouth 2 (two) times daily as needed for anxiety.   Biotin 5 MG CAPS, Take 1 capsule by mouth daily.   Calcium  Citrate-Vitamin D  250-200  MG-UNIT TABS, Take 1 tablet by mouth 2 (two) times daily.   esomeprazole  (NEXIUM ) 40 MG capsule, Take 1 capsule (40 mg total) by mouth daily. (Patient taking differently: Take 40 mg by mouth daily as needed.)   Estradiol 4 MCG INST,    Glucosamine Sulfate 1000 MG CAPS, Take 1 capsule by mouth daily.    Omega-3 Fatty Acids (FISH OIL PO), Take 2,000 mg by mouth 2 (two) times daily.   ondansetron  (ZOFRAN ) 4 MG tablet, Take 1 tablet (4 mg total) by mouth every 8 (eight) hours as needed for nausea or vomiting.   RETIN-A 0.05 % cream, Apply 1 Application topically at bedtime.   sucralfate  (CARAFATE ) 1 g tablet, Take 1 tablet (1 g total) by mouth 3 (three) times daily as needed.   valACYclovir  (VALTREX ) 1000 MG tablet, Take 1 tablet by mouth twice a day for 3 days then as needed (Patient taking differently: daily as needed.)  Medical History:  Past Medical History:  Diagnosis Date   Acid reflux    Cancer (HCC)    precancerous cervix area   Depression    Hyperlipidemia    Hypertension    Allergies:  Allergies  Allergen Reactions   Augmentin  [Amoxicillin -Pot Clavulanate] Diarrhea and Nausea And Vomiting   Codeine Phosphate Diarrhea and Nausea And Vomiting   Darvon [Propoxyphene] Diarrhea and Nausea And Vomiting   Oxycodone -Acetaminophen  Diarrhea and Nausea And Vomiting   Propoxyphene N-Acetaminophen  Diarrhea and Nausea And Vomiting   Sulfamethoxazole Diarrhea and Nausea And Vomiting     Surgical History:  She  has a past surgical history that includes Dilation and curettage of uterus; Appendectomy; Tonsillectomy; Breast enhancement surgery; maxillofacial surgery; Upper gastrointestinal endoscopy (12/04/2000); Cystoscopy with retrograde pyelogram, ureteroscopy and stent placement (Right, 10/17/2016); Colonoscopy; and Thumb arthroscopy. Family History:  Her family history includes Alzheimer's disease in her brother; Breast cancer in her maternal aunt; Cancer in her sister and sister; Diabetes  in her brother; Emphysema in her mother; Heart disease in her father; Hyperlipidemia in her father; Hypertension in her father; Stroke in her brother; Stroke (age of onset: 76) in her father.  REVIEW OF SYSTEMS  : All other systems reviewed and negative except where noted in the History of Present Illness.  PHYSICAL EXAM: BP 110/78  Pulse 62   Ht 5' 1 (1.549 m)   Wt 123 lb 4 oz (55.9 kg)   BMI 23.29 kg/m  Physical Exam   GENERAL APPEARANCE: Well nourished, in no apparent distress HEENT: No cervical lymphadenopathy, unremarkable thyroid , sclerae anicteric, conjunctiva pink RESPIRATORY: Respiratory effort normal, BS equal bilateral without rales, rhonchi, wheezing CARDIO: RRR with no MRGs, peripheral pulses intact ABDOMEN: Soft, non distended, active bowel sounds in all 4 quadrants, no tenderness to palpation, no rebound, no mass appreciated RECTAL: declines MUSCULOSKELETAL: Full ROM, normal gait, without edema SKIN: Dry, intact without rashes or lesions. No jaundice. NEURO: Alert, oriented, no focal deficits PSYCH: Cooperative, normal mood and affect.      Alan JONELLE Coombs, PA-C 11:03 AM

## 2024-08-21 NOTE — Patient Instructions (Addendum)
 First do a trial off milk/lactose products if you use them.  Add on allign probiotic for 1 month only Add fiber like benefiber or citracel once a day Do IBS diet below for a while If it is not better than please do the diatherix below  Consider HIDA scan to evaluate your gallbladder if it helps.  - Can try loperamide 4 mg initially, then 2 mg after each unformed stool for =2 days, with a maximum of 16 mg/day.  - If loperamide is not working, you could try bismuth salicylate (Pepto-Bismol) 30 mL or two tablets every 30 minutes for eight doses. Pepto-Bismol may make your stools black.   Go to the ER if any severe abdominal pain, fever, or weakness.    We are ordering a Diatherix stool testing for you to take home and complete.  You have received a kit from our office today containing all necessary supplies to complete this test.  Please carefully read the stool collection instructions provided in the kit before opening the accompanying materials  OR an easier way is to please use toilet paper to wipe after your bowel movement and use the qtip/applicator provided to get a small volume of the stool from the toilet paper and place that in the tube.   Important to remember: -Place the label onto the [facility] opti-swab TUBE. -This label should include your full name and date of birth.  - This label should have the DATE AND TIME stool was collects -After completing the test, you should secure the tube into the specimen biohazard bag.  -The laboratory request information sheet (including date and time of specimen collection) should be placed into the outside pocket of the specimen biohazard bag and returned to the [facility] lab with 2 days of collection.   If it is greater than two days from collection you will be asked to repeat the test.  If the label is missing from the tube with your name, date of birth, date and time of collection on it, you will have to repeat the test.  Any questions  please message us  on my chart or call the office at [phone]   FODMAP stands for fermentable oligo-, di-, mono-saccharides and polyols (1). These are the scientific terms used to classify groups of carbs that are difficult for our body to digest and that are notorious for triggering digestive symptoms like bloating, gas, loose stools and stomach pain.   You can try low FODMAP diet  - start with eliminating just one column at a time that you feel may be a trigger for you. - the table at the very bottom contains foods that are low in FODMAPs   Sometimes trying to eliminate the FODMAP's from your diet is difficult or tricky, if you are stuggling with trying to do the elimination diet you can try an enzyme.  There is a food enzymes that you sprinkle in or on your food that helps break down the FODMAP. You can read more about the enzyme by going to this site: https://fodzyme.com/    There is a test for this we can do called a breath test, if you are positive we will treat you with an antibiotic to see if it helps.  Your symptoms are very suspicious for this condition, as discussed, we will start you on an probiotic to see if this helps.   Small intestinal bacterial overgrowth (SIBO) occurs when there is an abnormal increase in the overall bacterial population in the small  intestine -- particularly types of bacteria not commonly found in that part of the digestive tract. Small intestinal bacterial overgrowth (SIBO) commonly results when a circumstance -- such as surgery or disease -- slows the passage of food and waste products in the digestive tract, creating a breeding ground for bacteria.  Signs and symptoms of SIBO often include: Loss of appetite Abdominal pain Nausea Bloating An uncomfortable feeling of fullness after eating Diarrhea or constipation, depending on the type of gas produced  What foods trigger SIBO? While foods aren't the original cause of SIBO, certain foods do  encourage the overgrowth of the wrong bacteria in your small intestine. If you're feeding them their favorite foods, they're going to grow more, and that will trigger more of your SIBO symptoms. By the same token, you can help reduce the overgrowth by starving the problematic bacteria of their favorite foods. This strategy has led to a number of proposed SIBO eating plans. The plans vary, and so do individual results. But in general, they tend to recommend limiting carbohydrates.  These include: Sugars and sweeteners. Fruits and starchy vegetables. Dairy products. Grains.  There is a test for this we can do called a breath test, if you are positive we will treat you with an antibiotic to see if it helps.  Your symptoms are very suspicious for this condition, as discussed, we will start you on an antibiotic to see if this helps.   Thank you for trusting me with your gastrointestinal care!   Alan Coombs, PA-C   _______________________________________________________  If your blood pressure at your visit was 140/90 or greater, please contact your primary care physician to follow up on this.  _______________________________________________________  If you are age 65 or older, your body mass index should be between 23-30. Your Body mass index is 23.29 kg/m. If this is out of the aforementioned range listed, please consider follow up with your Primary Care Provider.  If you are age 60 or younger, your body mass index should be between 19-25. Your Body mass index is 23.29 kg/m. If this is out of the aformentioned range listed, please consider follow up with your Primary Care Provider.   ________________________________________________________  The [facility] providers would like to encourage you to use MYCHART to communicate with providers for non-urgent requests or questions.  Due to long hold times on the telephone, sending your provider a message by [facility] may be a faster and more  efficient way to get a response.  Please allow 48 business hours for a response.  Please remember that this is for non-urgent requests.  _______________________________________________________  [facility] is using a team-based approach to care.  Your team is made up of your doctor and two to three APPS. Our APPS (Nurse Practitioners and Physician Assistants) work with your physician to ensure care continuity for you. They are fully qualified to address your health concerns and develop a treatment plan. They communicate directly with your gastroenterologist to care for you. Seeing the Advanced Practice Practitioners on your physician's team can help you by facilitating care more promptly, often allowing for earlier appointments, access to diagnostic testing, procedures, and other specialty referrals.

## 2024-08-25 NOTE — Progress Notes (Signed)
 Agree with the assessment and plan as outlined by Quentin Mulling, PA-C. ? ?Keron Neenan, DO, FACG ? ?

## 2024-09-08 ENCOUNTER — Other Ambulatory Visit (HOSPITAL_COMMUNITY): Payer: Self-pay

## 2024-09-08 MED ORDER — FLUZONE HIGH-DOSE 0.5 ML IM SUSY
0.5000 mL | PREFILLED_SYRINGE | Freq: Once | INTRAMUSCULAR | 0 refills | Status: AC
  Filled 2024-09-08: qty 0.5, 1d supply, fill #0

## 2024-09-22 NOTE — Telephone Encounter (Signed)
 Patient reports that she took statins in the past, but it caused severe muscle pain and cramps.

## 2024-10-15 ENCOUNTER — Telehealth: Payer: Self-pay

## 2024-10-15 NOTE — Telephone Encounter (Signed)
 Patient is overdue for an appointment. LMOM for patient to call and schedule.

## 2024-10-16 ENCOUNTER — Telehealth: Payer: Self-pay

## 2024-10-16 NOTE — Telephone Encounter (Signed)
 Copied from CRM #[id number]. Topic: General - Call Back - No Documentation >> Oct 16, 2024 12:29 PM Ashley R wrote: Reason for CRM: Missed call yesterday, would like more details on nature of visit to be scheduled. Works psychologist, sport and exercise, would prefer a fpl group or detailed VM to callback and have this scheduled.

## 2024-10-16 NOTE — Telephone Encounter (Signed)
 LVM for pt that she is overdue for an office visit. Pt can call and schedule with the front desk.

## 2024-10-21 ENCOUNTER — Telehealth: Payer: Self-pay

## 2024-10-21 NOTE — Telephone Encounter (Signed)
 Patient has not been seen in over a year. They are on the list from [employer] that they need an appt. I have called them and left them a message. If they call back please get them scheduled to see their PCP. If they are no longer a pt please tell them to let [employer] know and remove the current PCP.

## 2024-10-27 ENCOUNTER — Encounter: Payer: Self-pay | Admitting: Cardiovascular Disease

## 2024-10-27 ENCOUNTER — Ambulatory Visit: Attending: Cardiovascular Disease | Admitting: Cardiovascular Disease

## 2024-10-27 ENCOUNTER — Other Ambulatory Visit (HOSPITAL_COMMUNITY): Payer: Self-pay

## 2024-10-27 ENCOUNTER — Telehealth: Payer: Self-pay

## 2024-10-27 ENCOUNTER — Encounter: Payer: Self-pay | Admitting: Emergency Medicine

## 2024-10-27 VITALS — BP 124/79 | HR 63 | Ht 64.0 in | Wt 122.0 lb

## 2024-10-27 DIAGNOSIS — E7800 Pure hypercholesterolemia, unspecified: Secondary | ICD-10-CM

## 2024-10-27 DIAGNOSIS — R931 Abnormal findings on diagnostic imaging of heart and coronary circulation: Secondary | ICD-10-CM | POA: Diagnosis not present

## 2024-10-27 DIAGNOSIS — I10 Essential (primary) hypertension: Secondary | ICD-10-CM | POA: Diagnosis not present

## 2024-10-27 DIAGNOSIS — T466X5D Adverse effect of antihyperlipidemic and antiarteriosclerotic drugs, subsequent encounter: Secondary | ICD-10-CM

## 2024-10-27 DIAGNOSIS — G720 Drug-induced myopathy: Secondary | ICD-10-CM

## 2024-10-27 LAB — LIPID PANEL
Chol/HDL Ratio: 3.7 ratio (ref 0.0–4.4)
Cholesterol, Total: 194 mg/dL (ref 100–199)
HDL: 53 mg/dL (ref >39)
LDL Chol Calc (NIH): 130 mg/dL — ABNORMAL HIGH (ref 0–99)
Triglycerides: 60 mg/dL (ref 0–149)
VLDL Cholesterol Cal: 11 mg/dL (ref 5–40)

## 2024-10-27 NOTE — Telephone Encounter (Signed)
-----   Message from Nurse Comer F sent at 10/27/2024  9:27 AM EST ----- Regarding: PA for Repatha needed PA for Repatha needed, please. Once it is approved (hopefully) can you send it to pharmacy to fill the prescription. Thank you

## 2024-10-27 NOTE — Patient Instructions (Addendum)
 Medication Instructions:  Plan to start Repatha- Pharmacy will send in prescription at your appointment *If you need a refill on your cardiac medications before your next appointment, please call your pharmacy*  Lab Work: Fasting Lipid panel If you have labs (blood work) drawn today and your tests are completely normal, you will receive your results only by: MyChart Message (if you have MyChart) OR A paper copy in the mail If you have any lab test that is abnormal or we need to change your treatment, we will call you to review the results.  Testing/Procedures: None ordered  Follow-Up: At [facility], you and your health needs are our priority.  As part of our continuing mission to provide you with exceptional heart care, our providers are all part of one team.  This team includes your primary Cardiologist (physician) and Advanced Practice Providers or APPs (Physician Assistants and Nurse Practitioners) who all work together to provide you with the care you need, when you need it.  Your next appointment:   Needs next available PharmD appointment for Lipids/Repatha  Dr Francyne- 1 year  We recommend signing up for the patient portal called MyChart.  Sign up information is provided on this After Visit Summary.  MyChart is used to connect with patients for Virtual Visits (Telemedicine).  Patients are able to view lab/test results, encounter notes, upcoming appointments, etc.  Non-urgent messages can be sent to your provider as well.   To learn more about what you can do with MyChart, go to [email or web address].

## 2024-10-27 NOTE — Telephone Encounter (Signed)
 Pharmacy Patient Advocate Encounter   Received notification from Physician's Office that prior authorization for REPATHA is required/requested.   Insurance verification completed.   The patient is insured through [facility].   Per test claim: PA required; PA submitted to above mentioned insurance via Latent Key/confirmation #/EOC [id number] Status is pending

## 2024-10-27 NOTE — Progress Notes (Signed)
 Cardiology Office Note:    Date:  10/27/2024   ID:  [name], DOB 01-22-1956, MRN [id number]  PCP:  Rollene Almarie LABOR, MD   [facility] Providers Cardiologist:  None     Referring MD: Rollene Almarie LABOR, *   Chief Complaint  Patient presents with   Hypertension   Hyperlipidemia  [name] is a 68 y.o. female who is being seen today for the evaluation of recent onset hypertension at the request of Rollene Almarie LABOR, *.   History of Present Illness:    [name] is a 68 y.o. female with a hx of generally excellent health, moderate hypercholesterolemia, HTN and found to have an elevated coronary calcium  score.  She feels much better now that her blood pressure is well-controlled.  After trying several medications we finally found a combination that works well and does not produce side effects.  She had indigestion and diarrhea with lisinopril .  She had lower extremity edema with amlodipine .  She is doing well on irbesartan .  Blood pressure was mildly elevated when she first arrived today, but when I rechecked it it was 124/79.  A few weeks ago in the doctor's office her blood pressure was 110/78.  She reports that her home blood pressure cuff often reports systolic blood pressure in the low 140s but she has never had it checked in the office.  Coronary calcium  score was elevated at 54, placing her in the 68th percentile for age.  Her most recent lipid profile showed an LDL cholesterol of 140, albeit with a good HDL of 58.  She does not have diabetes or hypertriglyceridemia.  In the past she has been treated with a variety of medications for her hypercholesterolemia including rosuvastatin , simvastatin , Zetia  monotherapy and Vytorin .  She had problems with muscular aches and pains with all these medications.  At one point she found it hard to walk.  She is not currently on any lipid-lowering medications.  Although she does not engage in routine exercise  for health, she is physically active and takes care of all her own yard work and keeps a garden.  She thinks she gets 2-3 hours of exercise that way weekly.  She is quite lean with a BMI of 21.  She appears fit.  She denies chest pain or shortness of breath, leg edema, claudication, focal neurological complaints, syncope, dizziness or palpitations.  Workup for relatively late onset hypertension did not disclose evidence of renal artery stenosis or hyperaldosteronism.    Her father had a history of multiple strokes starting in his early 47s but lived to his 3s and died of a cerebral infarction.  He may have had high blood pressure.  Her mother and her siblings have been healthy and have not had high blood pressure.    In 2006 she had vertigo possible syncope and underwent extensive workup for cerebrovascular disease.  MRI raised concern for possible right vertebral basilar stenosis so she underwent invasive angiography invasive angiography of the carotids and vertebrals.  Findings were normal.  There was a possible venous angioma in the right frontal supraorbital area.  In 2017 she had mild right-sided hydronephrosis from an obstructive stone.  In 2020 she had evidence of descending colitis, felt to be infectious.  Past Medical History:  Diagnosis Date   Acid reflux    Cancer (HCC)    precancerous cervix area   Depression    Hyperlipidemia    Hypertension     Past Surgical  History:  Procedure Laterality Date   APPENDECTOMY     BREAST ENHANCEMENT SURGERY     COLONOSCOPY     CYSTOSCOPY WITH RETROGRADE PYELOGRAM, URETEROSCOPY AND STENT PLACEMENT Right 10/17/2016   Procedure: CYSTOSCOPY WITH RETROGRADE PYELOGRAM, URETEROSCOPY LASER LITHOTRIPSY AND STENT PLACEMENT;  Surgeon: Morene LELON Salines, MD;  Location: [facility];  Service: Urology;  Laterality: Right;   DILATION AND CURETTAGE OF UTERUS     maxillofacial surgery     for TMJ   THUMB ARTHROSCOPY     TONSILLECTOMY     UPPER GASTROINTESTINAL  ENDOSCOPY  12/04/2000   Dr Dianna    Current Medications: Current Meds  Medication Sig   ALPRAZolam  (XANAX ) 0.25 MG tablet Take 1 tablet (0.25 mg total) by mouth 2 (two) times daily as needed for anxiety.   Biotin 5 MG CAPS Take 1 capsule by mouth daily.   Calcium  Citrate-Vitamin D  250-200 MG-UNIT TABS Take 1 tablet by mouth 2 (two) times daily.   esomeprazole  (NEXIUM ) 40 MG capsule Take 1 capsule (40 mg total) by mouth daily. (Patient taking differently: Take 40 mg by mouth daily as needed.)   fluticasone  (FLONASE ) 50 MCG/ACT nasal spray Place 2 sprays into both nostrils daily. (Patient taking differently: Place 2 sprays into both nostrils daily as needed.)   Glucosamine Sulfate 1000 MG CAPS Take 1 capsule by mouth daily.    irbesartan  (AVAPRO ) 150 MG tablet TAKE ONE TABLET BY MOUTH DAILY. NEEDS APPOINTMENT FOR FURTHER REFILLS.   Omega-3 Fatty Acids (FISH OIL PO) Take 2,000 mg by mouth 2 (two) times daily.   ondansetron  (ZOFRAN ) 4 MG tablet Take 1 tablet (4 mg total) by mouth every 8 (eight) hours as needed for nausea or vomiting.   raloxifene (EVISTA) 60 MG tablet Take 60 mg by mouth daily.   RETIN-A 0.05 % cream Apply 1 Application topically at bedtime.   sucralfate  (CARAFATE ) 1 g tablet Take 1 tablet (1 g total) by mouth 3 (three) times daily as needed.   valACYclovir  (VALTREX ) 1000 MG tablet Take 1 tablet by mouth twice a day for 3 days then as needed (Patient taking differently: daily as needed.)     Allergies:   Augmentin  [amoxicillin -pot clavulanate], Codeine phosphate, Darvon [propoxyphene], Oxycodone -acetaminophen , Propoxyphene n-acetaminophen , and Sulfamethoxazole   Family History: The patient's family history includes Alzheimer's disease in her brother; Breast cancer in her maternal aunt; Cancer in her sister and sister; Diabetes in her brother; Emphysema in her mother; Heart disease in her father; Hyperlipidemia in her father; Hypertension in her father; Stroke in her brother;  Stroke (age of onset: 38) in her father. There is no history of Colon cancer, Esophageal cancer, Rectal cancer, Stomach cancer, or Colon polyps.  ROS:   Please see the history of present illness.     All other systems reviewed and are negative.  EKGs/Labs/Other Studies Reviewed:    The following studies were reviewed today: Coronary CT calcium  score 08/18/2024  Renal duplex ultrasound 04/09/2023 without significant renal artery stenosis. Cholesterol profile over the last several years CT, MRA, MRI, invasive angiography of the carotids and vertebrals from 2006 were reviewed  The CT of the abdomen and pelvis for suspected diverticulitis 2020 does not describe aortic or branch atherosclerosis and showed negative adrenals.  There was no evidence of aortic aneurysm.   EKG:  NSR, normal tracing today.  QTc 419 ms  EKG Interpretation Date/Time:    Ventricular Rate:    PR Interval:    QRS Duration:    QT  Interval:    QTC Calculation:   R Axis:      Text Interpretation:           Recent Labs: 12/29/2023: BUN 17; Creatinine, Ser 0.83; Hemoglobin 14.6; Platelets 318; Potassium 3.8; Sodium 139 02/07/2024: ALT 17  Recent Lipid Panel    Component Value Date/Time   CHOL 217 (H) 03/26/2023 1000   TRIG 95.0 03/26/2023 1000   HDL 57.80 03/26/2023 1000   CHOLHDL 4 03/26/2023 1000   VLDL 19.0 03/26/2023 1000   LDLCALC 140 (H) 03/26/2023 1000     Risk Assessment/Calculations:                Physical Exam:    VS:  BP 124/79   Pulse 63   Ht 5' 4 (1.626 m)   Wt 122 lb (55.3 kg)   SpO2 99%   BMI 20.94 kg/m     Wt Readings from Last 3 Encounters:  10/27/24 122 lb (55.3 kg)  08/21/24 123 lb 4 oz (55.9 kg)  07/31/24 120 lb (54.4 kg)     GEN: Very thin and fit appearing, younger than stated age, well nourished, well developed in no acute distress HEENT: Normal NECK: No JVD; No carotid bruits LYMPHATICS: No lymphadenopathy CARDIAC: RRR, no murmurs, rubs,  gallops RESPIRATORY:  Clear to auscultation without rales, wheezing or rhonchi  ABDOMEN: Soft, non-tender, non-distended.  No abdominal bruit heard. MUSCULOSKELETAL:  No edema; No deformity  SKIN: Warm and dry NEUROLOGIC:  Alert and oriented x 3 PSYCHIATRIC:  Normal affect   ASSESSMENT:    1. Essential hypertension   2. Hypercholesterolemia   3. Statin myopathy   4. Elevated coronary artery calcium  score     PLAN:    In order of problems listed above:  HTN: Well-controlled on moderate dose of irbesartan .  Normal renal function parameters. HLP: Coronary calcium  score is higher than average and it is important to bring the LDL cholesterol less than 899.  She has had significant problems with statin myopathy with multiple agents (rosuvastatin , simvastatin  monotherapy, Zetia  monotherapy, Vytorin ).  I think she is a great candidate for PCSK9 inhibitor therapy.  Will update her lipid profile and refer to our Pharm.D.           Medication Adjustments/Labs and Tests Ordered: Current medicines are reviewed at length with the patient today.  Concerns regarding medicines are outlined above.  Orders Placed This Encounter  Procedures   Lipid panel   AMB Referral to [facility] Pharm-D   EKG 12-Lead   No orders of the defined types were placed in this encounter.   Patient Instructions  Medication Instructions:  Plan to start Repatha- Pharmacy will send in prescription at your appointment *If you need a refill on your cardiac medications before your next appointment, please call your pharmacy*  Lab Work: Fasting Lipid panel If you have labs (blood work) drawn today and your tests are completely normal, you will receive your results only by: MyChart Message (if you have MyChart) OR A paper copy in the mail If you have any lab test that is abnormal or we need to change your treatment, we will call you to review the results.  Testing/Procedures: None ordered  Follow-Up: At [facility], you and your health needs are our priority.  As part of our continuing mission to provide you with exceptional heart care, our providers are all part of one team.  This team includes your primary Cardiologist (physician) and Advanced Practice Providers or APPs (  Physician Assistants and Nurse Practitioners) who all work together to provide you with the care you need, when you need it.  Your next appointment:   Needs next available PharmD appointment for Lipids/Repatha  Dr Francyne- 1 year  We recommend signing up for the patient portal called MyChart.  Sign up information is provided on this After Visit Summary.  MyChart is used to connect with patients for Virtual Visits (Telemedicine).  Patients are able to view lab/test results, encounter notes, upcoming appointments, etc.  Non-urgent messages can be sent to your provider as well.   To learn more about what you can do with MyChart, go to [email or web address].      Signed, Jerel Francyne, MD  10/27/2024 9:36 AM    [facility]

## 2024-10-28 ENCOUNTER — Ambulatory Visit: Payer: Self-pay | Admitting: Cardiovascular Disease

## 2024-10-29 NOTE — Telephone Encounter (Signed)
 Pharmacy Patient Advocate Encounter  Received notification from [employer] that Prior Authorization for REPATHA has been APPROVED from 10/28/24 to 04/27/25

## 2024-11-02 MED ORDER — REPATHA SURECLICK 140 MG/ML ~~LOC~~ SOAJ: 140.0000 mg | SUBCUTANEOUS | 3 refills | Status: AC

## 2024-11-02 NOTE — Addendum Note (Signed)
 Addended by: Meelah Tallo L on: 11/02/2024 01:07 PM   Modules accepted: Orders

## 2024-11-03 ENCOUNTER — Encounter: Payer: Self-pay | Admitting: Cardiovascular Disease

## 2024-11-12 ENCOUNTER — Telehealth: Payer: Self-pay | Admitting: Pharmacy Technician

## 2024-11-12 ENCOUNTER — Other Ambulatory Visit (HOSPITAL_COMMUNITY): Payer: Self-pay

## 2024-11-12 ENCOUNTER — Other Ambulatory Visit: Payer: Self-pay | Admitting: Cardiovascular Disease

## 2024-11-12 NOTE — Telephone Encounter (Signed)
° °  Patient Advocate Encounter   The patient was approved for a [employer] grant that will help cover the cost of repatha  Total amount awarded, 2500.  Effective: 10/13/24 - 10/12/25   APW:[id number] ERW:[id number] Hmnle:[id number] PI:[id number] [employer] ID: [id number]   Pharmacy provided with approval and processing information. Patient informed via mychart

## 2024-11-24 ENCOUNTER — Other Ambulatory Visit: Payer: Self-pay

## 2024-12-10 ENCOUNTER — Ambulatory Visit: Payer: Self-pay

## 2024-12-10 ENCOUNTER — Telehealth: Admitting: Emergency Medicine

## 2024-12-10 DIAGNOSIS — J111 Influenza due to unidentified influenza virus with other respiratory manifestations: Secondary | ICD-10-CM

## 2024-12-10 MED ORDER — SPACER/AERO-HOLDING CHAMBERS DEVI: 1.0000 | 0 refills | Status: AC | PRN

## 2024-12-10 MED ORDER — ALBUTEROL SULFATE HFA 108 (90 BASE) MCG/ACT IN AERS: 2.0000 | INHALATION_SPRAY | Freq: Four times a day (QID) | RESPIRATORY_TRACT | 0 refills | Status: AC | PRN

## 2024-12-10 MED ORDER — FLUTICASONE PROPIONATE HFA 44 MCG/ACT IN AERO: 2.0000 | INHALATION_SPRAY | Freq: Two times a day (BID) | RESPIRATORY_TRACT | 0 refills | Status: AC

## 2024-12-10 MED ORDER — NYSTATIN 100000 UNIT/ML MT SUSP: 5.0000 mL | Freq: Three times a day (TID) | OROMUCOSAL | 0 refills | Status: AC | PRN

## 2024-12-10 NOTE — Progress Notes (Signed)
 " Virtual Visit Consent   [name], you are scheduled for a virtual visit with a [facility] provider today. Just as with appointments in the office, your consent must be obtained to participate. Your consent will be active for this visit and any virtual visit you may have with one of our providers in the next 365 days. If you have a MyChart account, a copy of this consent can be sent to you electronically.  As this is a virtual visit, video technology does not allow for your provider to perform a traditional examination. This may limit your provider's ability to fully assess your condition. If your provider identifies any concerns that need to be evaluated in person or the need to arrange testing (such as labs, EKG, etc.), we will make arrangements to do so. Although advances in technology are sophisticated, we cannot ensure that it will always work on either your end or our end. If the connection with a video visit is poor, the visit may have to be switched to a telephone visit. With either a video or telephone visit, we are not always able to ensure that we have a secure connection.  By engaging in this virtual visit, you consent to the provision of healthcare and authorize for your insurance to be billed (if applicable) for the services provided during this visit. Depending on your insurance coverage, you may receive a charge related to this service.  I need to obtain your verbal consent now. Are you willing to proceed with your visit today? [name] has provided verbal consent on 12/10/2024 for a virtual visit (video or telephone). Jon CHRISTELLA Belt, NP  Date: 12/10/2024 2:40 PM   Virtual Visit via Video Note   I, Jon CHRISTELLA Belt, connected with  [name]  ([id number], 1956/01/23) on 12/10/2024 at  2:30 PM EST by a video-enabled telemedicine application and verified that I am speaking with the correct person using two identifiers.  Location: Patient: Virtual Visit Location Patient:  Home Provider: Virtual Visit Location Provider: Home Office   I discussed the limitations of evaluation and management by telemedicine and the availability of in person appointments. The patient expressed understanding and agreed to proceed.    History of Present Illness: [name] is a 69 y.o. who identifies as a female who was assigned female at birth, and is being seen today for uri.   Dry cough since 12/06/24. FEver 101 on 12/07/24. Took home tests for flu, some negative and one is positive. Temp today only 9F. Nasal Congestion. Is wheezing, but not sob. No hx asthma.   Taking afrin. No saline nasal spray use.   HPI: HPI  Problems:  Patient Active Problem List   Diagnosis Date Noted   Abdominal pain, epigastric 02/08/2024   Primary hypertension 03/26/2023   Diarrhea of presumed infectious origin 12/15/2022   Carpal tunnel syndrome 09/13/2022   Generalized anxiety disorder 09/13/2022   Osteopenia 09/13/2022   Stenosing tenosynovitis of thumb 09/13/2022   Sinus pain 09/13/2022   Subacute cough 04/20/2022   At high risk for breast cancer 02/03/2020   Routine general medical examination at a health care facility 10/24/2019   Acute recurrent pansinusitis 03/25/2019   Seasonal allergic rhinitis 03/25/2019   B12 deficiency 08/13/2018   Insomnia 06/14/2018   Hair loss 09/27/2017   OA (osteoarthritis) of knee 03/20/2017   Swelling of finger joint of left hand 10/25/2015   Trigger finger, acquired 10/13/2014   Palpitations 10/21/2013   Anxiety  Hyperlipidemia 06/25/2009    Allergies: Allergies[1] Medications: Current Medications[2]  Observations/Objective: Patient is well-developed, well-nourished in no acute distress.  Resting comfortably  at home.  Head is normocephalic, atraumatic.  No labored breathing. Occasional coughing spasm Speech is clear and coherent with logical content.  Patient is alert and oriented at baseline.    Assessment and Plan: 1. Influenza  (Primary)  Is about 5 days out from initial sx. Discussed tamiflu  not likely to help. Discussed supportive care. Declines offer of cough medicine. Declines po prednisone , agrees to try flovent  but asks for magic mouthwash as she is prone to thrush.   Follow Up Instructions: I discussed the assessment and treatment plan with the patient. The patient was provided an opportunity to ask questions and all were answered. The patient agreed with the plan and demonstrated an understanding of the instructions.  A copy of instructions were sent to the patient via MyChart unless otherwise noted below.   The patient was advised to call back or seek an in-person evaluation if the symptoms worsen or if the condition fails to improve as anticipated.    Jon CHRISTELLA Belt, NP    [1]  Allergies Allergen Reactions   Augmentin  [Amoxicillin -Pot Clavulanate] Diarrhea and Nausea And Vomiting   Codeine Phosphate Diarrhea and Nausea And Vomiting   Darvon [Propoxyphene] Diarrhea and Nausea And Vomiting   Oxycodone -Acetaminophen  Diarrhea and Nausea And Vomiting   Propoxyphene N-Acetaminophen  Diarrhea and Nausea And Vomiting   Sulfamethoxazole Diarrhea and Nausea And Vomiting  [2]  Current Outpatient Medications:    albuterol  (VENTOLIN  HFA) 108 (90 Base) MCG/ACT inhaler, Inhale 2 puffs into the lungs every 6 (six) hours as needed for wheezing or shortness of breath., Disp: 17 each, Rfl: 0   fluticasone  (FLOVENT  HFA) 44 MCG/ACT inhaler, Inhale 2 puffs into the lungs 2 (two) times daily., Disp: 1 each, Rfl: 0   magic mouthwash (nystatin , lidocaine , diphenhydrAMINE, alum & mag hydroxide) suspension, Swish and swallow 5 mLs 3 (three) times daily as needed for mouth pain., Disp: 180 mL, Rfl: 0   Spacer/Aero-Holding Chambers DEVI, 1 each by Does not apply route as needed., Disp: 1 each, Rfl: 0   ALPRAZolam  (XANAX ) 0.25 MG tablet, Take 1 tablet (0.25 mg total) by mouth 2 (two) times daily as needed for anxiety., Disp: 60  tablet, Rfl: 5   amLODipine  (NORVASC ) 5 MG tablet, Take 1 tablet (5 mg total) by mouth daily. (Patient not taking: Reported on 10/27/2024), Disp: 90 tablet, Rfl: 3   Biotin 5 MG CAPS, Take 1 capsule by mouth daily., Disp: , Rfl:    Calcium  Citrate-Vitamin D  250-200 MG-UNIT TABS, Take 1 tablet by mouth 2 (two) times daily., Disp: , Rfl:    esomeprazole  (NEXIUM ) 40 MG capsule, Take 1 capsule (40 mg total) by mouth daily. (Patient taking differently: Take 40 mg by mouth daily as needed.), Disp: 30 capsule, Rfl: 0   Estradiol 4 MCG INST, , Disp: , Rfl:    Evolocumab  (REPATHA  SURECLICK) 140 MG/ML SOAJ, Inject 140 mg into the skin every 14 (fourteen) days., Disp: 6 mL, Rfl: 3   fluticasone  (FLONASE ) 50 MCG/ACT nasal spray, Place 2 sprays into both nostrils daily. (Patient taking differently: Place 2 sprays into both nostrils daily as needed.), Disp: 16 g, Rfl: 0   Glucosamine Sulfate 1000 MG CAPS, Take 1 capsule by mouth daily. , Disp: , Rfl:    irbesartan  (AVAPRO ) 150 MG tablet, Take 1 tablet (150 mg total) by mouth daily., Disp: 90 tablet, Rfl: 3  Omega-3 Fatty Acids (FISH OIL PO), Take 2,000 mg by mouth 2 (two) times daily., Disp: , Rfl:    ondansetron  (ZOFRAN ) 4 MG tablet, Take 1 tablet (4 mg total) by mouth every 8 (eight) hours as needed for nausea or vomiting., Disp: 30 tablet, Rfl: 1   raloxifene (EVISTA) 60 MG tablet, Take 60 mg by mouth daily., Disp: , Rfl:    RETIN-A 0.05 % cream, Apply 1 Application topically at bedtime., Disp: , Rfl:    sucralfate  (CARAFATE ) 1 g tablet, Take 1 tablet (1 g total) by mouth 3 (three) times daily as needed., Disp: 90 tablet, Rfl: 3   valACYclovir  (VALTREX ) 1000 MG tablet, Take 1 tablet by mouth twice a day for 3 days then as needed (Patient taking differently: daily as needed.), Disp: 30 tablet, Rfl: 3  "

## 2024-12-10 NOTE — Telephone Encounter (Signed)
 FYI Only or Action Required?: FYI only for provider: appointment scheduled on 12/10/24 via virtual UC.  Patient was last seen in primary care on 10/03/2023 by Elnor Lauraine BRAVO, NP.  Called Nurse Triage reporting Cough.  Symptoms began several days ago.  Interventions attempted: Rest, hydration, or home remedies.  Symptoms are: gradually worsening.  Triage Disposition: See Physician Within 24 Hours  Patient/caregiver understands and will follow disposition?: Yes    Copied from CRM #[id number]. Topic: Clinical - Red Word Triage >> Dec 10, 2024 11:09 AM Alfonso ORN wrote: Red Word that prompted transfer to Nurse Triage: pt exp cough, congested , chest pressure, heavy breathing past 3 days   Reason for Disposition  [1] Continuous (nonstop) coughing interferes with work or school AND [2] no improvement using cough treatment per Care Advice  Answer Assessment - Initial Assessment Questions Pt called in requesting appt for dry cough with associated chest pressure with coughing spells and congestion x 3days. Pt has taken 3 flu tests, first test positive and other 2 negative. Pt unsure of cause of symptoms but states she feels congestion is worsening. Pt has not been to work for 3 days d/t symptoms. Offered appt tomorrow in clinic but pt states she was going to try to go to work tomorrow and prefers appt today. Discussed virtual appt due to URI symptoms and pt agreeable. Appointment scheduled for evaluation. Patient agrees with plan of care, and will call back if anything changes, or if symptoms worsen.      1. ONSET: When did the cough begin?      3 days ago   2. SEVERITY: How bad is the cough today?      Mod-severe   3. SPUTUM: Describe the color of your sputum (e.g., none, dry cough; clear, white, yellow, green)     Clear  4. HEMOPTYSIS: Are you coughing up any blood? If Yes, ask: How much? (e.g., flecks, streaks, tablespoons, etc.)     No   5. DIFFICULTY BREATHING: Are you  having difficulty breathing? If Yes, ask: How bad is it? (e.g., mild, moderate, severe)      Only with coughing spells   6. FEVER: Do you have a fever? If Yes, ask: What is your temperature, how was it measured, and when did it start?     Afebrile; pt reports fever with beginning of symptoms   10. OTHER SYMPTOMS: Do you have any other symptoms? (e.g., runny nose, wheezing, chest pain)       Chest discomfort with coughing spells, denies pain; no changes in breathing  Protocols used: Cough - Acute Non-Productive-A-AH

## 2024-12-10 NOTE — Patient Instructions (Addendum)
 " [name], thank you for joining Jon CHRISTELLA Belt, NP for today's virtual visit.  While this provider is not your primary care provider (PCP), if your PCP is located in our provider database this encounter information will be shared with them immediately following your visit.   A [facility] MyChart account gives you access to today's visit and all your visits, tests, and labs performed at [facility]  click here if you don't have a [facility] MyChart account or go to [email or web address]  Consent: (Patient) [name] provided verbal consent for this virtual visit at the beginning of the encounter.  Current Medications:  Current Outpatient Medications:    albuterol  (VENTOLIN  HFA) 108 (90 Base) MCG/ACT inhaler, Inhale 2 puffs into the lungs every 6 (six) hours as needed for wheezing or shortness of breath., Disp: 17 each, Rfl: 0   fluticasone  (FLOVENT  HFA) 44 MCG/ACT inhaler, Inhale 2 puffs into the lungs 2 (two) times daily., Disp: 1 each, Rfl: 0   magic mouthwash (nystatin , lidocaine , diphenhydrAMINE, alum & mag hydroxide) suspension, Swish and swallow 5 mLs 3 (three) times daily as needed for mouth pain., Disp: 180 mL, Rfl: 0   Spacer/Aero-Holding Chambers DEVI, 1 each by Does not apply route as needed., Disp: 1 each, Rfl: 0   ALPRAZolam  (XANAX ) 0.25 MG tablet, Take 1 tablet (0.25 mg total) by mouth 2 (two) times daily as needed for anxiety., Disp: 60 tablet, Rfl: 5   amLODipine  (NORVASC ) 5 MG tablet, Take 1 tablet (5 mg total) by mouth daily. (Patient not taking: Reported on 10/27/2024), Disp: 90 tablet, Rfl: 3   Biotin 5 MG CAPS, Take 1 capsule by mouth daily., Disp: , Rfl:    Calcium  Citrate-Vitamin D  250-200 MG-UNIT TABS, Take 1 tablet by mouth 2 (two) times daily., Disp: , Rfl:    esomeprazole  (NEXIUM ) 40 MG capsule, Take 1 capsule (40 mg total) by mouth daily. (Patient taking differently: Take 40 mg by mouth daily as needed.), Disp: 30 capsule, Rfl: 0    Estradiol 4 MCG INST, , Disp: , Rfl:    Evolocumab  (REPATHA  SURECLICK) 140 MG/ML SOAJ, Inject 140 mg into the skin every 14 (fourteen) days., Disp: 6 mL, Rfl: 3   fluticasone  (FLONASE ) 50 MCG/ACT nasal spray, Place 2 sprays into both nostrils daily. (Patient taking differently: Place 2 sprays into both nostrils daily as needed.), Disp: 16 g, Rfl: 0   Glucosamine Sulfate 1000 MG CAPS, Take 1 capsule by mouth daily. , Disp: , Rfl:    irbesartan  (AVAPRO ) 150 MG tablet, Take 1 tablet (150 mg total) by mouth daily., Disp: 90 tablet, Rfl: 3   Omega-3 Fatty Acids (FISH OIL PO), Take 2,000 mg by mouth 2 (two) times daily., Disp: , Rfl:    ondansetron  (ZOFRAN ) 4 MG tablet, Take 1 tablet (4 mg total) by mouth every 8 (eight) hours as needed for nausea or vomiting., Disp: 30 tablet, Rfl: 1   raloxifene (EVISTA) 60 MG tablet, Take 60 mg by mouth daily., Disp: , Rfl:    RETIN-A 0.05 % cream, Apply 1 Application topically at bedtime., Disp: , Rfl:    sucralfate  (CARAFATE ) 1 g tablet, Take 1 tablet (1 g total) by mouth 3 (three) times daily as needed., Disp: 90 tablet, Rfl: 3   valACYclovir  (VALTREX ) 1000 MG tablet, Take 1 tablet by mouth twice a day for 3 days then as needed (Patient taking differently: daily as needed.), Disp: 30 tablet, Rfl: 3   Medications ordered in this encounter:  Meds ordered this encounter  Medications   albuterol  (VENTOLIN  HFA) 108 (90 Base) MCG/ACT inhaler    Sig: Inhale 2 puffs into the lungs every 6 (six) hours as needed for wheezing or shortness of breath.    Dispense:  17 each    Refill:  0   Spacer/Aero-Holding Chambers DEVI    Sig: 1 each by Does not apply route as needed.    Dispense:  1 each    Refill:  0   fluticasone  (FLOVENT  HFA) 44 MCG/ACT inhaler    Sig: Inhale 2 puffs into the lungs 2 (two) times daily.    Dispense:  1 each    Refill:  0   magic mouthwash (nystatin , lidocaine , diphenhydrAMINE, alum & mag hydroxide) suspension    Sig: Swish and swallow 5 mLs 3  (three) times daily as needed for mouth pain.    Dispense:  180 mL    Refill:  0     *If you need refills on other medications prior to your next appointment, please contact your pharmacy*  Follow-Up: Call back or seek an in-person evaluation if the symptoms worsen or if the condition fails to improve as anticipated.  [facility] [phone]  Other Instructions  I recommend adding Mucinex (or generic guiafenesin) and saline nasal spray.   Do not use Afrin for more than 3 days during this illness.   Use the albuterol  inhaler 2 puffs every 4-6 hours if needed for wheezing or shortness of breath. Use it with the spacer,   Use the fluticasone  inhaler 2 puffs twice a day with the spacer even if your breathing is fine while you are sick. You can stop using it when you're feeling completely recovered. Rinse your mouth out after using it. Rinse the spacer out.    If you have been instructed to have an in-person evaluation today at a local Urgent Care facility, please use the link below. It will take you to a list of all of our available [facility], including address, phone number and hours of operation. Please do not delay care.  [facility]  If you or a family member do not have a primary care provider, use the link below to schedule a visit and establish care. When you choose a [facility] primary care physician or advanced practice provider, you gain a long-term partner in health. Find a Primary Care Provider  Learn more about [facility]'s in-office and virtual care options: [facility] Get Care Now  "

## 2024-12-16 ENCOUNTER — Encounter: Payer: Self-pay | Admitting: Internal Medicine

## 2024-12-16 ENCOUNTER — Telehealth: Payer: Self-pay

## 2024-12-16 ENCOUNTER — Ambulatory Visit: Payer: Self-pay | Admitting: Internal Medicine

## 2024-12-16 ENCOUNTER — Ambulatory Visit (INDEPENDENT_AMBULATORY_CARE_PROVIDER_SITE_OTHER): Admitting: Internal Medicine

## 2024-12-16 ENCOUNTER — Ambulatory Visit: Payer: Self-pay

## 2024-12-16 ENCOUNTER — Ambulatory Visit (INDEPENDENT_AMBULATORY_CARE_PROVIDER_SITE_OTHER)

## 2024-12-16 VITALS — BP 136/84 | HR 76 | Temp 98.2°F | Ht 64.0 in | Wt 118.2 lb

## 2024-12-16 DIAGNOSIS — J069 Acute upper respiratory infection, unspecified: Secondary | ICD-10-CM

## 2024-12-16 DIAGNOSIS — R051 Acute cough: Secondary | ICD-10-CM

## 2024-12-16 MED ORDER — PROMETHAZINE-DM 6.25-15 MG/5ML PO SYRP: 5.0000 mL | ORAL_SOLUTION | Freq: Four times a day (QID) | ORAL | 0 refills | Status: AC | PRN

## 2024-12-16 MED ORDER — FLUCONAZOLE 150 MG PO TABS: 150.0000 mg | ORAL_TABLET | Freq: Once | ORAL | 0 refills | Status: AC

## 2024-12-16 MED ORDER — CEFDINIR 300 MG PO CAPS: 300.0000 mg | ORAL_CAPSULE | Freq: Two times a day (BID) | ORAL | 0 refills | Status: AC

## 2024-12-16 NOTE — Assessment & Plan Note (Signed)
 Probable CAP Obtain CXR Given Omnicef , Prom cough syr,  Diflucan  prn

## 2024-12-16 NOTE — Telephone Encounter (Signed)
 FYI Only or Action Required?: FYI only for provider: appointment scheduled on 1/13.  Patient was last seen in primary care on 12/10/2024 by Richad Jon HERO, NP.  Called Nurse Triage reporting Influenza.  Symptoms began a week ago.  Interventions attempted: Rest, hydration, or home remedies.  Symptoms are: gradually worsening.  Triage Disposition: Call PCP Within 24 Hours  Copied from CRM #[id number]. Topic: Clinical - Red Word Triage >> Dec 16, 2024  9:08 AM Eva FALCON wrote: Red Word that prompted transfer to Nurse Triage: was given medication for flu and bronchitis is still coughing, weak as water, dizzy, overall not getting any better. Reason for Disposition  [1] HIGH RISK  (e.g., weak immune system, 65 years and older, obesity with BMI 40 or higher, pregnant, chronic lung disease) AND [2] evaluated by doctor (or NP/PA) > 72 hours (3 days) ago AND [3] symptoms not BETTER (improved)  Answer Assessment - Initial Assessment Questions 1. DIAGNOSIS CONFIRMATION: When was the influenza diagnosed? By whom? Did you get a test for it?     Last week was dx by PA 2. INFLUENZA MEDICINES: Were you prescribed any medicines for the influenza?  (e.g., zanamivir [Relenza], oseltamivir  [Tamiflu ]).      Inhalers 3. SYMPTOMS: What is your main symptom or concern? (e.g., cough, fever, shortness of breath, muscle aches)     Weakness, cough 4. ONSET: When did the symptoms start?      Ongoing, over a week,  5. COUGH: Do you have a cough? If Yes, ask: How bad is the cough?       Still coughing, but not breaking up the cough,  6. FEVER: Do you have a fever? If Yes, ask: What is your temperature, how was it measured, and when did it start?     denies 7. BREATHING DIFFICULTY: Are you having any difficulty breathing? (e.g., normal; shortness of breath, wheezing, unable to speak)      denies 9. HIGH RISK FOR COMPLICATIONS: Do you have any chronic medical problems? (e.g., asthma, heart or  lung disease, obesity, weak immune system)     denies  Protocols used: Influenza (Flu) Follow-up Call-A-AH

## 2024-12-16 NOTE — Telephone Encounter (Signed)
 Patient scheduled.

## 2024-12-16 NOTE — Assessment & Plan Note (Signed)
 Probable CAP related. Given MDIs prior Obtain CXR Given Omnicef , Prom cough syr,  Diflucan  prn

## 2024-12-16 NOTE — Telephone Encounter (Signed)
 Received paperwork for FMLA  Contacted pt in regards to reason for FMLA, pt states she has been out of work since 12/06/24 for the flu. She had a mychart visit on 12/10/24 and an in office visit today (12/16/24) w Dr. Garald due to continued chest congestion. Xray was ordered. Pt is requesting paperwork filed out for start date of 12/06/24 - ?(doesn't want to return this week).   Forms given to Dr. Rollene for completion. Forms completed and signed. Faxed forms to number provided.  LM for pt forms have been completed w an end date of 12/20/24. Provided office number for questions/concerns.

## 2024-12-16 NOTE — Progress Notes (Signed)
 "  Subjective:  Patient ID: [name], female    DOB: 05-03-1956  Age: 69 y.o. MRN: [id number]  CC: Influenza (Recent flu diagnosis, increased weakness, symptoms not alleviating)   HPI [name] presents for URI since 12/06/24. Not better.   Outpatient Medications Prior to Visit  Medication Sig Dispense Refill   albuterol  (VENTOLIN  HFA) 108 (90 Base) MCG/ACT inhaler Inhale 2 puffs into the lungs every 6 (six) hours as needed for wheezing or shortness of breath. 17 each 0   ALPRAZolam  (XANAX ) 0.25 MG tablet Take 1 tablet (0.25 mg total) by mouth 2 (two) times daily as needed for anxiety. 60 tablet 5   Biotin 5 MG CAPS Take 1 capsule by mouth daily.     Calcium  Citrate-Vitamin D  250-200 MG-UNIT TABS Take 1 tablet by mouth 2 (two) times daily.     esomeprazole  (NEXIUM ) 40 MG capsule Take 1 capsule (40 mg total) by mouth daily. (Patient taking differently: Take 40 mg by mouth daily as needed.) 30 capsule 0   Evolocumab  (REPATHA  SURECLICK) 140 MG/ML SOAJ Inject 140 mg into the skin every 14 (fourteen) days. 6 mL 3   fluticasone  (FLONASE ) 50 MCG/ACT nasal spray Place 2 sprays into both nostrils daily. (Patient taking differently: Place 2 sprays into both nostrils daily as needed.) 16 g 0   fluticasone  (FLOVENT  HFA) 44 MCG/ACT inhaler Inhale 2 puffs into the lungs 2 (two) times daily. 1 each 0   Glucosamine Sulfate 1000 MG CAPS Take 1 capsule by mouth daily.      irbesartan  (AVAPRO ) 150 MG tablet Take 1 tablet (150 mg total) by mouth daily. 90 tablet 3   magic mouthwash (nystatin , lidocaine , diphenhydrAMINE, alum & mag hydroxide) suspension Swish and swallow 5 mLs 3 (three) times daily as needed for mouth pain. 180 mL 0   Omega-3 Fatty Acids (FISH OIL PO) Take 2,000 mg by mouth 2 (two) times daily.     ondansetron  (ZOFRAN ) 4 MG tablet Take 1 tablet (4 mg total) by mouth every 8 (eight) hours as needed for nausea or vomiting. 30 tablet 1   raloxifene (EVISTA) 60 MG tablet Take 60 mg by mouth  daily.     RETIN-A 0.05 % cream Apply 1 Application topically at bedtime.     Spacer/Aero-Holding Chambers DEVI 1 each by Does not apply route as needed. 1 each 0   sucralfate  (CARAFATE ) 1 g tablet Take 1 tablet (1 g total) by mouth 3 (three) times daily as needed. 90 tablet 3   valACYclovir  (VALTREX ) 1000 MG tablet Take 1 tablet by mouth twice a day for 3 days then as needed (Patient taking differently: daily as needed.) 30 tablet 3   amLODipine  (NORVASC ) 5 MG tablet Take 1 tablet (5 mg total) by mouth daily. (Patient not taking: Reported on 12/16/2024) 90 tablet 3   Estradiol 4 MCG INST  (Patient not taking: Reported on 12/16/2024)     No facility-administered medications prior to visit.    ROS: Review of Systems  Objective:  BP 136/84   Pulse 76   Temp 98.2 F (36.8 C)   Ht 5' 4 (1.626 m)   Wt 118 lb 3.2 oz (53.6 kg)   SpO2 98%   BMI 20.29 kg/m   BP Readings from Last 3 Encounters:  12/16/24 136/84  10/27/24 124/79  08/21/24 110/78    Wt Readings from Last 3 Encounters:  12/16/24 118 lb 3.2 oz (53.6 kg)  10/27/24 122 lb (55.3 kg)  08/21/24 123  lb 4 oz (55.9 kg)    Physical Exam  Lab Results  Component Value Date   WBC 6.3 12/29/2023   HGB 14.6 12/29/2023   HCT 42.8 12/29/2023   PLT 318 12/29/2023   GLUCOSE 99 12/29/2023   CHOL 194 10/27/2024   TRIG 60 10/27/2024   HDL 53 10/27/2024   LDLCALC 130 (H) 10/27/2024   ALT 17 02/07/2024   AST 22 02/07/2024   NA 139 12/29/2023   K 3.8 12/29/2023   CL 106 12/29/2023   CREATININE 0.83 12/29/2023   BUN 17 12/29/2023   CO2 24 12/29/2023   TSH 3.01 03/26/2023    CT CARDIAC SCORING (SELF PAY ONLY) Addendum Date: 08/22/2024 ADDENDUM REPORT: 08/22/2024 12:38 EXAM: OVER-READ INTERPRETATION  CT CHEST The following report is an over-read performed by radiologist Dr. Fonda Mom [facility] on 08/22/2024. This over-read does not include interpretation of cardiac or coronary anatomy or pathology. The  coronary calcium  score interpretation by the cardiologist is attached. COMPARISON:  None. FINDINGS: Cardiovascular:  See findings discussed in the body of the report. Mediastinum/Nodes: No suspicious adenopathy identified. Imaged mediastinal structures are unremarkable. Lungs/Pleura: Imaged lungs are clear. No pleural effusion or pneumothorax. Upper Abdomen: No acute abnormality. Musculoskeletal: No chest wall abnormality. No acute osseous findings. IMPRESSION: No acute extracardiac incidental findings. Electronically Signed   By: Fonda Field M.D.   On: 08/22/2024 12:38   Result Date: 08/22/2024 CLINICAL DATA:  39F for cardiovascular disease risk stratification EXAM: Coronary Calcium  Score TECHNIQUE: A gated, non-contrast computed tomography scan of the heart was performed using 3mm slice thickness. Axial images were analyzed on a dedicated workstation. Calcium  scoring of the coronary arteries was performed using the Agatston method. FINDINGS: Coronary Calcium  Score: Left main: 6.71 Left anterior descending artery: 42.1 Left circumflex artery: 5.19 Right coronary artery: 0 Total: 54 Percentile: 68th Aortic atherosclerosis. Pericardium: Normal. Non-cardiac: See separate report from [facility]. IMPRESSION: 1. Coronary calcium  score of 54. This was 68th percentile for age-, race-, and sex-matched controls. 2. Aortic atherosclerosis. RECOMMENDATIONS: Coronary artery calcium  (CAC) score is a strong predictor of incident coronary heart disease (CHD) and provides predictive information beyond traditional risk factors. CAC scoring is reasonable to use in the decision to withhold, postpone, or initiate statin therapy in intermediate-risk or selected borderline-risk asymptomatic adults (age 37-75 years and LDL-C >=70 to <190 mg/dL) who do not have diabetes or established atherosclerotic cardiovascular disease (ASCVD).* In intermediate-risk (10-year ASCVD risk >=7.5% to <20%) adults or selected borderline-risk  (10-year ASCVD risk >=5% to <7.5%) adults in whom a CAC score is measured for the purpose of making a treatment decision the following recommendations have been made: If CAC=0, it is reasonable to withhold statin therapy and reassess in 5 to 10 years, as long as higher risk conditions are absent (diabetes mellitus, family history of premature CHD in first degree relatives (males <55 years; females <65 years), cigarette smoking, or LDL >=190 mg/dL). If CAC is 1 to 99, it is reasonable to initiate statin therapy for patients >=57 years of age. If CAC is >=100 or >=75th percentile, it is reasonable to initiate statin therapy at any age. Cardiology referral should be considered for patients with CAC scores >=400 or >=75th percentile. *2018 AHA/ACC/AACVPR/AAPA/ABC/ACPM/ADA/AGS/APhA/ASPC/NLA/PCNA Guideline on the Management of Blood Cholesterol: A Report of the American College of Cardiology/American Heart Association Task Force on Clinical Practice Guidelines. J Am Coll Cardiol. 2019;73(24):3168-3209. Annabella Scarce, MD Electronically Signed: By: Annabella Scarce M.D. On: 08/19/2024 10:57  Assessment & Plan:   Problem List Items Addressed This Visit     Cough   Probable CAP related. Given MDIs prior Obtain CXR Given Omnicef , Prom cough syr,  Diflucan  prn      Upper respiratory infection - Primary   Probable CAP Obtain CXR Given Omnicef , Prom cough syr,  Diflucan  prn      Relevant Medications   fluconazole  (DIFLUCAN ) 150 MG tablet   cefdinir  (OMNICEF ) 300 MG capsule   Other Relevant Orders   DG Chest 2 View      Meds ordered this encounter  Medications   fluconazole  (DIFLUCAN ) 150 MG tablet    Sig: Take 1 tablet (150 mg total) by mouth once for 1 dose. Take one prn    Dispense:  3 tablet    Refill:  0   promethazine -dextromethorphan (PROMETHAZINE -DM) 6.25-15 MG/5ML syrup    Sig: Take 5 mLs by mouth 4 (four) times daily as needed for cough.    Dispense:  240 mL    Refill:  0    cefdinir  (OMNICEF ) 300 MG capsule    Sig: Take 1 capsule (300 mg total) by mouth 2 (two) times daily.    Dispense:  20 capsule    Refill:  0      Follow-up: No follow-ups on file.  Marolyn Noel, MD "

## 2024-12-17 ENCOUNTER — Ambulatory Visit: Admitting: Pharmacist Clinician (PhC)/ Clinical Pharmacy Specialist

## 2024-12-22 ENCOUNTER — Ambulatory Visit: Payer: Self-pay

## 2024-12-22 NOTE — Telephone Encounter (Signed)
" °  FYI Only or Action Required?: Action required by provider: request for documentation or forms.  Patient was last seen in primary care on 12/16/2024 by Plotnikov, Karlynn GAILS, MD.  Called Nurse Triage reporting Fatigue.  Symptoms began several days ago.  Interventions attempted: Prescription medications: all prescribed meds.  Symptoms are: stable.  Triage Disposition: Home Care  Patient/caregiver understands and will follow disposition?: Yes   Reason for Triage: Pt has been ill for a couple weeks, weak and not getting any better has had pneumonia   Reason for Disposition  Mild weakness or fatigue with acute minor illness (e.g., colds)  Answer Assessment - Initial Assessment Questions Patients still not feeling well. Requesting documentation to further sick leave at work.  Requesting call back to home phone [phone]   1. DESCRIPTION: Describe how you are feeling.     tired 2. SEVERITY: How bad is it?  Can you stand and walk?     yes 3. ONSET: When did these symptoms begin? (e.g., hours, days, weeks, months)     day 4. CAUSE: What do you think is causing the weakness or fatigue? (e.g., not drinking enough fluids, medical problem, trouble sleeping)     pneumonia 5. NEW MEDICINES:  Have you started on any new medicines recently? (e.g., opioid pain medicines, benzodiazepines, muscle relaxants, antidepressants, antihistamines, neuroleptics, beta blockers)      6. OTHER SYMPTOMS: Do you have any other symptoms? (e.g., chest pain, fever, cough, SOB, vomiting, diarrhea, bleeding, other areas of pain)     Denies sob, cp or worsening symptoms since last visit  Protocols used: Weakness (Generalized) and Fatigue-A-AH  "

## 2024-12-23 NOTE — Telephone Encounter (Signed)
 Would need follow up visit I have not seen her for this and would not do > 2 weeks FMLA for a viral illness.

## 2024-12-24 ENCOUNTER — Ambulatory Visit: Admitting: Family Medicine

## 2024-12-24 ENCOUNTER — Encounter: Payer: Self-pay | Admitting: Family Medicine

## 2024-12-24 ENCOUNTER — Ambulatory Visit: Payer: Self-pay | Admitting: Family Medicine

## 2024-12-24 ENCOUNTER — Ambulatory Visit (INDEPENDENT_AMBULATORY_CARE_PROVIDER_SITE_OTHER)

## 2024-12-24 VITALS — BP 132/84 | HR 62 | Temp 97.8°F | Ht 64.0 in | Wt 120.0 lb

## 2024-12-24 DIAGNOSIS — R197 Diarrhea, unspecified: Secondary | ICD-10-CM

## 2024-12-24 DIAGNOSIS — R052 Subacute cough: Secondary | ICD-10-CM

## 2024-12-24 DIAGNOSIS — R5383 Other fatigue: Secondary | ICD-10-CM

## 2024-12-24 DIAGNOSIS — I10 Essential (primary) hypertension: Secondary | ICD-10-CM

## 2024-12-24 DIAGNOSIS — R42 Dizziness and giddiness: Secondary | ICD-10-CM

## 2024-12-24 DIAGNOSIS — Z8709 Personal history of other diseases of the respiratory system: Secondary | ICD-10-CM

## 2024-12-24 LAB — CBC WITH DIFFERENTIAL/PLATELET
Basophils Absolute: 0.1 K/uL (ref 0.0–0.1)
Basophils Relative: 1.1 % (ref 0.0–3.0)
Eosinophils Absolute: 0.2 K/uL (ref 0.0–0.7)
Eosinophils Relative: 3.2 % (ref 0.0–5.0)
HCT: 42.9 % (ref 36.0–46.0)
Hemoglobin: 14.5 g/dL (ref 12.0–15.0)
Lymphocytes Relative: 26.9 % (ref 12.0–46.0)
Lymphs Abs: 1.9 K/uL (ref 0.7–4.0)
MCHC: 33.7 g/dL (ref 30.0–36.0)
MCV: 87.8 fl (ref 78.0–100.0)
Monocytes Absolute: 0.4 K/uL (ref 0.1–1.0)
Monocytes Relative: 5.6 % (ref 3.0–12.0)
Neutro Abs: 4.6 K/uL (ref 1.4–7.7)
Neutrophils Relative %: 63.2 % (ref 43.0–77.0)
Platelets: 411 K/uL — ABNORMAL HIGH (ref 150.0–400.0)
RBC: 4.88 Mil/uL (ref 3.87–5.11)
RDW: 13.5 % (ref 11.5–15.5)
WBC: 7.2 K/uL (ref 4.0–10.5)

## 2024-12-24 LAB — COMPREHENSIVE METABOLIC PANEL WITH GFR
ALT: 9 U/L (ref 3–35)
AST: 16 U/L (ref 5–37)
Albumin: 4.4 g/dL (ref 3.5–5.2)
Alkaline Phosphatase: 70 U/L (ref 39–117)
BUN: 22 mg/dL (ref 6–23)
CO2: 26 meq/L (ref 19–32)
Calcium: 9.5 mg/dL (ref 8.4–10.5)
Chloride: 107 meq/L (ref 96–112)
Creatinine, Ser: 0.95 mg/dL (ref 0.40–1.20)
GFR: 61.43 mL/min
Glucose, Bld: 98 mg/dL (ref 70–99)
Potassium: 3.5 meq/L (ref 3.5–5.1)
Sodium: 141 meq/L (ref 135–145)
Total Bilirubin: 1.5 mg/dL — ABNORMAL HIGH (ref 0.2–1.2)
Total Protein: 7.4 g/dL (ref 6.0–8.3)

## 2024-12-24 NOTE — Patient Instructions (Signed)
 Please go downstairs for labs and a chest x-ray before you leave.  Continue treating your symptoms, hydrating and building up your physical stamina as tolerated.  Your FMLA paperwork will be extended until Monday, January 26

## 2024-12-24 NOTE — Progress Notes (Signed)
 Subjective:    Discussed the use of AI scribe software for clinical note transcription with the patient, who gave verbal consent to proceed.  History of Present Illness [name] is a 69 year old female who presents with upper respiratory symptoms and fatigue following a recent influenza infection.  Upper respiratory symptoms - Developed upper respiratory symptoms after influenza diagnosed on January 3 - Treated on January 13 for possible pneumonia with cefdinir  and promethazine  DM - Wheezing has nearly resolved - Cough has improved - Continues albuterol  and steroid inhaler  Fatigue and decreased stamina - Marked fatigue and low stamina persist - Dizziness when standing, attributed to weakness from being bedridden - Concerned about insufficient energy to return to work  Antibiotic-associated diarrhea - Developed diarrhea on 2-3 days ago after being on cefdinir  - Diarrhea worsened prompting discontinuation of cefdinir  - Diarrhea has improved since stopping the antibiotic    ROS as in subjective.   Objective: Vitals:   12/24/24 1048  BP: 132/84  Pulse: 62  Temp: 97.8 F (36.6 C)  SpO2: 99%    General appearance: Alert, WD/WN, no distress, mildly ill appearing                             Skin: warm, no rash                           Head: no sinus tenderness                            Eyes: conjunctiva normal, corneas clear, PERRLA                            Ears: pearly TMs, external ear canals normal                             Neck: supple, no adenopathy, no thyromegaly, nontender                          Heart: RRR                         Lungs: RLL with course lung sounds, no wheezes Abdomen: soft, normal BS, TTP throughout to light palpation  Extremities: no edema.       Assessment/Plan:  Assessment and Plan Assessment & Plan Acute respiratory illness post-influenza Post-influenza respiratory symptoms with mild pulmonary hyperinflation on chest x-ray.  No acute findings or pneumonia. Symptoms include wheezing and cough, which are improving. No need for steroids at this time. - Ordered chest x-ray to reassess lung status - Continue albuterol  inhaler as needed for wheezing  Acute diarrhea Diarrhea started on Monday, initially severe but now improving. No current medication use for diarrhea. - Ordered blood work to check electrolytes and white blood cell count  Fatigue Persistent fatigue and lack of stamina following influenza infection. Likely due to recent illness and recovery process. No signs of acute underlying condition. - Ordered blood work to assess for any underlying issues - Advised rest and gradual return to normal activities - Extended FMLA paperwork until Monday, January 26th, to allow for recovery per Dr. Rollene, PCP

## 2024-12-24 NOTE — Telephone Encounter (Signed)
 Pt scheduled for Acute visit with Vickie, NP-C today at 11am.

## 2025-02-03 ENCOUNTER — Ambulatory Visit
